# Patient Record
Sex: Male | Born: 1960
Health system: Southern US, Community
[De-identification: ages and names within clinical notes are randomized; demographics above are authoritative.]

## PROBLEM LIST (undated history)

## (undated) DIAGNOSIS — I255 Ischemic cardiomyopathy: Secondary | ICD-10-CM

## (undated) DIAGNOSIS — I10 Essential (primary) hypertension: Secondary | ICD-10-CM

## (undated) DIAGNOSIS — Z951 Presence of aortocoronary bypass graft: Secondary | ICD-10-CM

## (undated) DIAGNOSIS — E785 Hyperlipidemia, unspecified: Secondary | ICD-10-CM

## (undated) DIAGNOSIS — Z720 Tobacco use: Secondary | ICD-10-CM

## (undated) DIAGNOSIS — G4730 Sleep apnea, unspecified: Secondary | ICD-10-CM

## (undated) DIAGNOSIS — I2129 ST elevation (STEMI) myocardial infarction involving other sites: Secondary | ICD-10-CM

## (undated) DIAGNOSIS — I25110 Atherosclerotic heart disease of native coronary artery with unstable angina pectoris: Secondary | ICD-10-CM

## (undated) HISTORY — DX: Sleep apnea, unspecified: G47.30

## (undated) HISTORY — PX: HERNIA REPAIR: SHX51

## (undated) HISTORY — DX: Atherosclerotic heart disease of native coronary artery with unstable angina pectoris: I25.110

## (undated) HISTORY — DX: Ischemic cardiomyopathy: I25.5

## (undated) HISTORY — PX: KNEE SURGERY: SHX244

## (undated) HISTORY — DX: ST elevation (STEMI) myocardial infarction involving other sites: I21.29

## (undated) HISTORY — PX: TRANSTHORACIC ECHOCARDIOGRAM: SHX275

## (undated) HISTORY — DX: Essential (primary) hypertension: I10

## (undated) HISTORY — DX: Presence of aortocoronary bypass graft: Z95.1

---

## 2006-02-15 ENCOUNTER — Ambulatory Visit: Payer: Self-pay | Admitting: Family Medicine

## 2014-03-03 DIAGNOSIS — J309 Allergic rhinitis, unspecified: Secondary | ICD-10-CM | POA: Insufficient documentation

## 2017-03-19 ENCOUNTER — Ambulatory Visit: Payer: Self-pay | Admitting: Emergency Medicine

## 2017-03-23 ENCOUNTER — Encounter: Payer: Self-pay | Admitting: Family

## 2017-03-23 ENCOUNTER — Ambulatory Visit (INDEPENDENT_AMBULATORY_CARE_PROVIDER_SITE_OTHER): Payer: BLUE CROSS/BLUE SHIELD | Admitting: Family

## 2017-03-23 VITALS — BP 202/124 | HR 91 | Temp 98.5°F | Ht 65.0 in | Wt 236.4 lb

## 2017-03-23 DIAGNOSIS — E669 Obesity, unspecified: Secondary | ICD-10-CM

## 2017-03-23 DIAGNOSIS — I10 Essential (primary) hypertension: Secondary | ICD-10-CM

## 2017-03-23 MED ORDER — CLONIDINE HCL 0.1 MG PO TABS
0.2000 mg | ORAL_TABLET | Freq: Once | ORAL | Status: AC
  Administered 2017-03-23: 0.2 mg via ORAL

## 2017-03-23 MED ORDER — LISINOPRIL-HYDROCHLOROTHIAZIDE 20-12.5 MG PO TABS: 1.0000 | ORAL_TABLET | Freq: Every day | ORAL | 3 refills | Status: DC

## 2017-03-23 NOTE — Patient Instructions (Addendum)
 Hypertension Hypertension, commonly called high blood pressure, is when the force of blood pumping through the arteries is too strong. The arteries are the blood vessels that carry blood from the heart throughout the body. Hypertension forces the heart to work harder to pump blood and may cause arteries to become narrow or stiff. Having untreated or uncontrolled hypertension can cause heart attacks, strokes, kidney disease, and other problems. A blood pressure reading consists of a higher number over a lower number. Ideally, your blood pressure should be below 120/80. The first ("top") number is called the systolic pressure. It is a measure of the pressure in your arteries as your heart beats. The second ("bottom") number is called the diastolic pressure. It is a measure of the pressure in your arteries as the heart relaxes. What are the causes? The cause of this condition is not known. What increases the risk? Some risk factors for high blood pressure are under your control. Others are not. Factors you can change  Smoking.  Having type 2 diabetes mellitus, high cholesterol, or both.  Not getting enough exercise or physical activity.  Being overweight.  Having too much fat, sugar, calories, or salt (sodium) in your diet.  Drinking too much alcohol. Factors that are difficult or impossible to change  Having chronic kidney disease.  Having a family history of high blood pressure.  Age. Risk increases with age.  Race. You may be at higher risk if you are African-American.  Gender. Men are at higher risk than women before age 45. After age 65, women are at higher risk than men.  Having obstructive sleep apnea.  Stress. What are the signs or symptoms? Extremely high blood pressure (hypertensive crisis) may cause:  Headache.  Anxiety.  Shortness of breath.  Nosebleed.  Nausea and vomiting.  Severe chest pain.  Jerky movements you cannot control (seizures).  How is  this diagnosed? This condition is diagnosed by measuring your blood pressure while you are seated, with your arm resting on a surface. The cuff of the blood pressure monitor will be placed directly against the skin of your upper arm at the level of your heart. It should be measured at least twice using the same arm. Certain conditions can cause a difference in blood pressure between your right and left arms. Certain factors can cause blood pressure readings to be lower or higher than normal (elevated) for a short period of time:  When your blood pressure is higher when you are in a health care provider's office than when you are at home, this is called white coat hypertension. Most people with this condition do not need medicines.  When your blood pressure is higher at home than when you are in a health care provider's office, this is called masked hypertension. Most people with this condition may need medicines to control blood pressure.  If you have a high blood pressure reading during one visit or you have normal blood pressure with other risk factors:  You may be asked to return on a different day to have your blood pressure checked again.  You may be asked to monitor your blood pressure at home for 1 week or longer.  If you are diagnosed with hypertension, you may have other blood or imaging tests to help your health care provider understand your overall risk for other conditions. How is this treated? This condition is treated by making healthy lifestyle changes, such as eating healthy foods, exercising more, and reducing your alcohol intake.   Your health care provider may prescribe medicine if lifestyle changes are not enough to get your blood pressure under control, and if:  Your systolic blood pressure is above 130.  Your diastolic blood pressure is above 80.  Your personal target blood pressure may vary depending on your medical conditions, your age, and other factors. Follow these  instructions at home: Eating and drinking  Eat a diet that is high in fiber and potassium, and low in sodium, added sugar, and fat. An example eating plan is called the DASH (Dietary Approaches to Stop Hypertension) diet. To eat this way: ? Eat plenty of fresh fruits and vegetables. Try to fill half of your plate at each meal with fruits and vegetables. ? Eat whole grains, such as whole wheat pasta, brown rice, or whole grain bread. Fill about one quarter of your plate with whole grains. ? Eat or drink low-fat dairy products, such as skim milk or low-fat yogurt. ? Avoid fatty cuts of meat, processed or cured meats, and poultry with skin. Fill about one quarter of your plate with lean proteins, such as fish, chicken without skin, beans, eggs, and tofu. ? Avoid premade and processed foods. These tend to be higher in sodium, added sugar, and fat.  Reduce your daily sodium intake. Most people with hypertension should eat less than 1,500 mg of sodium a day.  Limit alcohol intake to no more than 1 drink a day for nonpregnant women and 2 drinks a day for men. One drink equals 12 oz of beer, 5 oz of wine, or 1 oz of hard liquor. Lifestyle  Work with your health care provider to maintain a healthy body weight or to lose weight. Ask what an ideal weight is for you.  Get at least 30 minutes of exercise that causes your heart to beat faster (aerobic exercise) most days of the week. Activities may include walking, swimming, or biking.  Include exercise to strengthen your muscles (resistance exercise), such as pilates or lifting weights, as part of your weekly exercise routine. Try to do these types of exercises for 30 minutes at least 3 days a week.  Do not use any products that contain nicotine or tobacco, such as cigarettes and e-cigarettes. If you need help quitting, ask your health care provider.  Monitor your blood pressure at home as told by your health care provider.  Keep all follow-up visits as  told by your health care provider. This is important. Medicines  Take over-the-counter and prescription medicines only as told by your health care provider. Follow directions carefully. Blood pressure medicines must be taken as prescribed.  Do not skip doses of blood pressure medicine. Doing this puts you at risk for problems and can make the medicine less effective.  Ask your health care provider about side effects or reactions to medicines that you should watch for. Contact a health care provider if:  You think you are having a reaction to a medicine you are taking.  You have headaches that keep coming back (recurring).  You feel dizzy.  You have swelling in your ankles.  You have trouble with your vision. Get help right away if:  You develop a severe headache or confusion.  You have unusual weakness or numbness.  You feel faint.  You have severe pain in your chest or abdomen.  You vomit repeatedly.  You have trouble breathing. Summary  Hypertension is when the force of blood pumping through your arteries is too strong. If this condition is   not controlled, it may put you at risk for serious complications.  Your personal target blood pressure may vary depending on your medical conditions, your age, and other factors. For most people, a normal blood pressure is less than 120/80.  Hypertension is treated with lifestyle changes, medicines, or a combination of both. Lifestyle changes include weight loss, eating a healthy, low-sodium diet, exercising more, and limiting alcohol. This information is not intended to replace advice given to you by your health care provider. Make sure you discuss any questions you have with your health care provider. Document Released: 08/21/2005 Document Revised: 07/19/2016 Document Reviewed: 07/19/2016 Elsevier Interactive Patient Education  2018 [employer]   DASH Eating Plan DASH stands for "Dietary Approaches to Stop Hypertension." The  DASH eating plan is a healthy eating plan that has been shown to reduce high blood pressure (hypertension). It may also reduce your risk for type 2 diabetes, heart disease, and stroke. The DASH eating plan may also help with weight loss. What are tips for following this plan? General guidelines  Avoid eating more than 2,300 mg (milligrams) of salt (sodium) a day. If you have hypertension, you may need to reduce your sodium intake to 1,500 mg a day.  Limit alcohol intake to no more than 1 drink a day for nonpregnant women and 2 drinks a day for men. One drink equals 12 oz of beer, 5 oz of wine, or 1 oz of hard liquor.  Work with your health care provider to maintain a healthy body weight or to lose weight. Ask what an ideal weight is for you.  Get at least 30 minutes of exercise that causes your heart to beat faster (aerobic exercise) most days of the week. Activities may include walking, swimming, or biking.  Work with your health care provider or diet and nutrition specialist (dietitian) to adjust your eating plan to your individual calorie needs. Reading food labels  Check food labels for the amount of sodium per serving. Choose foods with less than 5 percent of the Daily Value of sodium. Generally, foods with less than 300 mg of sodium per serving fit into this eating plan.  To find whole grains, look for the word "whole" as the first word in the ingredient list. Shopping  Buy products labeled as "low-sodium" or "no salt added."  Buy fresh foods. Avoid canned foods and premade or frozen meals. Cooking  Avoid adding salt when cooking. Use salt-free seasonings or herbs instead of table salt or sea salt. Check with your health care provider or pharmacist before using salt substitutes.  Do not fry foods. Cook foods using healthy methods such as baking, boiling, grilling, and broiling instead.  Cook with heart-healthy oils, such as olive, canola, soybean, or sunflower oil. Meal  planning   Eat a balanced diet that includes: ? 5 or more servings of fruits and vegetables each day. At each meal, try to fill half of your plate with fruits and vegetables. ? Up to 6-8 servings of whole grains each day. ? Less than 6 oz of lean meat, poultry, or fish each day. A 3-oz serving of meat is about the same size as a deck of cards. One egg equals 1 oz. ? 2 servings of low-fat dairy each day. ? A serving of nuts, seeds, or beans 5 times each week. ? Heart-healthy fats. Healthy fats called Omega-3 fatty acids are found in foods such as flaxseeds and coldwater fish, like sardines, salmon, and mackerel.  Limit how much you   eat of the following: ? Canned or prepackaged foods. ? Food that is high in trans fat, such as fried foods. ? Food that is high in saturated fat, such as fatty meat. ? Sweets, desserts, sugary drinks, and other foods with added sugar. ? Full-fat dairy products.  Do not salt foods before eating.  Try to eat at least 2 vegetarian meals each week.  Eat more home-cooked food and less restaurant, buffet, and fast food.  When eating at a restaurant, ask that your food be prepared with less salt or no salt, if possible. What foods are recommended? The items listed may not be a complete list. Talk with your dietitian about what dietary choices are best for you. Grains Whole-grain or whole-wheat bread. Whole-grain or whole-wheat pasta. Brown rice. Oatmeal. Quinoa. Bulgur. Whole-grain and low-sodium cereals. Pita bread. Low-fat, low-sodium crackers. Whole-wheat flour tortillas. Vegetables Fresh or frozen vegetables (raw, steamed, roasted, or grilled). Low-sodium or reduced-sodium tomato and vegetable juice. Low-sodium or reduced-sodium tomato sauce and tomato paste. Low-sodium or reduced-sodium canned vegetables. Fruits All fresh, dried, or frozen fruit. Canned fruit in natural juice (without added sugar). Meat and other protein foods Skinless chicken or turkey.  Ground chicken or turkey. Pork with fat trimmed off. Fish and seafood. Egg whites. Dried beans, peas, or lentils. Unsalted nuts, nut butters, and seeds. Unsalted canned beans. Lean cuts of beef with fat trimmed off. Low-sodium, lean deli meat. Dairy Low-fat (1%) or fat-free (skim) milk. Fat-free, low-fat, or reduced-fat cheeses. Nonfat, low-sodium ricotta or cottage cheese. Low-fat or nonfat yogurt. Low-fat, low-sodium cheese. Fats and oils Soft margarine without trans fats. Vegetable oil. Low-fat, reduced-fat, or light mayonnaise and salad dressings (reduced-sodium). Canola, safflower, olive, soybean, and sunflower oils. Avocado. Seasoning and other foods Herbs. Spices. Seasoning mixes without salt. Unsalted popcorn and pretzels. Fat-free sweets. What foods are not recommended? The items listed may not be a complete list. Talk with your dietitian about what dietary choices are best for you. Grains Baked goods made with fat, such as croissants, muffins, or some breads. Dry pasta or rice meal packs. Vegetables Creamed or fried vegetables. Vegetables in a cheese sauce. Regular canned vegetables (not low-sodium or reduced-sodium). Regular canned tomato sauce and paste (not low-sodium or reduced-sodium). Regular tomato and vegetable juice (not low-sodium or reduced-sodium). Pickles. Olives. Fruits Canned fruit in a light or heavy syrup. Fried fruit. Fruit in cream or butter sauce. Meat and other protein foods Fatty cuts of meat. Ribs. Fried meat. Bacon. Sausage. Bologna and other processed lunch meats. Salami. Fatback. Hotdogs. Bratwurst. Salted nuts and seeds. Canned beans with added salt. Canned or smoked fish. Whole eggs or egg yolks. Chicken or turkey with skin. Dairy Whole or 2% milk, cream, and half-and-half. Whole or full-fat cream cheese. Whole-fat or sweetened yogurt. Full-fat cheese. Nondairy creamers. Whipped toppings. Processed cheese and cheese spreads. Fats and oils Butter. Stick  margarine. Lard. Shortening. Ghee. Bacon fat. Tropical oils, such as coconut, palm kernel, or palm oil. Seasoning and other foods Salted popcorn and pretzels. Onion salt, garlic salt, seasoned salt, table salt, and sea salt. Worcestershire sauce. Tartar sauce. Barbecue sauce. Teriyaki sauce. Soy sauce, including reduced-sodium. Steak sauce. Canned and packaged gravies. Fish sauce. Oyster sauce. Cocktail sauce. Horseradish that you find on the shelf. Ketchup. Mustard. Meat flavorings and tenderizers. Bouillon cubes. Hot sauce and Tabasco sauce. Premade or packaged marinades. Premade or packaged taco seasonings. Relishes. Regular salad dressings. Where to find more information:  National Heart, Lung, and Blood Institute: www.nhlbi.nih.gov  American   Heart Association: www.heart.org Summary  The DASH eating plan is a healthy eating plan that has been shown to reduce high blood pressure (hypertension). It may also reduce your risk for type 2 diabetes, heart disease, and stroke.  With the DASH eating plan, you should limit salt (sodium) intake to 2,300 mg a day. If you have hypertension, you may need to reduce your sodium intake to 1,500 mg a day.  When on the DASH eating plan, aim to eat more fresh fruits and vegetables, whole grains, lean proteins, low-fat dairy, and heart-healthy fats.  Work with your health care provider or diet and nutrition specialist (dietitian) to adjust your eating plan to your individual calorie needs. This information is not intended to replace advice given to you by your health care provider. Make sure you discuss any questions you have with your health care provider. Document Released: 08/10/2011 Document Revised: 08/14/2016 Document Reviewed: 08/14/2016 Elsevier Interactive Patient Education  2017 Elsevier Inc.  

## 2017-03-23 NOTE — Progress Notes (Addendum)
Subjective:    Patient ID: Parker Sellers, male    DOB: 1961-07-07, 56 y.o.   MRN: [id number]  Pt presents to the office today to establish care. 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 PT went to the Urgent Care and was told he was getting a rx sent in for his HTN. PT states the only rx he picked up was Lipitor 40 mg. PT states he has been taking this daily, but his BP continues to be elevated.   According to pt's paperwork, Valsartan 80 mg prescription was suppose to be sent to pharmacy. PT states he never got this rx.  Hypertension  This is a recurrent problem. The current episode started more than 1 month ago. The problem has been waxing and waning since onset. The problem is uncontrolled. Associated symptoms include anxiety. Pertinent negatives include no headaches, malaise/fatigue, peripheral edema or shortness of breath. Risk factors for coronary artery disease include family history, obesity and sedentary lifestyle. The current treatment provides mild improvement. There is no history of kidney disease, CAD/MI, CVA or heart failure.      Review of Systems  Constitutional: Negative for malaise/fatigue.  Respiratory: Negative for shortness of breath.   Neurological: Negative for headaches.  All other systems reviewed and are negative.  Family History  Problem Relation Age of Onset  . Cancer Mother   . Hypertension Father   . Liver disease Father   . Alcohol abuse Father   . AAA (abdominal aortic aneurysm) Father     Social History   Social History  . Marital status: Single    Spouse name: N/A  . Number of children: N/A  . Years of education: N/A   Social History Main Topics  . Smoking status: Former Research scientist (life sciences)  . Smokeless tobacco: Current User    Types: Snuff  . Alcohol use 1.2 oz/week    2 Cans of beer per week  . Drug use: No  . Sexual activity: Yes   Other Topics Concern  . None   Social History Narrative  . None      Objective:   Physical Exam  Constitutional: He is oriented to person, place, and time. He appears well-developed and well-nourished. No distress.  HENT:  Head: Normocephalic.  Right Ear: External ear normal.  Left Ear: External ear normal.  Nose: Nose normal.  Mouth/Throat: Oropharynx is clear and moist.  Eyes: Pupils are equal, round, and reactive to light. Right eye exhibits no discharge. Left eye exhibits no discharge.  Neck: Normal range of motion. Neck supple. No thyromegaly present.  Cardiovascular: Normal rate, regular rhythm, normal heart sounds and intact distal pulses.   No murmur heard. Pulmonary/Chest: Effort normal and breath sounds normal. No respiratory distress. He has no wheezes.  Abdominal: Soft. Bowel sounds are normal. He exhibits no distension. There is no tenderness.  Musculoskeletal: Normal range of motion. He exhibits no edema or tenderness.  Neurological: He is alert and oriented to person, place, and time.  Skin: Skin is warm and dry. No rash noted. No erythema.  Psychiatric: He has a normal mood and affect. His behavior is normal. Judgment and thought content normal.  Vitals reviewed.    BP (!) 202/124   Pulse 91   Temp 98.5 F (36.9 C) (Oral)   Ht _0  (1.651 m)   Wt 236 lb 6.4 oz (107.2 kg)   BMI 39.34 kg/m      Assessment &  Plan:  1. Hypertension, unspecified type -Pt started on Zestoretic 20-12.5 mg daily today -Daily blood pressure log given with instructions on how to fill out and told to bring to next visit -Dash diet information given -Exercise encouraged - Stress Management  -Continue current meds -RTO in 2 week  - cloNIDine (CATAPRES) tablet 0.2 mg; Take 2 tablets (0.2 mg total) by mouth once. - lisinopril-hydrochlorothiazide (ZESTORETIC) 20-12.5 MG tablet; Take 1 tablet by mouth daily.  Dispense: 90 tablet; Refill: 3 - BMP8+EGFR  2. Obesity (BMI 30-39.9) - BMP8+EGFR   Pt denies any headache, SOB, chest pain , or edema. Pt  reports he is very anxious at [facility]". We will start Zestoretic today and have pt follow up on Monday. Go to ED with any chest pain, headache, SOB, or edema Pt had negative EKG at [facility], FNP

## 2017-03-24 LAB — BMP8+EGFR
BUN/Creatinine Ratio: 12 (ref 9–20)
BUN: 17 mg/dL (ref 6–24)
CO2: 24 mmol/L (ref 20–29)
Calcium: 9.4 mg/dL (ref 8.7–10.2)
Chloride: 100 mmol/L (ref 96–106)
Creatinine, Ser: 1.42 mg/dL — ABNORMAL HIGH (ref 0.76–1.27)
GFR calc Af Amer: 63 mL/min/{1.73_m2} (ref >59)
GFR calc non Af Amer: 55 mL/min/{1.73_m2} — ABNORMAL LOW (ref >59)
Glucose: 96 mg/dL (ref 65–99)
Potassium: 3.8 mmol/L (ref 3.5–5.2)
Sodium: 139 mmol/L (ref 134–144)

## 2017-03-26 ENCOUNTER — Ambulatory Visit (INDEPENDENT_AMBULATORY_CARE_PROVIDER_SITE_OTHER): Payer: BLUE CROSS/BLUE SHIELD | Admitting: Family

## 2017-03-26 ENCOUNTER — Encounter: Payer: Self-pay | Admitting: Family

## 2017-03-26 DIAGNOSIS — I10 Essential (primary) hypertension: Secondary | ICD-10-CM | POA: Diagnosis not present

## 2017-03-26 MED ORDER — AMLODIPINE BESYLATE 5 MG PO TABS: 5.0000 mg | ORAL_TABLET | Freq: Every day | ORAL | 3 refills | Status: DC

## 2017-03-26 MED ORDER — LISINOPRIL-HYDROCHLOROTHIAZIDE 20-12.5 MG PO TABS: 2.0000 | ORAL_TABLET | Freq: Every day | ORAL | 3 refills | Status: DC

## 2017-03-26 NOTE — Progress Notes (Signed)
   Subjective:    Patient ID: Parker Sellers, male    DOB: 1961-02-26, 56 y.o.   MRN: [id number]  PT presents to the office today to recheck HTN. Pt's BP is still elevated, but slightly improved.  Hypertension  The current episode started more than 1 month ago. The problem is unchanged. The problem is uncontrolled. Associated symptoms include anxiety and malaise/fatigue. Pertinent negatives include no chest pain, peripheral edema or shortness of breath. Past treatments include ACE inhibitors and diuretics. The current treatment provides mild improvement. There is no history of kidney disease, CAD/MI or heart failure.      Review of Systems  Constitutional: Positive for malaise/fatigue.  Respiratory: Negative for shortness of breath.   Cardiovascular: Negative for chest pain.  All other systems reviewed and are negative.      Objective:   Physical Exam  Constitutional: He is oriented to person, place, and time. He appears well-developed and well-nourished. No distress.  HENT:  Head: Normocephalic.  Cardiovascular: Normal rate, regular rhythm, normal heart sounds and intact distal pulses.   No murmur heard. Pulmonary/Chest: Effort normal and breath sounds normal. No respiratory distress. He has no wheezes.  Abdominal: Soft. Bowel sounds are normal. He exhibits no distension. There is no tenderness.  Musculoskeletal: Normal range of motion. He exhibits no edema or tenderness.  Neurological: He is alert and oriented to person, place, and time.  Skin: Skin is warm and dry. No rash noted. No erythema.  Psychiatric: He has a normal mood and affect. His behavior is normal. Judgment and thought content normal.  Vitals reviewed.    BP (!) 198/122   Pulse 83   Temp (!) 97 F (36.1 C) (Oral)   Ht 5\' 5"  (1.651 m)   Wt 236 lb 12.8 oz (107.4 kg)   BMI 39.41 kg/m      Assessment & Plan:  1. Hypertension, unspecified type Increased Zestoretic to 40-25 mg daily from 20-12.5  daily Added Norvasc 5 mg today PT encouraged to get BP machine to monitor BP at home -[facility] [employer] diet information given -Exercise encouraged - Stress Management  -Continue current meds -RTO in 1 week  - lisinopril-hydrochlorothiazide (ZESTORETIC) 20-12.5 MG tablet; Take 2 tablets by mouth daily.  Dispense: 180 tablet; Refill: 3 - amLODipine (NORVASC) 5 MG tablet; Take 1 tablet (5 mg total) by mouth daily.  Dispense: 90 tablet; Refill: 3   PT denies any headache, SOB, chest pain, weakness, or edema at this time and if any of these symptoms arise to got ED.   Jannifer Rodneyhristy Lemoine Goyne, FNP

## 2017-03-26 NOTE — Patient Instructions (Signed)

## 2017-04-02 ENCOUNTER — Encounter: Payer: Self-pay | Admitting: Family

## 2017-04-02 ENCOUNTER — Ambulatory Visit (INDEPENDENT_AMBULATORY_CARE_PROVIDER_SITE_OTHER): Payer: BLUE CROSS/BLUE SHIELD | Admitting: Family

## 2017-04-02 VITALS — BP 140/88 | HR 92 | Temp 97.7°F | Ht 65.0 in | Wt 233.0 lb

## 2017-04-02 DIAGNOSIS — Z0000 Encounter for general adult medical examination without abnormal findings: Secondary | ICD-10-CM

## 2017-04-02 DIAGNOSIS — Z1211 Encounter for screening for malignant neoplasm of colon: Secondary | ICD-10-CM

## 2017-04-02 DIAGNOSIS — E669 Obesity, unspecified: Secondary | ICD-10-CM

## 2017-04-02 DIAGNOSIS — I10 Essential (primary) hypertension: Secondary | ICD-10-CM

## 2017-04-02 MED ORDER — AMLODIPINE BESYLATE 5 MG PO TABS: 5.0000 mg | ORAL_TABLET | Freq: Every day | ORAL | 3 refills | Status: DC

## 2017-04-02 NOTE — Progress Notes (Signed)
   Subjective:    Patient ID: Parker Sellers, male    DOB: Oct 25, 1960, 56 y.o.   MRN: [id number]  Pt presents to the office today to recheck BP and CPE. PT's BP is at goal. PT is getting married Sunday.  Hypertension  This is a chronic problem. The current episode started more than 1 year ago. The problem has been waxing and waning since onset. The problem is controlled. Pertinent negatives include no malaise/fatigue, peripheral edema or shortness of breath. Risk factors for coronary artery disease include dyslipidemia. Past treatments include ACE inhibitors, diuretics and calcium channel blockers. The current treatment provides mild improvement. There is no history of kidney disease, CAD/MI, CVA or heart failure.      Review of Systems  Constitutional: Negative for malaise/fatigue.  Respiratory: Negative for shortness of breath.   All other systems reviewed and are negative.      Objective:   Physical Exam  Constitutional: He is oriented to person, place, and time. He appears well-developed and well-nourished. No distress.  HENT:  Head: Normocephalic.  Cardiovascular: Normal rate, regular rhythm, normal heart sounds and intact distal pulses.   No murmur heard. Pulmonary/Chest: Effort normal and breath sounds normal. No respiratory distress. He has no wheezes.  Abdominal: Soft. Bowel sounds are normal. He exhibits no distension. There is no tenderness.  Musculoskeletal: Normal range of motion. He exhibits no edema or tenderness.  Neurological: He is alert and oriented to person, place, and time.  Skin: Skin is warm and dry. No rash noted. No erythema.  Psychiatric: He has a normal mood and affect. His behavior is normal. Judgment and thought content normal.  Vitals reviewed.   BP 140/88   Pulse 92   Temp 97.7 F (36.5 C) (Oral)   Ht '5\' 5"'$  (1.651 m)   Wt 233 lb (105.7 kg)   BMI 38.77 kg/m      Assessment & Plan:  1. Annual physical exam - CMP14+EGFR - CBC with  Differential/Platelet - Lipid panel - Thyroid Panel With TSH - VITAMIN D 25 Hydroxy (Vit-D Deficiency, Fractures) - PSA, total and free - amLODipine (NORVASC) 5 MG tablet; Take 1 tablet (5 mg total) by mouth daily.  Dispense: 90 tablet; Refill: 3  2. Hypertension, unspecified type - CMP14+EGFR - amLODipine (NORVASC) 5 MG tablet; Take 1 tablet (5 mg total) by mouth daily.  Dispense: 90 tablet; Refill: 3  3. Colon cancer screening - Fecal occult blood, imunochemical; Future  4. Obesity (BMI 30-39.9)   Continue all meds Labs pending Health Maintenance reviewed Diet and exercise encouraged RTO 6 months   Evelina Dun, FNP

## 2017-04-02 NOTE — Patient Instructions (Signed)

## 2017-04-03 ENCOUNTER — Other Ambulatory Visit: Payer: Self-pay | Admitting: Family

## 2017-04-03 LAB — CMP14+EGFR
ALT: 18 IU/L (ref 0–44)
AST: 18 IU/L (ref 0–40)
Albumin/Globulin Ratio: 1.6 (ref 1.2–2.2)
Albumin: 4.8 g/dL (ref 3.5–5.5)
Alkaline Phosphatase: 99 IU/L (ref 39–117)
BUN/Creatinine Ratio: 12 (ref 9–20)
BUN: 15 mg/dL (ref 6–24)
Bilirubin Total: 0.7 mg/dL (ref 0.0–1.2)
CO2: 22 mmol/L (ref 20–29)
Calcium: 9.8 mg/dL (ref 8.7–10.2)
Chloride: 100 mmol/L (ref 96–106)
Creatinine, Ser: 1.3 mg/dL — ABNORMAL HIGH (ref 0.76–1.27)
GFR calc Af Amer: 70 mL/min/{1.73_m2} (ref >59)
GFR calc non Af Amer: 61 mL/min/{1.73_m2} (ref >59)
Globulin, Total: 3 g/dL (ref 1.5–4.5)
Glucose: 97 mg/dL (ref 65–99)
Potassium: 4.2 mmol/L (ref 3.5–5.2)
Sodium: 139 mmol/L (ref 134–144)
Total Protein: 7.8 g/dL (ref 6.0–8.5)

## 2017-04-03 LAB — CBC WITH DIFFERENTIAL/PLATELET
Basophils Absolute: 0 10*3/uL (ref 0.0–0.2)
Basos: 0 %
EOS (ABSOLUTE): 0.2 10*3/uL (ref 0.0–0.4)
Eos: 2 %
Hematocrit: 50.1 % (ref 37.5–51.0)
Hemoglobin: 17.5 g/dL (ref 13.0–17.7)
Immature Grans (Abs): 0 10*3/uL (ref 0.0–0.1)
Immature Granulocytes: 0 %
Lymphocytes Absolute: 1.7 10*3/uL (ref 0.7–3.1)
Lymphs: 18 %
MCH: 31 pg (ref 26.6–33.0)
MCHC: 34.9 g/dL (ref 31.5–35.7)
MCV: 89 fL (ref 79–97)
Monocytes Absolute: 0.7 10*3/uL (ref 0.1–0.9)
Monocytes: 8 %
Neutrophils Absolute: 6.8 10*3/uL (ref 1.4–7.0)
Neutrophils: 72 %
Platelets: 244 10*3/uL (ref 150–379)
RBC: 5.65 x10E6/uL (ref 4.14–5.80)
RDW: 14.3 % (ref 12.3–15.4)
WBC: 9.4 10*3/uL (ref 3.4–10.8)

## 2017-04-03 LAB — LIPID PANEL
Chol/HDL Ratio: 3.7 ratio (ref 0.0–5.0)
Cholesterol, Total: 118 mg/dL (ref 100–199)
HDL: 32 mg/dL — ABNORMAL LOW (ref >39)
LDL Calculated: 68 mg/dL (ref 0–99)
Triglycerides: 91 mg/dL (ref 0–149)
VLDL Cholesterol Cal: 18 mg/dL (ref 5–40)

## 2017-04-03 LAB — THYROID PANEL WITH TSH
Free Thyroxine Index: 2.1 (ref 1.2–4.9)
T3 Uptake Ratio: 26 % (ref 24–39)
T4, Total: 8 ug/dL (ref 4.5–12.0)
TSH: 1.77 u[IU]/mL (ref 0.450–4.500)

## 2017-04-03 LAB — PSA, TOTAL AND FREE
PSA, Free Pct: 58.8 %
PSA, Free: 0.47 ng/mL
Prostate Specific Ag, Serum: 0.8 ng/mL (ref 0.0–4.0)

## 2017-04-03 LAB — VITAMIN D 25 HYDROXY (VIT D DEFICIENCY, FRACTURES): Vit D, 25-Hydroxy: 18.7 ng/mL — ABNORMAL LOW (ref 30.0–100.0)

## 2017-04-03 MED ORDER — VITAMIN D (ERGOCALCIFEROL) 1.25 MG (50000 UNIT) PO CAPS: 50000.0000 [IU] | ORAL_CAPSULE | ORAL | 3 refills | Status: DC

## 2017-05-12 ENCOUNTER — Encounter (HOSPITAL_COMMUNITY): Payer: Self-pay

## 2017-05-12 ENCOUNTER — Encounter (HOSPITAL_COMMUNITY)
Admission: EM | Disposition: A | Discharge status: Home / Self Care | Payer: Self-pay | Source: Home / Self Care | Attending: Thoracic Surgery (Cardiothoracic Vascular Surgery)

## 2017-05-12 ENCOUNTER — Emergency Department (HOSPITAL_COMMUNITY): Payer: BLUE CROSS/BLUE SHIELD

## 2017-05-12 ENCOUNTER — Ambulatory Visit (HOSPITAL_COMMUNITY): Admit: 2017-05-12 | Payer: BLUE CROSS/BLUE SHIELD | Admitting: Cardiology

## 2017-05-12 ENCOUNTER — Inpatient Hospital Stay (HOSPITAL_COMMUNITY)
Admission: EM | Admit: 2017-05-12 | Discharge: 2017-05-18 | DRG: 234 | Disposition: A | Discharge status: Home / Self Care | Payer: BLUE CROSS/BLUE SHIELD | Attending: Thoracic Surgery (Cardiothoracic Vascular Surgery) | Admitting: Thoracic Surgery (Cardiothoracic Vascular Surgery)

## 2017-05-12 DIAGNOSIS — Z9114 Patient's other noncompliance with medication regimen: Secondary | ICD-10-CM | POA: Diagnosis not present

## 2017-05-12 DIAGNOSIS — I252 Old myocardial infarction: Secondary | ICD-10-CM

## 2017-05-12 DIAGNOSIS — Z720 Tobacco use: Secondary | ICD-10-CM

## 2017-05-12 DIAGNOSIS — I161 Hypertensive emergency: Secondary | ICD-10-CM | POA: Diagnosis present

## 2017-05-12 DIAGNOSIS — Z6834 Body mass index (BMI) 34.0-34.9, adult: Secondary | ICD-10-CM | POA: Diagnosis not present

## 2017-05-12 DIAGNOSIS — I255 Ischemic cardiomyopathy: Secondary | ICD-10-CM | POA: Diagnosis not present

## 2017-05-12 DIAGNOSIS — E784 Other hyperlipidemia: Secondary | ICD-10-CM | POA: Diagnosis not present

## 2017-05-12 DIAGNOSIS — I10 Essential (primary) hypertension: Secondary | ICD-10-CM | POA: Diagnosis present

## 2017-05-12 DIAGNOSIS — Z8249 Family history of ischemic heart disease and other diseases of the circulatory system: Secondary | ICD-10-CM | POA: Diagnosis not present

## 2017-05-12 DIAGNOSIS — I213 ST elevation (STEMI) myocardial infarction of unspecified site: Secondary | ICD-10-CM

## 2017-05-12 DIAGNOSIS — Z951 Presence of aortocoronary bypass graft: Secondary | ICD-10-CM

## 2017-05-12 DIAGNOSIS — F17220 Nicotine dependence, chewing tobacco, uncomplicated: Secondary | ICD-10-CM | POA: Diagnosis present

## 2017-05-12 DIAGNOSIS — E669 Obesity, unspecified: Secondary | ICD-10-CM | POA: Diagnosis not present

## 2017-05-12 DIAGNOSIS — Z79899 Other long term (current) drug therapy: Secondary | ICD-10-CM

## 2017-05-12 DIAGNOSIS — I2129 ST elevation (STEMI) myocardial infarction involving other sites: Secondary | ICD-10-CM | POA: Diagnosis present

## 2017-05-12 DIAGNOSIS — Z09 Encounter for follow-up examination after completed treatment for conditions other than malignant neoplasm: Secondary | ICD-10-CM

## 2017-05-12 DIAGNOSIS — I2510 Atherosclerotic heart disease of native coronary artery without angina pectoris: Secondary | ICD-10-CM | POA: Diagnosis present

## 2017-05-12 DIAGNOSIS — Z87891 Personal history of nicotine dependence: Secondary | ICD-10-CM | POA: Diagnosis present

## 2017-05-12 DIAGNOSIS — G4730 Sleep apnea, unspecified: Secondary | ICD-10-CM | POA: Diagnosis present

## 2017-05-12 DIAGNOSIS — E1169 Type 2 diabetes mellitus with other specified complication: Secondary | ICD-10-CM | POA: Diagnosis present

## 2017-05-12 DIAGNOSIS — J9811 Atelectasis: Secondary | ICD-10-CM | POA: Diagnosis present

## 2017-05-12 DIAGNOSIS — I25110 Atherosclerotic heart disease of native coronary artery with unstable angina pectoris: Secondary | ICD-10-CM | POA: Diagnosis not present

## 2017-05-12 DIAGNOSIS — E876 Hypokalemia: Secondary | ICD-10-CM | POA: Diagnosis present

## 2017-05-12 DIAGNOSIS — E785 Hyperlipidemia, unspecified: Secondary | ICD-10-CM | POA: Diagnosis present

## 2017-05-12 HISTORY — PX: LEFT HEART CATH AND CORONARY ANGIOGRAPHY: CATH118249

## 2017-05-12 HISTORY — DX: Tobacco use: Z72.0

## 2017-05-12 HISTORY — DX: ST elevation (STEMI) myocardial infarction involving other sites: I21.29

## 2017-05-12 HISTORY — DX: Ischemic cardiomyopathy: I25.5

## 2017-05-12 HISTORY — DX: Hyperlipidemia, unspecified: E78.5

## 2017-05-12 LAB — I-STAT CHEM 8, ED
BUN: 20 mg/dL (ref 6–20)
Calcium, Ion: 1.2 mmol/L (ref 1.15–1.40)
Chloride: 104 mmol/L (ref 101–111)
Creatinine, Ser: 1.1 mg/dL (ref 0.61–1.24)
Glucose, Bld: 128 mg/dL — ABNORMAL HIGH (ref 65–99)
HCT: 51 % (ref 39.0–52.0)
Hemoglobin: 17.3 g/dL — ABNORMAL HIGH (ref 13.0–17.0)
Potassium: 3.4 mmol/L — ABNORMAL LOW (ref 3.5–5.1)
Sodium: 141 mmol/L (ref 135–145)
TCO2: 25 mmol/L (ref 22–32)

## 2017-05-12 LAB — CBC WITH DIFFERENTIAL/PLATELET
Basophils Absolute: 0 10*3/uL (ref 0.0–0.1)
Basophils Relative: 0 %
Eosinophils Absolute: 0.3 10*3/uL (ref 0.0–0.7)
Eosinophils Relative: 2 %
HCT: 47.7 % (ref 39.0–52.0)
Hemoglobin: 17.2 g/dL — ABNORMAL HIGH (ref 13.0–17.0)
Lymphocytes Relative: 23 %
Lymphs Abs: 2.5 10*3/uL (ref 0.7–4.0)
MCH: 31.3 pg (ref 26.0–34.0)
MCHC: 36.1 g/dL — ABNORMAL HIGH (ref 30.0–36.0)
MCV: 86.7 fL (ref 78.0–100.0)
Monocytes Absolute: 0.6 10*3/uL (ref 0.1–1.0)
Monocytes Relative: 6 %
Neutro Abs: 7.5 10*3/uL (ref 1.7–7.7)
Neutrophils Relative %: 69 %
Platelets: 213 10*3/uL (ref 150–400)
RBC: 5.5 MIL/uL (ref 4.22–5.81)
RDW: 13.5 % (ref 11.5–15.5)
WBC: 10.9 10*3/uL — ABNORMAL HIGH (ref 4.0–10.5)

## 2017-05-12 LAB — CBC
HCT: 48.1 % (ref 39.0–52.0)
Hemoglobin: 17.3 g/dL — ABNORMAL HIGH (ref 13.0–17.0)
MCH: 30.8 pg (ref 26.0–34.0)
MCHC: 36 g/dL (ref 30.0–36.0)
MCV: 85.7 fL (ref 78.0–100.0)
Platelets: 196 10*3/uL (ref 150–400)
RBC: 5.61 MIL/uL (ref 4.22–5.81)
RDW: 13.6 % (ref 11.5–15.5)
WBC: 10.6 10*3/uL — ABNORMAL HIGH (ref 4.0–10.5)

## 2017-05-12 LAB — LIPID PANEL
Cholesterol: 213 mg/dL — ABNORMAL HIGH (ref 0–200)
Cholesterol: 193 mg/dL (ref 0–200)
HDL: 30 mg/dL — ABNORMAL LOW (ref >40)
HDL: 29 mg/dL — ABNORMAL LOW (ref >40)
LDL Cholesterol: 137 mg/dL — ABNORMAL HIGH (ref 0–99)
LDL Cholesterol: 144 mg/dL — ABNORMAL HIGH (ref 0–99)
Total CHOL/HDL Ratio: 7.1 RATIO
Total CHOL/HDL Ratio: 6.7 RATIO
Triglycerides: 231 mg/dL — ABNORMAL HIGH (ref <150)
Triglycerides: 99 mg/dL (ref <150)
VLDL: 46 mg/dL — ABNORMAL HIGH (ref 0–40)
VLDL: 20 mg/dL (ref 0–40)

## 2017-05-12 LAB — COMPREHENSIVE METABOLIC PANEL
ALT: 21 U/L (ref 17–63)
AST: 26 U/L (ref 15–41)
Albumin: 4.8 g/dL (ref 3.5–5.0)
Alkaline Phosphatase: 81 U/L (ref 38–126)
Anion gap: 9 (ref 5–15)
BUN: 18 mg/dL (ref 6–20)
CO2: 25 mmol/L (ref 22–32)
Calcium: 9.7 mg/dL (ref 8.9–10.3)
Chloride: 103 mmol/L (ref 101–111)
Creatinine, Ser: 1.07 mg/dL (ref 0.61–1.24)
GFR calc Af Amer: >60 mL/min (ref >60)
GFR calc non Af Amer: >60 mL/min (ref >60)
Glucose, Bld: 127 mg/dL — ABNORMAL HIGH (ref 65–99)
Potassium: 3.3 mmol/L — ABNORMAL LOW (ref 3.5–5.1)
Sodium: 137 mmol/L (ref 135–145)
Total Bilirubin: 1.1 mg/dL (ref 0.3–1.2)
Total Protein: 8.5 g/dL — ABNORMAL HIGH (ref 6.5–8.1)

## 2017-05-12 LAB — BASIC METABOLIC PANEL
Anion gap: 8 (ref 5–15)
BUN: 14 mg/dL (ref 6–20)
CO2: 25 mmol/L (ref 22–32)
Calcium: 9.5 mg/dL (ref 8.9–10.3)
Chloride: 104 mmol/L (ref 101–111)
Creatinine, Ser: 1.05 mg/dL (ref 0.61–1.24)
GFR calc Af Amer: >60 mL/min (ref >60)
GFR calc non Af Amer: >60 mL/min (ref >60)
Glucose, Bld: 118 mg/dL — ABNORMAL HIGH (ref 65–99)
Potassium: 3.3 mmol/L — ABNORMAL LOW (ref 3.5–5.1)
Sodium: 137 mmol/L (ref 135–145)

## 2017-05-12 LAB — HEMOGLOBIN A1C
Hgb A1c MFr Bld: 5.2 % (ref 4.8–5.6)
Mean Plasma Glucose: 102.54 mg/dL

## 2017-05-12 LAB — TROPONIN I
Troponin I: 0.19 ng/mL (ref <0.03)
Troponin I: 4.67 ng/mL (ref <0.03)

## 2017-05-12 LAB — PROTIME-INR
INR: 1.07
Prothrombin Time: 13.8 seconds (ref 11.4–15.2)

## 2017-05-12 LAB — I-STAT TROPONIN, ED: Troponin i, poc: 0.22 ng/mL (ref 0.00–0.08)

## 2017-05-12 LAB — APTT: aPTT: 26 seconds (ref 24–36)

## 2017-05-12 LAB — HEPARIN LEVEL (UNFRACTIONATED): Heparin Unfractionated: <0.1 IU/mL — ABNORMAL LOW (ref 0.30–0.70)

## 2017-05-12 SURGERY — LEFT HEART CATH AND CORONARY ANGIOGRAPHY
Anesthesia: LOCAL

## 2017-05-12 MED ORDER — ACETAMINOPHEN 325 MG PO TABS
650.0000 mg | ORAL_TABLET | ORAL | Status: DC | PRN
  Administered 2017-05-12: 650 mg via ORAL
  Filled 2017-05-12: qty 2

## 2017-05-12 MED ORDER — IOPAMIDOL (ISOVUE-370) INJECTION 76%
INTRAVENOUS | Status: AC
  Filled 2017-05-12: qty 50

## 2017-05-12 MED ORDER — HEPARIN SODIUM (PORCINE) 5000 UNIT/ML IJ SOLN
INTRAMUSCULAR | Status: AC
  Filled 2017-05-12: qty 1

## 2017-05-12 MED ORDER — SODIUM CHLORIDE 0.9 % IV SOLN: INTRAVENOUS | Status: DC

## 2017-05-12 MED ORDER — HEPARIN SODIUM (PORCINE) 1000 UNIT/ML IJ SOLN
INTRAMUSCULAR | Status: AC
  Filled 2017-05-12: qty 1

## 2017-05-12 MED ORDER — SODIUM CHLORIDE 0.9 % IV SOLN: 250.0000 mL | INTRAVENOUS | Status: DC | PRN

## 2017-05-12 MED ORDER — POTASSIUM CHLORIDE CRYS ER 20 MEQ PO TBCR
40.0000 meq | EXTENDED_RELEASE_TABLET | Freq: Once | ORAL | Status: AC
  Administered 2017-05-12: 40 meq via ORAL
  Filled 2017-05-12: qty 2

## 2017-05-12 MED ORDER — HYDROCHLOROTHIAZIDE 25 MG PO TABS
25.0000 mg | ORAL_TABLET | Freq: Every day | ORAL | Status: DC
  Administered 2017-05-12: 25 mg via ORAL
  Filled 2017-05-12: qty 1

## 2017-05-12 MED ORDER — VERAPAMIL HCL 2.5 MG/ML IV SOLN
INTRAVENOUS | Status: DC | PRN
  Administered 2017-05-12: 8 mL via INTRA_ARTERIAL

## 2017-05-12 MED ORDER — MIDAZOLAM HCL 2 MG/2ML IJ SOLN
INTRAMUSCULAR | Status: DC | PRN
  Administered 2017-05-12: 2 mg via INTRAVENOUS

## 2017-05-12 MED ORDER — ASPIRIN 81 MG PO CHEW
324.0000 mg | CHEWABLE_TABLET | Freq: Once | ORAL | Status: AC
  Administered 2017-05-12: 324 mg via ORAL

## 2017-05-12 MED ORDER — ASPIRIN 81 MG PO CHEW
81.0000 mg | CHEWABLE_TABLET | Freq: Every day | ORAL | Status: DC
  Administered 2017-05-12 – 2017-05-13 (×2): 81 mg via ORAL
  Filled 2017-05-12 (×2): qty 1

## 2017-05-12 MED ORDER — SODIUM CHLORIDE 0.9% FLUSH: 3.0000 mL | INTRAVENOUS | Status: DC | PRN

## 2017-05-12 MED ORDER — LISINOPRIL 40 MG PO TABS
40.0000 mg | ORAL_TABLET | Freq: Every day | ORAL | Status: DC
  Administered 2017-05-12 – 2017-05-13 (×2): 40 mg via ORAL
  Filled 2017-05-12 (×2): qty 1

## 2017-05-12 MED ORDER — NITROGLYCERIN 0.4 MG SL SUBL
0.4000 mg | SUBLINGUAL_TABLET | Freq: Once | SUBLINGUAL | Status: AC
  Administered 2017-05-12: 0.4 mg via SUBLINGUAL

## 2017-05-12 MED ORDER — NITROGLYCERIN IN D5W 200-5 MCG/ML-% IV SOLN
INTRAVENOUS | Status: AC | PRN
  Administered 2017-05-12: 10 ug/min via INTRAVENOUS

## 2017-05-12 MED ORDER — ASPIRIN 81 MG PO CHEW
CHEWABLE_TABLET | ORAL | Status: AC
  Filled 2017-05-12: qty 4

## 2017-05-12 MED ORDER — HEPARIN (PORCINE) IN NACL 100-0.45 UNIT/ML-% IJ SOLN
1900.0000 [IU]/h | INTRAMUSCULAR | Status: DC
  Administered 2017-05-12: 1250 [IU]/h via INTRAVENOUS
  Administered 2017-05-13: 1850 [IU]/h via INTRAVENOUS
  Administered 2017-05-13: 1800 [IU]/h via INTRAVENOUS
  Administered 2017-05-13: 1900 [IU]/h via INTRAVENOUS
  Filled 2017-05-12 (×3): qty 250

## 2017-05-12 MED ORDER — HEPARIN (PORCINE) IN NACL 2-0.9 UNIT/ML-% IJ SOLN
INTRAMUSCULAR | Status: AC | PRN
  Administered 2017-05-12: 1000 mL

## 2017-05-12 MED ORDER — MIDAZOLAM HCL 2 MG/2ML IJ SOLN
INTRAMUSCULAR | Status: AC
  Filled 2017-05-12: qty 2

## 2017-05-12 MED ORDER — CARVEDILOL 6.25 MG PO TABS
6.2500 mg | ORAL_TABLET | Freq: Two times a day (BID) | ORAL | Status: DC
  Administered 2017-05-12: 6.25 mg via ORAL
  Filled 2017-05-12: qty 1

## 2017-05-12 MED ORDER — LIDOCAINE HCL (PF) 1 % IJ SOLN
INTRAMUSCULAR | Status: AC
  Filled 2017-05-12: qty 30

## 2017-05-12 MED ORDER — METOPROLOL TARTRATE 5 MG/5ML IV SOLN
5.0000 mg | Freq: Once | INTRAVENOUS | Status: AC
  Administered 2017-05-12: 5 mg via INTRAVENOUS

## 2017-05-12 MED ORDER — ONDANSETRON HCL 4 MG/2ML IJ SOLN: 4.0000 mg | Freq: Four times a day (QID) | INTRAMUSCULAR | Status: DC | PRN

## 2017-05-12 MED ORDER — SODIUM CHLORIDE 0.9 % IV SOLN
INTRAVENOUS | Status: AC
  Administered 2017-05-12: 05:00:00 via INTRAVENOUS

## 2017-05-12 MED ORDER — FENTANYL CITRATE (PF) 100 MCG/2ML IJ SOLN
INTRAMUSCULAR | Status: AC
  Filled 2017-05-12: qty 2

## 2017-05-12 MED ORDER — HEPARIN (PORCINE) IN NACL 2-0.9 UNIT/ML-% IJ SOLN
INTRAMUSCULAR | Status: AC
  Filled 2017-05-12: qty 1000

## 2017-05-12 MED ORDER — LISINOPRIL-HYDROCHLOROTHIAZIDE 20-12.5 MG PO TABS: 2.0000 | ORAL_TABLET | Freq: Every day | ORAL | Status: DC

## 2017-05-12 MED ORDER — LIDOCAINE HCL (PF) 1 % IJ SOLN
INTRAMUSCULAR | Status: DC | PRN
  Administered 2017-05-12: 2 mL

## 2017-05-12 MED ORDER — SODIUM CHLORIDE 0.9 % IV SOLN
INTRAVENOUS | Status: AC | PRN
  Administered 2017-05-12: 100 mL/h via INTRAVENOUS

## 2017-05-12 MED ORDER — HYDRALAZINE HCL 20 MG/ML IJ SOLN
INTRAMUSCULAR | Status: AC
  Filled 2017-05-12: qty 1

## 2017-05-12 MED ORDER — ATORVASTATIN CALCIUM 80 MG PO TABS
80.0000 mg | ORAL_TABLET | Freq: Every day | ORAL | Status: DC
  Administered 2017-05-12 – 2017-05-17 (×5): 80 mg via ORAL
  Filled 2017-05-12 (×5): qty 1

## 2017-05-12 MED ORDER — NITROGLYCERIN IN D5W 200-5 MCG/ML-% IV SOLN: 5.0000 ug/min | Freq: Once | INTRAVENOUS | Status: DC

## 2017-05-12 MED ORDER — HEPARIN SODIUM (PORCINE) 1000 UNIT/ML IJ SOLN
INTRAMUSCULAR | Status: DC | PRN
  Administered 2017-05-12: 3000 [IU] via INTRAVENOUS
  Administered 2017-05-12: 4000 [IU] via INTRAVENOUS

## 2017-05-12 MED ORDER — OXYCODONE HCL 5 MG PO TABS: 5.0000 mg | ORAL_TABLET | ORAL | Status: DC | PRN

## 2017-05-12 MED ORDER — NITROGLYCERIN 0.4 MG SL SUBL
SUBLINGUAL_TABLET | SUBLINGUAL | Status: AC
  Administered 2017-05-12: 0.4 mg via SUBLINGUAL
  Filled 2017-05-12: qty 1

## 2017-05-12 MED ORDER — SODIUM CHLORIDE 0.9% FLUSH
3.0000 mL | Freq: Two times a day (BID) | INTRAVENOUS | Status: DC
  Administered 2017-05-12 – 2017-05-13 (×2): 3 mL via INTRAVENOUS

## 2017-05-12 MED ORDER — FENTANYL CITRATE (PF) 100 MCG/2ML IJ SOLN
INTRAMUSCULAR | Status: DC | PRN
  Administered 2017-05-12: 50 ug via INTRAVENOUS

## 2017-05-12 MED ORDER — HEPARIN SODIUM (PORCINE) 5000 UNIT/ML IJ SOLN
4000.0000 [IU] | Freq: Once | INTRAMUSCULAR | Status: AC
  Administered 2017-05-12: 4000 [IU] via INTRAVENOUS

## 2017-05-12 MED ORDER — HEPARIN (PORCINE) IN NACL 100-0.45 UNIT/ML-% IJ SOLN
INTRAMUSCULAR | Status: AC
  Filled 2017-05-12: qty 250

## 2017-05-12 MED ORDER — VERAPAMIL HCL 2.5 MG/ML IV SOLN
INTRAVENOUS | Status: AC
  Filled 2017-05-12: qty 2

## 2017-05-12 MED ORDER — AMLODIPINE BESYLATE 5 MG PO TABS
5.0000 mg | ORAL_TABLET | Freq: Every day | ORAL | Status: DC
  Administered 2017-05-12 – 2017-05-13 (×2): 5 mg via ORAL
  Filled 2017-05-12 (×2): qty 1

## 2017-05-12 MED ORDER — CARVEDILOL 3.125 MG PO TABS
3.1250 mg | ORAL_TABLET | Freq: Two times a day (BID) | ORAL | Status: DC
  Administered 2017-05-12: 3.125 mg via ORAL
  Filled 2017-05-12: qty 1

## 2017-05-12 MED ORDER — HEPARIN SOD (PORK) LOCK FLUSH 100 UNIT/ML IV SOLN
INTRAVENOUS | Status: AC
  Filled 2017-05-12: qty 5

## 2017-05-12 MED ORDER — MORPHINE SULFATE (PF) 2 MG/ML IV SOLN: 2.0000 mg | INTRAVENOUS | Status: DC | PRN

## 2017-05-12 MED ORDER — HYDRALAZINE HCL 20 MG/ML IJ SOLN
INTRAMUSCULAR | Status: DC | PRN
  Administered 2017-05-12 (×2): 10 mg via INTRAVENOUS

## 2017-05-12 MED ORDER — IOPAMIDOL (ISOVUE-370) INJECTION 76%
INTRAVENOUS | Status: AC
  Filled 2017-05-12: qty 125

## 2017-05-12 MED ORDER — NITROGLYCERIN IN D5W 200-5 MCG/ML-% IV SOLN
INTRAVENOUS | Status: AC
  Filled 2017-05-12: qty 250

## 2017-05-12 MED ORDER — IOPAMIDOL (ISOVUE-370) INJECTION 76%
INTRAVENOUS | Status: DC | PRN
  Administered 2017-05-12: 160 mL via INTRA_ARTERIAL

## 2017-05-12 MED ORDER — FUROSEMIDE 10 MG/ML IJ SOLN
40.0000 mg | Freq: Once | INTRAMUSCULAR | Status: AC
  Administered 2017-05-12: 40 mg via INTRAVENOUS
  Filled 2017-05-12: qty 4

## 2017-05-12 MED ORDER — METOPROLOL TARTRATE 5 MG/5ML IV SOLN
INTRAVENOUS | Status: AC
  Administered 2017-05-12: 5 mg via INTRAVENOUS
  Filled 2017-05-12: qty 5

## 2017-05-12 SURGICAL SUPPLY — 17 items
CATH HEARTRAIL IKARI 6F IR1.0 (CATHETERS) ×2 IMPLANT
CATH INFINITI 5 FR 3DRC (CATHETERS) ×2 IMPLANT
CATH INFINITI 5FR ANG PIGTAIL (CATHETERS) ×2 IMPLANT
CATH OPTITORQUE TIG 4.0 5F (CATHETERS) ×2 IMPLANT
CATHETER LAUNCHER 6FR NOTO (CATHETERS) ×2 IMPLANT
DEVICE RAD COMP TR BAND LRG (VASCULAR PRODUCTS) ×2 IMPLANT
ELECT DEFIB PAD ADLT CADENCE (PAD) ×2 IMPLANT
GLIDESHEATH SLEND A-KIT 6F 22G (SHEATH) ×2 IMPLANT
GUIDEWIRE INQWIRE 1.5J.035X260 (WIRE) ×1 IMPLANT
INQWIRE 1.5J .035X260CM (WIRE) ×2
KIT ENCORE 26 ADVANTAGE (KITS) ×2 IMPLANT
KIT HEART LEFT (KITS) ×2 IMPLANT
PACK CARDIAC CATHETERIZATION (CUSTOM PROCEDURE TRAY) ×2 IMPLANT
SYR MEDRAD MARK V 150ML (SYRINGE) ×2 IMPLANT
TRANSDUCER W/STOPCOCK (MISCELLANEOUS) ×2 IMPLANT
TUBING CIL FLEX 10 FLL-RA (TUBING) ×2 IMPLANT
VALVE GUARDIAN II ~~LOC~~ HEMO (MISCELLANEOUS) ×2 IMPLANT

## 2017-05-12 NOTE — Progress Notes (Addendum)
Progress Note  Patient Name: Parker Sellers Date of Encounter: 05/12/2017  Primary Cardiologist: Herbie BaltimoreHarding  Subjective   Feeling better, no active chest pain, no shortness of breath, laying in bed. Multiple family members in room.  Inpatient Medications    Scheduled Meds: . amLODipine  5 mg Oral Daily  . aspirin  81 mg Oral Daily  . atorvastatin  80 mg Oral q1800  . carvedilol  3.125 mg Oral BID WC  . heparin      . lisinopril  40 mg Oral Daily   And  . hydrochlorothiazide  25 mg Oral Daily  . sodium chloride flush  3 mL Intravenous Q12H   Continuous Infusions: . sodium chloride    . heparin    . heparin    . nitroGLYCERIN Stopped (05/12/17 0447)   PRN Meds: sodium chloride, acetaminophen, morphine injection, ondansetron (ZOFRAN) IV, oxyCODONE, sodium chloride flush   Vital Signs    Vitals:   05/12/17 0600 05/12/17 0700 05/12/17 0800 05/12/17 0900  BP: 140/89  (!) 145/97   Pulse: 72 63 66 63  Resp: 20 20 20 18   Temp:   98.2 F (36.8 C)   TempSrc:   Oral   SpO2:   98%   Weight:      Height:        Intake/Output Summary (Last 24 hours) at 05/12/17 1056 Last data filed at 05/12/17 0900  Gross per 24 hour  Intake              680 ml  Output             1801 ml  Net            -1121 ml   Filed Weights   05/12/17 0201 05/12/17 0420  Weight: 235 lb (106.6 kg) 228 lb 12.8 oz (103.8 kg)    Telemetry    Currently sinus rhythm- Personally Reviewed  ECG    Sinus rhythm with lateral ST segment depression - Personally Reviewed  Physical Exam   GEN: No acute distress.  Obese Neck: No JVD Cardiac: RRR, no murmurs, rubs, or gallops.  Respiratory: Clear to auscultation bilaterally. GI: Soft, nontender, non-distended  MS: No edema; No deformity. Catheterization site intact Neuro:  Nonfocal  Psych: Normal affect   Labs    Chemistry Recent Labs Lab 05/12/17 0204 05/12/17 0208 05/12/17 0747  NA 137 141 137  K 3.3* 3.4* 3.3*  CL 103 104 104  CO2  25  --  25  GLUCOSE 127* 128* 118*  BUN 18 20 14   CREATININE 1.07 1.10 1.05  CALCIUM 9.7  --  9.5  PROT 8.5*  --   --   ALBUMIN 4.8  --   --   AST 26  --   --   ALT 21  --   --   ALKPHOS 81  --   --   BILITOT 1.1  --   --   GFRNONAA >60  --  >60  GFRAA >60  --  >60  ANIONGAP 9  --  8     Hematology Recent Labs Lab 05/12/17 0204 05/12/17 0208 05/12/17 0747  WBC 10.9*  --  10.6*  RBC 5.50  --  5.61  HGB 17.2* 17.3* 17.3*  HCT 47.7 51.0 48.1  MCV 86.7  --  85.7  MCH 31.3  --  30.8  MCHC 36.1*  --  36.0  RDW 13.5  --  13.6  PLT 213  --  196    Cardiac Enzymes Recent Labs Lab 05/12/17 0204  TROPONINI 0.19*    Recent Labs Lab 05/12/17 0204  TROPIPOC 0.22*     BNPNo results for input(s): BNP, PROBNP in the last 168 hours.   DDimer No results for input(s): DDIMER in the last 168 hours.   Radiology    No results found.  Cardiac Studies   Cardiac catheterization report reviewed  Patient Profile     56 y.o. male with multivessel coronary artery disease, 55% left main, lateral STEMI post-angiography by Dr. Herbie Baltimore, EF 40%  Assessment & Plan    Lateral ST elevation myocardial infarction  - IV heparin, aspirin, beta blocker, ACE inhibitor, statin  - Consulting cardiothoracic surgery for discussion of possible revascularization  - He is now off of nitroglycerin IV  - Increasing carvedilol to 6.25 mg twice a day  - Checking another troponin level, previous mildly elevated 0.2  - Echocardiogram  Hypertensive emergency  - Blood pressure under much better control currently. Off of nitroglycerin drip.  - Likely in part anxiety driven in the setting of his chest pain/cardiac catheterization  Coronary artery disease  - Dr. Herbie Baltimore documented left main of 55%. Right coronary artery seems to be significant vessel as well. Please see his catheterization report for full details  Morbid obesity  - BMI greater than 35 with hypertension, hyperlipidemia, CAD  -  Continue to encourage weight loss  Hyperlipidemia  -High intensity statin therapy with atorvastatin 80 mg begun  Hypokalemia  - We will replete.  As of interest, he was married one month ago, wife in room.  Critical care time 35 minutes spent with extensive data review, review of angiogram, discussion with nursing team, family discussion in this gentleman with ST elevation myocardial infarction who is at increased cardiovascular risk.  For questions or updates, please contact [facility] Please consult [email or web address] for contact info under Cardiology/STEMI. Daytime calls, contact the Day Call APP (6a-8a) or assigned team (Teams A-D) provider (7:30a - 5p). All other daytime calls (7:30-5p), contact the Card Master @ [phone].   Nighttime calls, contact the assigned APP (5p-8p) or MD (6:30p-8p). Overnight calls (8p-6a), contact the on call Fellow @ [phone].      Signed, Donato Schultz, MD  05/12/2017, 10:56 AM

## 2017-05-12 NOTE — Consult Note (Signed)
Reason for Consult:Left main/ 3 vessel CAD Referring Physician: Dr. Jerolyn Center is an 56 y.o. male.  HPI: 56 yo man who presented with a cc/o CP  Mr. Parker Sellers is a 56 yo man with a past history of hypertension, hyperlipidemia, obesity, tobacco abuse and a family history of CAD, but no prior cardiac history. He was playing cards about 10 O'clock last night when he note onset of substernal chest pain. He initially thought it was indigestion and went to lay down. It eased off temporarily but recurred soon after now with radiation to his jaw. + diaphoresis. He went to the [facility] ED and was noted to have ST elevation. He was sent to [facility] for emergency cath. His pain and ST elevation improved prior to the cath. At cath he was found to have left main and 3 vessel CAD. Currently pain free.  Past Medical History:  Diagnosis Date  . Hyperlipidemia   . Hypertension   . Sleep apnea   . Tobacco use     Past Surgical History:  Procedure Laterality Date  . HERNIA REPAIR    . KNEE SURGERY Left    Torn meniscus    Family History  Problem Relation Age of Onset  . Cancer Mother   . Hypertension Father   . Liver disease Father   . Alcohol abuse Father   . AAA (abdominal aortic aneurysm) Father   . Coronary artery disease Brother        recent stent placement    Social History:  reports that he has quit smoking. His smokeless tobacco use includes Snuff and Chew. He reports that he drinks about 1.2 oz of alcohol per week . He reports that he does not use drugs.  Allergies: No Known Allergies  Medications:  Scheduled: . amLODipine  5 mg Oral Daily  . aspirin  81 mg Oral Daily  . atorvastatin  80 mg Oral q1800  . carvedilol  6.25 mg Oral BID WC  . lisinopril  40 mg Oral Daily   And  . hydrochlorothiazide  25 mg Oral Daily  . potassium chloride  40 mEq Oral Once  . sodium chloride flush  3 mL Intravenous Q12H    Results for orders placed or performed during the hospital  encounter of 05/12/17 (from the past 48 hour(s))  CBC with Differential/Platelet     Status: Abnormal   Collection Time: 05/12/17  2:04 AM  Result Value Ref Range   WBC 10.9 (H) 4.0 - 10.5 K/uL   RBC 5.50 4.22 - 5.81 MIL/uL   Hemoglobin 17.2 (H) 13.0 - 17.0 g/dL   HCT 47.7 39.0 - 52.0 %   MCV 86.7 78.0 - 100.0 fL   MCH 31.3 26.0 - 34.0 pg   MCHC 36.1 (H) 30.0 - 36.0 g/dL   RDW 13.5 11.5 - 15.5 %   Platelets 213 150 - 400 K/uL   Neutrophils Relative % 69 %   Neutro Abs 7.5 1.7 - 7.7 K/uL   Lymphocytes Relative 23 %   Lymphs Abs 2.5 0.7 - 4.0 K/uL   Monocytes Relative 6 %   Monocytes Absolute 0.6 0.1 - 1.0 K/uL   Eosinophils Relative 2 %   Eosinophils Absolute 0.3 0.0 - 0.7 K/uL   Basophils Relative 0 %   Basophils Absolute 0.0 0.0 - 0.1 K/uL  Protime-INR     Status: None   Collection Time: 05/12/17  2:04 AM  Result Value Ref Range   Prothrombin Time  13.8 11.4 - 15.2 seconds   INR 1.07   APTT     Status: None   Collection Time: 05/12/17  2:04 AM  Result Value Ref Range   aPTT 26 24 - 36 seconds  Comprehensive metabolic panel     Status: Abnormal   Collection Time: 05/12/17  2:04 AM  Result Value Ref Range   Sodium 137 135 - 145 mmol/L   Potassium 3.3 (L) 3.5 - 5.1 mmol/L   Chloride 103 101 - 111 mmol/L   CO2 25 22 - 32 mmol/L   Glucose, Bld 127 (H) 65 - 99 mg/dL   BUN 18 6 - 20 mg/dL   Creatinine, Ser 1.07 0.61 - 1.24 mg/dL   Calcium 9.7 8.9 - 10.3 mg/dL   Total Protein 8.5 (H) 6.5 - 8.1 g/dL   Albumin 4.8 3.5 - 5.0 g/dL   AST 26 15 - 41 U/L   ALT 21 17 - 63 U/L   Alkaline Phosphatase 81 38 - 126 U/L   Total Bilirubin 1.1 0.3 - 1.2 mg/dL   GFR calc non Af Amer >60 >60 mL/min   GFR calc Af Amer >60 >60 mL/min    Comment: (NOTE) The eGFR has been calculated using the CKD EPI equation. This calculation has not been validated in all clinical situations. eGFR's persistently <60 mL/min signify possible Chronic Kidney Disease.    Anion gap 9 5 - 15  Troponin I      Status: Abnormal   Collection Time: 05/12/17  2:04 AM  Result Value Ref Range   Troponin I 0.19 (HH) <0.03 ng/mL    Comment: CRITICAL RESULT CALLED TO, READ BACK BY AND VERIFIED WITH:  BELTON,C @ 0245 ON 05/12/17 BY JUW   Lipid panel     Status: Abnormal   Collection Time: 05/12/17  2:04 AM  Result Value Ref Range   Cholesterol 213 (H) 0 - 200 mg/dL   Triglycerides 231 (H) <150 mg/dL   HDL 30 (L) >40 mg/dL   Total CHOL/HDL Ratio 7.1 RATIO   VLDL 46 (H) 0 - 40 mg/dL   LDL Cholesterol 137 (H) 0 - 99 mg/dL    Comment:        Total Cholesterol/HDL:CHD Risk Coronary Heart Disease Risk Table                     Men   Women  1/2 Average Risk   3.4   3.3  Average Risk       5.0   4.4  2 X Average Risk   9.6   7.1  3 X Average Risk  23.4   11.0        Use the calculated Patient Ratio above and the CHD Risk Table to determine the patient's CHD Risk.        ATP III CLASSIFICATION (LDL):  <100     mg/dL   Optimal  100-129  mg/dL   Near or Above                    Optimal  130-159  mg/dL   Borderline  160-189  mg/dL   High  >190     mg/dL   Very High   I-stat troponin, ED     Status: Abnormal   Collection Time: 05/12/17  2:04 AM  Result Value Ref Range   Troponin i, poc 0.22 (HH) 0.00 - 0.08 ng/mL   Comment NOTIFIED PHYSICIAN    Comment 3  Comment: Due to the release kinetics of cTnI, a negative result within the first hours of the onset of symptoms does not rule out myocardial infarction with certainty. If myocardial infarction is still suspected, repeat the test at appropriate intervals.   I-stat chem 8, ed     Status: Abnormal   Collection Time: 05/12/17  2:08 AM  Result Value Ref Range   Sodium 141 135 - 145 mmol/L   Potassium 3.4 (L) 3.5 - 5.1 mmol/L   Chloride 104 101 - 111 mmol/L   BUN 20 6 - 20 mg/dL   Creatinine, Ser 1.10 0.61 - 1.24 mg/dL   Glucose, Bld 128 (H) 65 - 99 mg/dL   Calcium, Ion 1.20 1.15 - 1.40 mmol/L   TCO2 25 22 - 32 mmol/L   Hemoglobin  17.3 (H) 13.0 - 17.0 g/dL   HCT 51.0 39.0 - 52.0 %  CBC     Status: Abnormal   Collection Time: 05/12/17  7:47 AM  Result Value Ref Range   WBC 10.6 (H) 4.0 - 10.5 K/uL   RBC 5.61 4.22 - 5.81 MIL/uL   Hemoglobin 17.3 (H) 13.0 - 17.0 g/dL   HCT 48.1 39.0 - 52.0 %   MCV 85.7 78.0 - 100.0 fL   MCH 30.8 26.0 - 34.0 pg   MCHC 36.0 30.0 - 36.0 g/dL   RDW 13.6 11.5 - 15.5 %   Platelets 196 150 - 400 K/uL  Basic metabolic panel     Status: Abnormal   Collection Time: 05/12/17  7:47 AM  Result Value Ref Range   Sodium 137 135 - 145 mmol/L   Potassium 3.3 (L) 3.5 - 5.1 mmol/L   Chloride 104 101 - 111 mmol/L   CO2 25 22 - 32 mmol/L   Glucose, Bld 118 (H) 65 - 99 mg/dL   BUN 14 6 - 20 mg/dL   Creatinine, Ser 1.05 0.61 - 1.24 mg/dL   Calcium 9.5 8.9 - 10.3 mg/dL   GFR calc non Af Amer >60 >60 mL/min   GFR calc Af Amer >60 >60 mL/min    Comment: (NOTE) The eGFR has been calculated using the CKD EPI equation. This calculation has not been validated in all clinical situations. eGFR's persistently <60 mL/min signify possible Chronic Kidney Disease.    Anion gap 8 5 - 15  Troponin I     Status: Abnormal   Collection Time: 05/12/17 11:50 AM  Result Value Ref Range   Troponin I 4.67 (HH) <0.03 ng/mL    Comment: CRITICAL RESULT CALLED TO, READ BACK BY AND VERIFIED WITH: M WEST,RN 1344 05/12/17 D BRADLEY   Hemoglobin A1c     Status: None   Collection Time: 05/12/17 11:50 AM  Result Value Ref Range   Hgb A1c MFr Bld 5.2 4.8 - 5.6 %    Comment: (NOTE) Pre diabetes:          5.7%-6.4% Diabetes:              >6.4% Glycemic control for   <7.0% adults with diabetes    Mean Plasma Glucose 102.54 mg/dL  Lipid panel     Status: Abnormal   Collection Time: 05/12/17 11:50 AM  Result Value Ref Range   Cholesterol 193 0 - 200 mg/dL   Triglycerides 99 <150 mg/dL   HDL 29 (L) >40 mg/dL   Total CHOL/HDL Ratio 6.7 RATIO   VLDL 20 0 - 40 mg/dL   LDL Cholesterol 144 (H) 0 - 99 mg/dL  Comment:         Total Cholesterol/HDL:CHD Risk Coronary Heart Disease Risk Table                     Men   Women  1/2 Average Risk   3.4   3.3  Average Risk       5.0   4.4  2 X Average Risk   9.6   7.1  3 X Average Risk  23.4   11.0        Use the calculated Patient Ratio above and the CHD Risk Table to determine the patient's CHD Risk.        ATP III CLASSIFICATION (LDL):  <100     mg/dL   Optimal  100-129  mg/dL   Near or Above                    Optimal  130-159  mg/dL   Borderline  160-189  mg/dL   High  >190     mg/dL   Very High     No results found.  Review of Systems  Constitutional: Positive for diaphoresis and malaise/fatigue.  Eyes: Negative for blurred vision and double vision.  Respiratory: Positive for shortness of breath. Negative for wheezing.   Cardiovascular: Positive for chest pain. Negative for claudication and leg swelling.  Gastrointestinal: Positive for heartburn and nausea. Negative for vomiting.  Genitourinary: Negative for dysuria and urgency.  Neurological: Negative for focal weakness, seizures and loss of consciousness.  All other systems reviewed and are negative.  Blood pressure (!) 145/97, pulse 63, temperature 98.2 F (36.8 C), temperature source Oral, resp. rate 18, height _0  (1.727 m), weight 228 lb 12.8 oz (103.8 kg), SpO2 98 %. Physical Exam  Vitals reviewed. Constitutional: He is oriented to person, place, and time. He appears well-developed and well-nourished. No distress.  HENT:  Head: Normocephalic and atraumatic.  Mouth/Throat: No oropharyngeal exudate.  Eyes: Conjunctivae and EOM are normal. No scleral icterus.  Neck: Neck supple. No thyromegaly present.  Cardiovascular: Normal rate, regular rhythm, normal heart sounds and intact distal pulses.  Exam reveals no gallop and no friction rub.   No murmur heard. Respiratory: Effort normal and breath sounds normal. No respiratory distress. He has no wheezes. He has no rales.  GI: Soft. He  exhibits no distension. There is no tenderness.  Musculoskeletal: He exhibits no edema or deformity.  Lymphadenopathy:    He has no cervical adenopathy.  Neurological: He is alert and oriented to person, place, and time. No cranial nerve deficit.  Motor intact  Skin: Skin is warm and dry.   Cardiac catheterization Conclusion     There is mild to moderate left ventricular systolic dysfunction. The left ventricular ejection fraction is 35-45% by visual estimate.  LV end diastolic pressure is severely elevated. 22-26 mmHg  Ost LM lesion, 55 %stenosed.  Dist LAD lesion, 100 %stenosed. Apical LAD appears to be subtotally occluded  Ost 1st Diag to 1st Diag lesion, 100 %stenosed. Appears to be chronic  1st Mrg lesion, 70 %stenosed. Moderate caliber vessel  Mid RCA lesion, 75 %stenosed. Ulcerated irregular apple core appearing lesion  Dist RCA lesion, 55 %stenosed.  Ost RPDA lesion, 60 %stenosed. RPDA lesion, 60 %stenosed.  Post Atrio-1 lesion (just after PDA takeoff), 70 %stenosed. Post Atrio-2 lesion (just prior to RPL 3), 95 %stenosed.  Very difficult radial access case due to tortuosity of the innominate artery making it very difficult to  engage coronaries. Also very difficult to cross the aortic valve.   The patient has at least moderate to severe not severe multivessel disease involving moderate to severe ostial left main disease and moderate severe disease in the RCA is a large sleeping dominant vessel.  Based on the extent of disease, complete revascularization via Coronary Bypass Grafting would likely be the best option for this gentleman.  For now, we need to address his blood pressure and risk factors.  Plan: Admit to step down unit. We will initially use IV nitroglycerin overnight until he can be started on oral medications  Restart IV heparin 8 hours after sheath removal pending troponin evaluation. Added carvedilol to home medications and uptitrate atorvastatin  80 mg daily.   Recommend rounding team consult CT surgery - will need surgical input to help determine if CABG versus PCI would be his best option.   Parker Sellers, M.D., M.S. Interventional Cardiologist    I personally reviewed the cardiac cath images and concur with the findings noted above  Assessment/Plan: 56 yo man with no prior cardiac history but multiple CRF presents with a STEMI that resolved prior to catheterization. He was found to have left main and 3 vessel CAD. His RCA has a complicated lesion that was the likely culprit.   In my opinion the best option for treatment is CABG for complete revascularization.   I have discussed the general nature of the procedure, the need for general anesthesia, the use of cardiopulmonary bypass and the incisions to be used with Mr and Mrs Vea. We discussed the expected hospital stay, overall recovery and short and long term outcomes. I informed them of the indications, risks, benefits and alternatives. They understand the risks include, but are not limited to death, stroke, MI, DVT/PE, bleeding, possible need for transfusion, infections, cardiac arrhythmias, as well as other organ system dysfunction including respiratory, renal, or GI complications.   If he decides to proceed with CABG we can probably do it on Monday  Parker Sellers 05/12/2017, 3:32 PM

## 2017-05-12 NOTE — H&P (Addendum)
CARDIOLOGY INPATIENT HISTORY AND PHYSICAL EXAMINATION NOTE  Patient ID: Parker Sellers MRN: [id number], DOB/AGE: 57-Sep-1962   Admit date: 05/12/2017   Primary Physician: Junie Spencer, FNP Primary Cardiologist: new  Reason for admission: chest pain  HPI: This is a 56 y.o. white male with with no known CAD but h/o obesity, HTN, chewable tobacco use and HLD presented with acute onset substernal chest pain with SBP >210 on presentation and ST elevation in anterior leads from [facility] hospital to cardiac cath lab. Patient described chest pain as pressure, central chest in location, sudden onset around 10 pm, 10/10 in intensity with radiation to the left side of the jaw, started at rest. No prior episode. Patient was on bp meds and lipitor which he was not taking. ST elevation improved on presentation to cath lab with improvement in BP.  Upon arrival to [facility], the ST elevation was less than millimeter.   Problem List: Past Medical History:  Diagnosis Date  . Hyperlipidemia   . Hypertension   . Sleep apnea   . Tobacco use     Past Surgical History:  Procedure Laterality Date  . HERNIA REPAIR    . KNEE SURGERY Left    Torn meniscus     Allergies: No Known Allergies   Home Medications Current Facility-Administered Medications  Medication Dose Route Frequency Provider Last Rate Last Dose  . 0.9 %  sodium chloride infusion   Intravenous Continuous Rancour, Jeannett Senior, MD      . fentaNYL (SUBLIMAZE) injection    PRN Marykay Lex, MD   50 mcg at 05/12/17 0256  . heparin 100-0.45 UNIT/ML-% infusion           . heparin 5000 UNIT/ML injection           . heparin infusion 2 units/mL in 0.9 % sodium chloride    Continuous PRN Marykay Lex, MD   1,000 mL at 05/12/17 0256  . lidocaine (PF) (XYLOCAINE) 1 % injection    PRN Marykay Lex, MD   2 mL at 05/12/17 0255  . midazolam (VERSED) injection    PRN Marykay Lex, MD   2 mg at 05/12/17 0256  . [MAR Hold]  nitroGLYCERIN 50 mg in dextrose 5 % 250 mL (0.2 mg/mL) infusion  5-200 mcg/min Intravenous Once Rancour, Jeannett Senior, MD      . Radial Cocktail/Verapamil only    PRN Marykay Lex, MD   8 mL at 05/12/17 0258     Family History  Problem Relation Age of Onset  . Cancer Mother   . Hypertension Father   . Liver disease Father   . Alcohol abuse Father   . AAA (abdominal aortic aneurysm) Father   . Coronary artery disease Brother        recent stent placement     Social History   Social History  . Marital status: Single    Spouse name: N/A  . Number of children: N/A  . Years of education: N/A   Occupational History  . Not on file.   Social History Main Topics  . Smoking status: Former Games developer  . Smokeless tobacco: Current User    Types: Snuff, Chew  . Alcohol use 1.2 oz/week    2 Cans of beer per week  . Drug use: No  . Sexual activity: Yes   Other Topics Concern  . Not on file   Social History Narrative  . No narrative on file  Review of Systems: General: negative for chills, fever, night sweats or weight changes.  Cardiovascular: chest pain, dyspnea negative for dyspnea on exertion, edema, orthopnea, palpitations, paroxysmal nocturnal dyspnea  Dermatological: negative for rash Respiratory: negative for cough or wheezing Urologic: negative for hematuria Abdominal: negative for nausea, vomiting, diarrhea, bright red blood per rectum, melena, or hematemesis Neurologic: negative for visual changes, syncope, or dizziness Endocrine: no diabetes, no hypothyroidism Immunological: no lymph adenopathy Psych: non homicidal/suicidal  Physical Exam: Vitals: BP (!) 209/123   Ht  (1.727 m)   Wt 106.6 kg (235 lb)   SpO2 99%   BMI 35.73 kg/m  General: not in acute distress Upon arrival to the Cath Lab Neck: JVP flat, neck supple Heart: regular rate and rhythm, S1, S2, no murmurs  Lungs: CTAB, Nonlabored with good air movement. GI: non tender, non distended, bowel  sounds present Extremities: no edema Neuro: AAO x 3  Psych: anxious  Labs:   Results for orders placed or performed during the hospital encounter of 05/12/17 (from the past 24 hour(s))  CBC with Differential/Platelet     Status: Abnormal   Collection Time: 05/12/17  2:04 AM  Result Value Ref Range   WBC 10.9 (H) 4.0 - 10.5 K/uL   RBC 5.50 4.22 - 5.81 MIL/uL   Hemoglobin 17.2 (H) 13.0 - 17.0 g/dL   HCT 16.1 09.6 - 04.5 %   MCV 86.7 78.0 - 100.0 fL   MCH 31.3 26.0 - 34.0 pg   MCHC 36.1 (H) 30.0 - 36.0 g/dL   RDW 40.9 81.1 - 91.4 %   Platelets 213 150 - 400 K/uL   Neutrophils Relative % 69 %   Neutro Abs 7.5 1.7 - 7.7 K/uL   Lymphocytes Relative 23 %   Lymphs Abs 2.5 0.7 - 4.0 K/uL   Monocytes Relative 6 %   Monocytes Absolute 0.6 0.1 - 1.0 K/uL   Eosinophils Relative 2 %   Eosinophils Absolute 0.3 0.0 - 0.7 K/uL   Basophils Relative 0 %   Basophils Absolute 0.0 0.0 - 0.1 K/uL  Protime-INR     Status: None   Collection Time: 05/12/17  2:04 AM  Result Value Ref Range   Prothrombin Time 13.8 11.4 - 15.2 seconds   INR 1.07   APTT     Status: None   Collection Time: 05/12/17  2:04 AM  Result Value Ref Range   aPTT 26 24 - 36 seconds  Comprehensive metabolic panel     Status: Abnormal   Collection Time: 05/12/17  2:04 AM  Result Value Ref Range   Sodium 137 135 - 145 mmol/L   Potassium 3.3 (L) 3.5 - 5.1 mmol/L   Chloride 103 101 - 111 mmol/L   CO2 25 22 - 32 mmol/L   Glucose, Bld 127 (H) 65 - 99 mg/dL   BUN 18 6 - 20 mg/dL   Creatinine, Ser 7.82 0.61 - 1.24 mg/dL   Calcium 9.7 8.9 - 95.6 mg/dL   Total Protein 8.5 (H) 6.5 - 8.1 g/dL   Albumin 4.8 3.5 - 5.0 g/dL   AST 26 15 - 41 U/L   ALT 21 17 - 63 U/L   Alkaline Phosphatase 81 38 - 126 U/L   Total Bilirubin 1.1 0.3 - 1.2 mg/dL   GFR calc non Af Amer >60 >60 mL/min   GFR calc Af Amer >60 >60 mL/min   Anion gap 9 5 - 15  Troponin I     Status: Abnormal  Collection Time: 05/12/17  2:04 AM  Result Value Ref Range    Troponin I 0.19 (HH) <0.03 ng/mL  Lipid panel     Status: Abnormal   Collection Time: 05/12/17  2:04 AM  Result Value Ref Range   Cholesterol 213 (H) 0 - 200 mg/dL   Triglycerides 161 (H) <150 mg/dL   HDL 30 (L) >09 mg/dL   Total CHOL/HDL Ratio 7.1 RATIO   VLDL 46 (H) 0 - 40 mg/dL   LDL Cholesterol 604 (H) 0 - 99 mg/dL  I-stat troponin, ED     Status: Abnormal   Collection Time: 05/12/17  2:04 AM  Result Value Ref Range   Troponin i, poc 0.22 (HH) 0.00 - 0.08 ng/mL   Comment NOTIFIED PHYSICIAN    Comment 3          I-stat chem 8, ed     Status: Abnormal   Collection Time: 05/12/17  2:08 AM  Result Value Ref Range   Sodium 141 135 - 145 mmol/L   Potassium 3.4 (L) 3.5 - 5.1 mmol/L   Chloride 104 101 - 111 mmol/L   BUN 20 6 - 20 mg/dL   Creatinine, Ser 5.40 0.61 - 1.24 mg/dL   Glucose, Bld 981 (H) 65 - 99 mg/dL   Calcium, Ion 1.91 1.15 - 1.40 mmol/L   TCO2 25 22 - 32 mmol/L   Hemoglobin 17.3 (H) 13.0 - 17.0 g/dL   HCT 47.8 29.5 - 62.1 %     Radiology/Studies: No results found.  EKG: normal sinus rhythm, LVH and repolarization changes  Echo: pending  Cardiac cath: see report by Dr. Herbie Baltimore dated 05/12/17  Medical decision making:  Discussed care with the patient Discussed care with the physician on the phone Reviewed labs and imaging personally Reviewed prior records  ASSESSMENT AND PLAN:  This is a 56 y.o.white male with no known CAD but h/o obesity, HTN, snuff tobacco use and HLD presented with acute onset substernal chest pain with SBP >210 on presentation and ST elevation in anterior leads   Principal Problem:   ST elevation myocardial infarction (STEMI) of lateral wall, initial episode of care [facility]) Active Problems:   Hypertension   Obesity (BMI 30-39.9)   Hyperlipidemia   Hypertensive emergency   Tobacco use   Anterior ST elevation in the setting of hypertensive emergency - given ongoing chest pain and ST elevation, we will proceed to emergent cardiac  catheterization and PCI if needed. Initial angiogram demonstrated moderate to obstructive LM ostial lesion but no culprit lesion In the left coronary system as well as diffuse moderate severe disease in the RCA is extremely large and covers a good portion of the inferior and inferolateral wall.  I suspect patient had an essentially hypertensive emergency (having been off his blood pressure medications for several weeks). The extent of hypertension with his coronary disease but to symptoms of acute coronary syndrome.  - will perform echocardiogram to evaluate for LVH, wall motion in AM, screen for hyperlipidemia and diabetes by ordering lipid profile and HbA1c respectively, counseled for avoiding second hand smoke/tobacco dip and recommended medication compliance   We will start IV heparin pending troponin evaluation.  Accelerated hypertension, hypertensive emergency 2/2 non compliance with BP meds - decrease SBP to 160 in first 6 hours, check BNP - IV hydralazine prn  - restart home medications - which are amlodipine and lisinopril. I will also add carvedilol - 1 dose IV Lasix given elevated LVEDP (to reassess if further diuresis as  needed over the weekend) - IV nitroglycerin overnight until blood pressure medication started.  Hyperlipidemia, unknown type - lipid panel ordered, start Lipitor  Obesity Counseling provided  Chewable tobacco use - counseling against tobacco use provided  Signed, Joellyn RuedQURESHI, WAQAS T, MD MS 05/12/2017, 3:05 AM   I saw evaluated the patient along with Dr. Antoine PrimasQueshi.  We performed a physical exam and interview together. He was present with me during the cardiac catheterization. The above examination and plan are according to our discussion.   Bryan Lemmaavid Sequoya Hogsett, M.D., M.S. Interventional Cardiologist   Pager # [phone] Phone # [phone] [address] [place], [place] [address]

## 2017-05-12 NOTE — Progress Notes (Signed)
ANTICOAGULATION CONSULT NOTE  Pharmacy Consult for heparin Indication: CAD awaiting surgical consult vs PCI  No Known Allergies  Patient Measurements: Height: 5\' 8"  (172.7 cm) Weight: 228 lb 12.8 oz (103.8 kg) IBW/kg (Calculated) : 68.4 Heparin Dosing Weight: 90kg  Vital Signs: Temp: 98.6 F (37 C) (09/08 1941) Temp Source: Oral (09/08 1941) BP: 168/103 (09/08 1941) Pulse Rate: 80 (09/08 1941)  Labs:  Recent Labs  05/12/17 0204 05/12/17 0208 05/12/17 0747 05/12/17 1150 05/12/17 2034  HGB 17.2* 17.3* 17.3*  --   --   HCT 47.7 51.0 48.1  --   --   PLT 213  --  196  --   --   APTT 26  --   --   --   --   LABPROT 13.8  --   --   --   --   INR 1.07  --   --   --   --   HEPARINUNFRC  --   --   --   --  <0.10*  CREATININE 1.07 1.10 1.05  --   --   TROPONINI 0.19*  --   --  4.67*  --     Estimated Creatinine Clearance: 90.7 mL/min (by C-G formula based on SCr of 1.05 mg/dL).   Medical History: Past Medical History:  Diagnosis Date  . Hyperlipidemia   . Hypertension   . Sleep apnea   . Tobacco use     Medications:  Prescriptions Prior to Admission  Medication Sig Dispense Refill Last Dose  . amLODipine (NORVASC) 5 MG tablet Take 1 tablet (5 mg total) by mouth daily. 90 tablet 3 Past Month at Unknown time  . atorvastatin (LIPITOR) 40 MG tablet Take 40 mg by mouth every evening.   0 Past Month at Unknown time  . lisinopril-hydrochlorothiazide (ZESTORETIC) 20-12.5 MG tablet Take 2 tablets by mouth daily. 180 tablet 3 Past Month at Unknown time  . Multiple Vitamin (MULTIVITAMIN WITH MINERALS) TABS tablet Take 1 tablet by mouth daily.   Past Month at Unknown time  . Vitamin D, Ergocalciferol, (DRISDOL) 50000 units CAPS capsule Take 1 capsule (50,000 Units total) by mouth every 7 (seven) days. 12 capsule 3 Past Month at Unknown time   Scheduled:  . amLODipine  5 mg Oral Daily  . aspirin  81 mg Oral Daily  . atorvastatin  80 mg Oral q1800  . carvedilol  6.25 mg Oral  BID WC  . lisinopril  40 mg Oral Daily   And  . hydrochlorothiazide  25 mg Oral Daily  . sodium chloride flush  3 mL Intravenous Q12H   Infusions:  . sodium chloride    . heparin 1,250 Units/hr (05/12/17 1207)  . nitroGLYCERIN Stopped (05/12/17 0447)    Assessment: 56yo male [facility] for emergent cardiac cath for STEMI, moderate to severe CAD revealed in lab, now awaiting cardiothoracic consult for CABG vs PCI, continuing on heparin.  Heparin level undetectable tonight. CBC wnl. Per RN - no issues with IV line or bleeding.  Goal of Therapy:  Heparin level 0.3-0.7 units/ml Monitor platelets by anticoagulation protocol: Yes   Plan:  Increase heparin to 1550 units/hr 6hr heparin level Daily heparin level/CBC Monitor for s/sx bleeding Possible CABG Monday  Babs BertinHaley Carleigh Buccieri, PharmD, BCPS Clinical Pharmacist 05/12/2017 9:24 PM

## 2017-05-12 NOTE — Progress Notes (Signed)
ANTICOAGULATION CONSULT NOTE - Initial Consult  Pharmacy Consult for heparin Indication: CAD awaiting surgical consult vs PCI  No Known Allergies  Patient Measurements: Height: 5\' 8"  (172.7 cm) Weight: 228 lb 12.8 oz (103.8 kg) IBW/kg (Calculated) : 68.4 Heparin Dosing Weight: 90kg  Vital Signs: Temp: 98.1 F (36.7 C) (09/08 0420) Temp Source: Oral (09/08 0420) BP: 152/84 (09/08 0517) Pulse Rate: 148 (09/08 0359)  Labs:  Recent Labs  05/12/17 0204 05/12/17 0208  HGB 17.2* 17.3*  HCT 47.7 51.0  PLT 213  --   APTT 26  --   LABPROT 13.8  --   INR 1.07  --   CREATININE 1.07 1.10  TROPONINI 0.19*  --     Estimated Creatinine Clearance: 86.6 mL/min (by C-G formula based on SCr of 1.1 mg/dL).   Medical History: Past Medical History:  Diagnosis Date  . Hyperlipidemia   . Hypertension   . Sleep apnea   . Tobacco use     Medications:  Prescriptions Prior to Admission  Medication Sig Dispense Refill Last Dose  . amLODipine (NORVASC) 5 MG tablet Take 1 tablet (5 mg total) by mouth daily. 90 tablet 3   . atorvastatin (LIPITOR) 40 MG tablet   0 Taking  . lisinopril-hydrochlorothiazide (ZESTORETIC) 20-12.5 MG tablet Take 2 tablets by mouth daily. 180 tablet 3 Taking  . Vitamin D, Ergocalciferol, (DRISDOL) 50000 units CAPS capsule Take 1 capsule (50,000 Units total) by mouth every 7 (seven) days. 12 capsule 3    Scheduled:  . amLODipine  5 mg Oral Daily  . aspirin  81 mg Oral Daily  . atorvastatin  80 mg Oral q1800  . carvedilol  3.125 mg Oral BID WC  . heparin      . lisinopril  40 mg Oral Daily   And  . hydrochlorothiazide  25 mg Oral Daily  . sodium chloride flush  3 mL Intravenous Q12H   Infusions:  . sodium chloride 100 mL/hr at 05/12/17 0446  . sodium chloride    . heparin    . nitroGLYCERIN Stopped (05/12/17 0447)    Assessment: 56yo [facility] emergent cardiac cath for STEMI, moderate to severe CAD revealed in lab, now awaiting  cardiothoracic consult for CABG vs PCI, to begin heparin 8hr after sheath removal (pulled [phone]).  Goal of Therapy:  Heparin level 0.3-0.7 units/ml Monitor platelets by anticoagulation protocol: Yes   Plan:  At 1145 will begin heparin gtt at 1250 units/hr and monitor heparin levels and CBC.  Vernard GamblesVeronda Anshika Pethtel, PharmD, BCPS  05/12/2017,5:20 AM

## 2017-05-12 NOTE — ED Provider Notes (Signed)
[facility]-EMERGENCY DEPT Provider Note   CSN: [id number] Arrival date & time: 05/12/17  0137     History   Chief Complaint Chief Complaint  Patient presents with  . Chest Pain    HPI Parker Sellers is a 56 y.o. male.  Patient states he developed anterior central chest pain while resting playing cards around 10 PM. It radiates to his bilateral jaws and shoulders. He reports the pain has been waxing and waning in intensity since then. Did have some diaphoresis initially but that resolved. No nausea or shortness of breath. He is hypertensive and has not had his medications for about 2 weeks. Denies any cardiac history. He's never had a heart attack. Denies any leg pain or leg swelling. Denies any radiation of pain to his back. He is a snuff user and does not smoke. States the pain is 3 out of 10 currently. EKG shows lateral STEMI.   The history is provided by the patient and a relative. The history is limited by the condition of the patient.  Chest Pain   Associated symptoms include nausea and shortness of breath. Pertinent negatives include no dizziness, no fever, no headaches, no vomiting and no weakness.    Past Medical History:  Diagnosis Date  . Hypertension   . Sleep apnea     Patient Active Problem List   Diagnosis Date Noted  . Hypertension 03/23/2017  . Obesity (BMI 30-39.9) 03/23/2017    Past Surgical History:  Procedure Laterality Date  . HERNIA REPAIR    . KNEE SURGERY Left    Torn meniscus       Home Medications    Prior to Admission medications   Medication Sig Start Date End Date Taking? Authorizing Provider  amLODipine (NORVASC) 5 MG tablet Take 1 tablet (5 mg total) by mouth daily. 04/02/17   Junie SpencerHawks, Christy A, FNP  atorvastatin (LIPITOR) 40 MG tablet  03/20/17   [provider]  lisinopril-hydrochlorothiazide (ZESTORETIC) 20-12.5 MG tablet Take 2 tablets by mouth daily. 03/26/17   Junie SpencerHawks, Christy A, FNP  Vitamin D, Ergocalciferol, (DRISDOL) 50000  units CAPS capsule Take 1 capsule (50,000 Units total) by mouth every 7 (seven) days. 04/03/17   Junie SpencerHawks, Christy A, FNP    Family History Family History  Problem Relation Age of Onset  . Cancer Mother   . Hypertension Father   . Liver disease Father   . Alcohol abuse Father   . AAA (abdominal aortic aneurysm) Father     Social History Social History  Substance Use Topics  . Smoking status: Former Games developermoker  . Smokeless tobacco: Current User    Types: Snuff  . Alcohol use 1.2 oz/week    2 Cans of beer per week     Allergies   Patient has no known allergies.   Review of Systems Review of Systems  Constitutional: Negative for activity change, appetite change and fever.  HENT: Negative for congestion and rhinorrhea.   Eyes: Negative for visual disturbance.  Respiratory: Positive for chest tightness and shortness of breath.   Cardiovascular: Positive for chest pain.  Gastrointestinal: Positive for nausea. Negative for vomiting.  Genitourinary: Negative for urgency.  Musculoskeletal: Negative for arthralgias and myalgias.  Skin: Negative for rash.  Neurological: Negative for dizziness, weakness, light-headedness and headaches.   all other systems are negative except as noted in the HPI and PMH.     Physical Exam Updated Vital Signs BP (!) 209/123   Ht 5\' 8"  (1.727 m)   Wt 106.6  kg (235 lb)   BMI 35.73 kg/m   Physical Exam  Constitutional: He is oriented to person, place, and time. He appears well-developed and well-nourished. No distress.  HENT:  Head: Normocephalic and atraumatic.  Mouth/Throat: Oropharynx is clear and moist. No oropharyngeal exudate.  Eyes: Pupils are equal, round, and reactive to light. Conjunctivae and EOM are normal. No scleral icterus.  Neck: Normal range of motion. Neck supple.  No meningismus.  Cardiovascular: Normal rate, regular rhythm, normal heart sounds and intact distal pulses.  Exam reveals no gallop.   No murmur heard. Equal radial  pulses and grip strengths  Pulmonary/Chest: Effort normal and breath sounds normal. No respiratory distress. He exhibits no tenderness.  Abdominal: Soft. There is no tenderness. There is no rebound and no guarding.  Musculoskeletal: Normal range of motion. He exhibits no edema or tenderness.  Neurological: He is alert and oriented to person, place, and time. No cranial nerve deficit. He exhibits normal muscle tone. Coordination normal.  No ataxia on finger to nose bilaterally. No pronator drift. 5/5 strength throughout. CN 2-12 intact.Equal grip strength. Sensation intact.   Skin: Skin is warm.  Psychiatric: He has a normal mood and affect. His behavior is normal.  Nursing note and vitals reviewed.    ED Treatments / Results  Labs (all labs ordered are listed, but only abnormal results are displayed) Labs Reviewed  CBC WITH DIFFERENTIAL/PLATELET - Abnormal; Notable for the following:       Result Value   WBC 10.9 (*)    Hemoglobin 17.2 (*)    MCHC 36.1 (*)    All other components within normal limits  COMPREHENSIVE METABOLIC PANEL - Abnormal; Notable for the following:    Potassium 3.3 (*)    Glucose, Bld 127 (*)    Total Protein 8.5 (*)    All other components within normal limits  TROPONIN I - Abnormal; Notable for the following:    Troponin I 0.19 (*)    All other components within normal limits  LIPID PANEL - Abnormal; Notable for the following:    Cholesterol 213 (*)    Triglycerides 231 (*)    HDL 30 (*)    VLDL 46 (*)    LDL Cholesterol 137 (*)    All other components within normal limits  CBC - Abnormal; Notable for the following:    WBC 10.6 (*)    Hemoglobin 17.3 (*)    All other components within normal limits  BASIC METABOLIC PANEL - Abnormal; Notable for the following:    Potassium 3.3 (*)    Glucose, Bld 118 (*)    All other components within normal limits  I-STAT CHEM 8, ED - Abnormal; Notable for the following:    Potassium 3.4 (*)    Glucose, Bld 128  (*)    Hemoglobin 17.3 (*)    All other components within normal limits  I-STAT TROPONIN, ED - Abnormal; Notable for the following:    Troponin i, poc 0.22 (*)    All other components within normal limits  PROTIME-INR  APTT  HEPARIN LEVEL (UNFRACTIONATED)    EKG  EKG Interpretation  Date/Time:  Saturday May 12 2017 01:55:33 EDT Ventricular Rate:  104 PR Interval:    QRS Duration: 97 QT Interval:  332 QTC Calculation: 437 R Axis:   18 Text Interpretation:  Sinus tachycardia Probable left atrial enlargement Repol abnrm, global ischemia, diffuse leads ST elevation, consider lateral injury Lateral STEMI  Confirmed by Glynn Octave [phone]) on 05/12/2017  2:12:30 AM       Radiology No results found.  Procedures Procedures (including critical care time)  Medications Ordered in ED Medications  heparin 100-0.45 UNIT/ML-% infusion (not administered)  0.9 %  sodium chloride infusion (not administered)  heparin lock flush 100 UNIT/ML injection (not administered)  heparin 5000 UNIT/ML injection (not administered)  nitroGLYCERIN 50 mg in dextrose 5 % 250 mL (0.2 mg/mL) infusion (not administered)  aspirin chewable tablet 324 mg (324 mg Oral Given 05/12/17 0209)  heparin injection 4,000 Units (4,000 Units Intravenous Given 05/12/17 0211)  metoprolol tartrate (LOPRESSOR) injection 5 mg (5 mg Intravenous Given 05/12/17 0211)  nitroGLYCERIN (NITROSTAT) SL tablet 0.4 mg (0.4 mg Sublingual Given 05/12/17 0212)     Initial Impression / Assessment and Plan / ED Course  I have reviewed the triage vital signs and the nursing notes.  Pertinent labs & imaging results that were available during my care of the patient were reviewed by me and considered in my medical decision making (see chart for details).    Central chest pain that radiates to his jaws associated with shortness of breath. He is very hypertensive. EKG shows lateral STEMI. Has not had BP meds in several weeks.  Patient given  aspirin and heparin. Code STEMI activated. Discussed with Dr. Herbie Baltimore at [facility]. Less concern for aortic dissection. No back pain. Comfortable appearing.  We'll also give nitroglycerin and metoprolol given patient's hypertension. He has equal upper extremity radial pulses and grip strength.  Patient taken emergency traffic to [facility] for catheterization. Wife updated.  CRITICAL CARE Performed by: Glynn Octave Total critical care time: 32 minutes Critical care time was exclusive of separately billable procedures and treating other patients. Critical care was necessary to treat or prevent imminent or life-threatening deterioration. Critical care was time spent personally by me on the following activities: development of treatment plan with patient and/or surrogate as well as nursing, discussions with consultants, evaluation of patient's response to treatment, examination of patient, obtaining history from patient or surrogate, ordering and performing treatments and interventions, ordering and review of laboratory studies, ordering and review of radiographic studies, pulse oximetry and re-evaluation of patient's condition.   Final Clinical Impressions(s) / ED Diagnoses   Final diagnoses:  ST elevation myocardial infarction (STEMI), unspecified artery [facility])    New Prescriptions New Prescriptions   No medications on file     Glynn Octave, MD 05/12/17 [phone]

## 2017-05-12 NOTE — Progress Notes (Signed)
Patient tolerating deflating TR band. No bleeding or bruising noted. Radial pulse palpable.

## 2017-05-12 NOTE — Progress Notes (Signed)
Patient admitted from Cath lab. post op emergency cath via right radial. Patient alert and oriented. Bp still elevated but trending down. Nitro drip. Oriented to room and surroundings. Family at the bedside.

## 2017-05-12 NOTE — ED Triage Notes (Signed)
Pt reports central chest pain that started approx 10 pm, states currently is 8/10 on arrival.  Pt admits to radiation of pain across chest and into both jaws.  Pt states he stopped his blood pressure meds about 2 weeks ago.

## 2017-05-13 ENCOUNTER — Inpatient Hospital Stay (HOSPITAL_COMMUNITY): Payer: BLUE CROSS/BLUE SHIELD

## 2017-05-13 DIAGNOSIS — I255 Ischemic cardiomyopathy: Secondary | ICD-10-CM

## 2017-05-13 HISTORY — PX: TRANSTHORACIC ECHOCARDIOGRAM: SHX275

## 2017-05-13 LAB — CBC
HCT: 47.2 % (ref 39.0–52.0)
Hemoglobin: 16.2 g/dL (ref 13.0–17.0)
MCH: 29.9 pg (ref 26.0–34.0)
MCHC: 34.3 g/dL (ref 30.0–36.0)
MCV: 87.1 fL (ref 78.0–100.0)
Platelets: 205 10*3/uL (ref 150–400)
RBC: 5.42 MIL/uL (ref 4.22–5.81)
RDW: 14.3 % (ref 11.5–15.5)
WBC: 11.4 10*3/uL — ABNORMAL HIGH (ref 4.0–10.5)

## 2017-05-13 LAB — URINALYSIS, ROUTINE W REFLEX MICROSCOPIC
Bilirubin Urine: NEGATIVE
Glucose, UA: NEGATIVE mg/dL
Hgb urine dipstick: NEGATIVE
Ketones, ur: NEGATIVE mg/dL
Leukocytes, UA: NEGATIVE
Nitrite: NEGATIVE
Protein, ur: NEGATIVE mg/dL
Specific Gravity, Urine: 1.006 (ref 1.005–1.030)
pH: 6 (ref 5.0–8.0)

## 2017-05-13 LAB — TYPE AND SCREEN
ABO/RH(D): AB POS
Antibody Screen: NEGATIVE

## 2017-05-13 LAB — BLOOD GAS, ARTERIAL
Acid-base deficit: 0.5 mmol/L (ref 0.0–2.0)
Bicarbonate: 23 mmol/L (ref 20.0–28.0)
Drawn by: 41308
FIO2: 21
O2 Saturation: 97 %
Patient temperature: 98.6
pCO2 arterial: 33.8 mmHg (ref 32.0–48.0)
pH, Arterial: 7.448 (ref 7.350–7.450)
pO2, Arterial: 85 mmHg (ref 83.0–108.0)

## 2017-05-13 LAB — ECHOCARDIOGRAM COMPLETE
Height: 68 in
Weight: 3660.8 oz

## 2017-05-13 LAB — HEPARIN LEVEL (UNFRACTIONATED)
Heparin Unfractionated: 0.19 IU/mL — ABNORMAL LOW (ref 0.30–0.70)
Heparin Unfractionated: 0.31 IU/mL (ref 0.30–0.70)
Heparin Unfractionated: 0.31 IU/mL (ref 0.30–0.70)

## 2017-05-13 MED ORDER — POTASSIUM CHLORIDE CRYS ER 20 MEQ PO TBCR
40.0000 meq | EXTENDED_RELEASE_TABLET | Freq: Two times a day (BID) | ORAL | Status: DC
  Administered 2017-05-13 (×2): 40 meq via ORAL
  Filled 2017-05-13 (×2): qty 2

## 2017-05-13 MED ORDER — METOPROLOL TARTRATE 12.5 MG HALF TABLET
12.5000 mg | ORAL_TABLET | Freq: Once | ORAL | Status: AC
  Administered 2017-05-14: 12.5 mg via ORAL
  Filled 2017-05-13: qty 1

## 2017-05-13 MED ORDER — CARVEDILOL 12.5 MG PO TABS
12.5000 mg | ORAL_TABLET | Freq: Two times a day (BID) | ORAL | Status: DC
  Administered 2017-05-13 – 2017-05-14 (×3): 12.5 mg via ORAL
  Filled 2017-05-13 (×3): qty 1

## 2017-05-13 MED ORDER — EPINEPHRINE PF 1 MG/ML IJ SOLN
0.0000 ug/min | INTRAVENOUS | Status: DC
  Filled 2017-05-13: qty 4

## 2017-05-13 MED ORDER — CHLORHEXIDINE GLUCONATE CLOTH 2 % EX PADS: 6.0000 | MEDICATED_PAD | Freq: Once | CUTANEOUS | Status: DC

## 2017-05-13 MED ORDER — TEMAZEPAM 7.5 MG PO CAPS: 15.0000 mg | ORAL_CAPSULE | Freq: Once | ORAL | Status: DC | PRN

## 2017-05-13 MED ORDER — TRANEXAMIC ACID 1000 MG/10ML IV SOLN
1.5000 mg/kg/h | INTRAVENOUS | Status: AC
  Administered 2017-05-14: 1.5 mg/kg/h via INTRAVENOUS
  Filled 2017-05-13: qty 25

## 2017-05-13 MED ORDER — NITROGLYCERIN IN D5W 200-5 MCG/ML-% IV SOLN
2.0000 ug/min | INTRAVENOUS | Status: DC
  Filled 2017-05-13: qty 250

## 2017-05-13 MED ORDER — BISACODYL 5 MG PO TBEC
5.0000 mg | DELAYED_RELEASE_TABLET | Freq: Once | ORAL | Status: AC
  Administered 2017-05-13: 5 mg via ORAL
  Filled 2017-05-13: qty 1

## 2017-05-13 MED ORDER — MAGNESIUM SULFATE 50 % IJ SOLN
40.0000 meq | INTRAMUSCULAR | Status: DC
  Filled 2017-05-13: qty 10

## 2017-05-13 MED ORDER — SODIUM CHLORIDE 0.9 % IV SOLN
1500.0000 mg | INTRAVENOUS | Status: AC
  Administered 2017-05-14: 1500 mg via INTRAVENOUS
  Filled 2017-05-13: qty 1500

## 2017-05-13 MED ORDER — SODIUM CHLORIDE 0.9 % IV SOLN
INTRAVENOUS | Status: AC
  Administered 2017-05-14: 1.1 [IU]/h via INTRAVENOUS
  Filled 2017-05-13: qty 1

## 2017-05-13 MED ORDER — DEXTROSE 5 % IV SOLN
1.5000 g | INTRAVENOUS | Status: AC
  Administered 2017-05-14: 1.5 g via INTRAVENOUS
  Administered 2017-05-14: .75 g via INTRAVENOUS
  Filled 2017-05-13: qty 1.5

## 2017-05-13 MED ORDER — ALPRAZOLAM 0.25 MG PO TABS: 0.2500 mg | ORAL_TABLET | ORAL | Status: DC | PRN

## 2017-05-13 MED ORDER — DEXTROSE 5 % IV SOLN
750.0000 mg | INTRAVENOUS | Status: DC
  Filled 2017-05-13: qty 750

## 2017-05-13 MED ORDER — POTASSIUM CHLORIDE 2 MEQ/ML IV SOLN
80.0000 meq | INTRAVENOUS | Status: DC
  Filled 2017-05-13: qty 40

## 2017-05-13 MED ORDER — TRANEXAMIC ACID (OHS) PUMP PRIME SOLUTION
2.0000 mg/kg | INTRAVENOUS | Status: DC
  Filled 2017-05-13: qty 2.08

## 2017-05-13 MED ORDER — DOPAMINE-DEXTROSE 3.2-5 MG/ML-% IV SOLN
0.0000 ug/kg/min | INTRAVENOUS | Status: AC
  Administered 2017-05-14: 3 ug/kg/min via INTRAVENOUS
  Filled 2017-05-13: qty 250

## 2017-05-13 MED ORDER — DIAZEPAM 5 MG PO TABS
5.0000 mg | ORAL_TABLET | Freq: Once | ORAL | Status: AC
  Administered 2017-05-14: 5 mg via ORAL
  Filled 2017-05-13: qty 1

## 2017-05-13 MED ORDER — SODIUM CHLORIDE 0.9 % IV SOLN
30.0000 ug/min | INTRAVENOUS | Status: AC
  Administered 2017-05-14: 35 ug/min via INTRAVENOUS
  Filled 2017-05-13: qty 2

## 2017-05-13 MED ORDER — SODIUM CHLORIDE 0.9 % IV SOLN
INTRAVENOUS | Status: DC
  Filled 2017-05-13: qty 30

## 2017-05-13 MED ORDER — CHLORHEXIDINE GLUCONATE 0.12 % MT SOLN
15.0000 mL | Freq: Once | OROMUCOSAL | Status: AC
  Administered 2017-05-14: 15 mL via OROMUCOSAL
  Filled 2017-05-13: qty 15

## 2017-05-13 MED ORDER — DEXMEDETOMIDINE HCL IN NACL 400 MCG/100ML IV SOLN
0.1000 ug/kg/h | INTRAVENOUS | Status: AC
  Administered 2017-05-14: .5 ug/kg/h via INTRAVENOUS
  Filled 2017-05-13: qty 100

## 2017-05-13 MED ORDER — TRANEXAMIC ACID (OHS) BOLUS VIA INFUSION
15.0000 mg/kg | INTRAVENOUS | Status: AC
  Administered 2017-05-14: 1557 mg via INTRAVENOUS
  Filled 2017-05-13 (×2): qty 1557

## 2017-05-13 MED ORDER — PLASMA-LYTE 148 IV SOLN
INTRAVENOUS | Status: AC
  Administered 2017-05-14: 08:00:00
  Filled 2017-05-13: qty 2.5

## 2017-05-13 NOTE — Progress Notes (Signed)
ANTICOAGULATION CONSULT NOTE  Pharmacy Consult for heparin Indication: CAD awaiting surgical consult vs PCI  No Known Allergies  Patient Measurements: Height: 5\' 8"  (172.7 cm) Weight: 228 lb 12.8 oz (103.8 kg) IBW/kg (Calculated) : 68.4 Heparin Dosing Weight: 90kg  Vital Signs: Temp: 98.5 F (36.9 C) (09/09 0430) Temp Source: Oral (09/09 0430) BP: 141/88 (09/09 0430) Pulse Rate: 86 (09/09 0430)  Labs:  Recent Labs  05/12/17 0204 05/12/17 0208 05/12/17 0747 05/12/17 1150 05/12/17 2034 05/13/17 0453  HGB 17.2* 17.3* 17.3*  --   --  16.2  HCT 47.7 51.0 48.1  --   --  47.2  PLT 213  --  196  --   --  205  APTT 26  --   --   --   --   --   LABPROT 13.8  --   --   --   --   --   INR 1.07  --   --   --   --   --   HEPARINUNFRC  --   --   --   --  <0.10* 0.19*  CREATININE 1.07 1.10 1.05  --   --   --   TROPONINI 0.19*  --   --  4.67*  --   --     Estimated Creatinine Clearance: 90.7 mL/min (by C-G formula based on SCr of 1.05 mg/dL).   Medical History: Past Medical History:  Diagnosis Date  . Hyperlipidemia   . Hypertension   . Sleep apnea   . Tobacco use     Medications:  Prescriptions Prior to Admission  Medication Sig Dispense Refill Last Dose  . amLODipine (NORVASC) 5 MG tablet Take 1 tablet (5 mg total) by mouth daily. 90 tablet 3 Past Month at Unknown time  . atorvastatin (LIPITOR) 40 MG tablet Take 40 mg by mouth every evening.   0 Past Month at Unknown time  . lisinopril-hydrochlorothiazide (ZESTORETIC) 20-12.5 MG tablet Take 2 tablets by mouth daily. 180 tablet 3 Past Month at Unknown time  . Multiple Vitamin (MULTIVITAMIN WITH MINERALS) TABS tablet Take 1 tablet by mouth daily.   Past Month at Unknown time  . Vitamin D, Ergocalciferol, (DRISDOL) 50000 units CAPS capsule Take 1 capsule (50,000 Units total) by mouth every 7 (seven) days. 12 capsule 3 Past Month at Unknown time   Scheduled:  . amLODipine  5 mg Oral Daily  . aspirin  81 mg Oral Daily  .  atorvastatin  80 mg Oral q1800  . carvedilol  6.25 mg Oral BID WC  . lisinopril  40 mg Oral Daily   And  . hydrochlorothiazide  25 mg Oral Daily  . sodium chloride flush  3 mL Intravenous Q12H   Infusions:  . sodium chloride    . heparin 1,550 Units/hr (05/12/17 2130)  . nitroGLYCERIN Stopped (05/12/17 0447)    Assessment: 56[facility] for STEMI, moderate to severe CAD revealed in lab, now awaiting cardiothoracic consult for CABG vs PCI, continuing on heparin.  Heparin level subtherapeutic at 0.19 with no infusion issues per RN. CBC stable and no s/s bleeding noted.   Goal of Therapy:  Heparin level 0.3-0.7 units/ml Monitor platelets by anticoagulation protocol: Yes   Plan:  Increase heparin gtt to 1800 units/hr Heparin level in 6 hrs Daily heparin level and CBC Monitor for s/s bleeding F/u cardiology plan   York CeriseKatherine Cook, PharmD Clinical Pharmacist 05/13/17 5:56 AM

## 2017-05-13 NOTE — Progress Notes (Signed)
  Echocardiogram 2D Echocardiogram has been performed.  Janalyn HarderWest, Anaston Koehn R 05/13/2017, 1:44 PM

## 2017-05-13 NOTE — Progress Notes (Signed)
1 Day Post-Op Procedure(s) (LRB): LEFT HEART CATH AND CORONARY ANGIOGRAPHY (N/A) Subjective: No CP or SOB  Objective: Vital signs in last 24 hours: Temp:  [97.8 F (36.6 C)-98.6 F (37 C)] 98.1 F (36.7 C) (09/09 0820) Pulse Rate:  [72-86] 79 (09/09 0820) Cardiac Rhythm: Normal sinus rhythm (09/09 0900) Resp:  [16-25] 23 (09/09 0820) BP: (131-168)/(72-103) 149/87 (09/09 0820) SpO2:  [96 %-98 %] 96 % (09/09 0430)  Hemodynamic parameters for last 24 hours:    Intake/Output from previous day: 09/08 0701 - 09/09 0700 In: 600 [P.O.:600] Out: 1501 [Urine:1500; Stool:1] Intake/Output this shift: Total I/O In: -  Out: 500 [Urine:500]  General appearance: alert, cooperative and no distress Neurologic: intact Heart: regular rate and rhythm Lungs: clear to auscultation bilaterally  Lab Results:  Recent Labs  05/12/17 0747 05/13/17 0453  WBC 10.6* 11.4*  HGB 17.3* 16.2  HCT 48.1 47.2  PLT 196 205   BMET:  Recent Labs  05/12/17 0204 05/12/17 0208 05/12/17 0747  NA 137 141 137  K 3.3* 3.4* 3.3*  CL 103 104 104  CO2 25  --  25  GLUCOSE 127* 128* 118*  BUN 18 20 14   CREATININE 1.07 1.10 1.05  CALCIUM 9.7  --  9.5    PT/INR:  Recent Labs  05/12/17 0204  LABPROT 13.8  INR 1.07   ABG    Component Value Date/Time   TCO2 25 05/12/2017 0208   CBG (last 3)  No results for input(s): GLUCAP in the last 72 hours.  Assessment/Plan: S/P Procedure(s) (LRB): LEFT HEART CATH AND CORONARY ANGIOGRAPHY (N/A)  Left main and RCA coronary disease s/p aborted STEMI He has decided to proceed with CABG.  He is aware of the indications, risks, benefits and alternatives He does not have any questions at this time Will schedule for tomorrow AM   LOS: 1 day    Loreli SlotSteven C Ezri Fanguy 05/13/2017

## 2017-05-13 NOTE — Progress Notes (Addendum)
Progress Note  Patient Name: Parker Sellers Date of Encounter: 05/13/2017  Primary Cardiologist: Herbie Baltimore  Subjective   No CP, no SOB  Inpatient Medications    Scheduled Meds: . amLODipine  5 mg Oral Daily  . aspirin  81 mg Oral Daily  . atorvastatin  80 mg Oral q1800  . carvedilol  6.25 mg Oral BID WC  . lisinopril  40 mg Oral Daily   And  . hydrochlorothiazide  25 mg Oral Daily  . sodium chloride flush  3 mL Intravenous Q12H   Continuous Infusions: . sodium chloride    . heparin 1,800 Units/hr (05/13/17 0603)  . nitroGLYCERIN Stopped (05/12/17 0447)   PRN Meds: sodium chloride, acetaminophen, morphine injection, ondansetron (ZOFRAN) IV, oxyCODONE, sodium chloride flush   Vital Signs    Vitals:   05/12/17 1700 05/12/17 1941 05/13/17 0003 05/13/17 0430  BP:  (!) 168/103 131/72 (!) 141/88  Pulse: 78 80 72 86  Resp: (!) 22 (!) 22 18 (!) 25  Temp:  98.6 F (37 C) 97.8 F (36.6 C) 98.5 F (36.9 C)  TempSrc:  Oral Oral Oral  SpO2: 98% 97% 96% 96%  Weight:      Height:        Intake/Output Summary (Last 24 hours) at 05/13/17 0857 Last data filed at 05/13/17 0818  Gross per 24 hour  Intake              360 ml  Output             2001 ml  Net            -1641 ml   Filed Weights   05/12/17 0201 05/12/17 0420  Weight: 235 lb (106.6 kg) 228 lb 12.8 oz (103.8 kg)    Telemetry    Currently sinus rhythm, stable- Personally Reviewed  ECG    Sinus rhythm with lateral ST segment depression - Personally Reviewed  Physical Exam   GEN: Well nourished, well developed, in no acute distress obese HEENT: normal  Neck: no JVD, carotid bruits, or masses Cardiac: RRR; no murmurs, rubs, or gallops,no edema  Respiratory:  clear to auscultation bilaterally, normal work of breathing GI: soft, nontender, nondistended, + BS MS: no deformity or atrophy  Skin: warm and dry, no rash, cath site radial normal Neuro:  Alert and Oriented x 3, Strength and sensation are  intact Psych: euthymic mood, full affect   Labs    Chemistry  Recent Labs Lab 05/12/17 0204 05/12/17 0208 05/12/17 0747  NA 137 141 137  K 3.3* 3.4* 3.3*  CL 103 104 104  CO2 25  --  25  GLUCOSE 127* 128* 118*  BUN CREATININE 1.07 1.10 1.05  CALCIUM 9.7  --  9.5  PROT 8.5*  --   --   ALBUMIN 4.8  --   --   AST 26  --   --   ALT 21  --   --   ALKPHOS 81  --   --   BILITOT 1.1  --   --   GFRNONAA >60  --  >60  GFRAA >60  --  >60  ANIONGAP 9  --  8     Hematology  Recent Labs Lab 05/12/17 0204 05/12/17 0208 05/12/17 0747 05/13/17 0453  WBC 10.9*  --  10.6* 11.4*  RBC 5.50  --  5.61 5.42  HGB 17.2* 17.3* 17.3* 16.2  HCT 47.7 51.0 48.1 47.2  MCV 86.7  --  85.7 87.1  MCH 31.3  --  30.8 29.9  MCHC 36.1*  --  36.0 34.3  RDW 13.5  --  13.6 14.3  PLT 213  --  196 205    Cardiac Enzymes  Recent Labs Lab 05/12/17 0204 05/12/17 1150  TROPONINI 0.19* 4.67*     Recent Labs Lab 05/12/17 0204  TROPIPOC 0.22*     BNPNo results for input(s): BNP, PROBNP in the last 168 hours.   DDimer No results for input(s): DDIMER in the last 168 hours.   Radiology    No results found.  Cardiac Studies   Cardiac catheterization report reviewed  Patient Profile     56 y.o. male with multivessel coronary artery disease, 55% left main, lateral STEMI post-angiography by Dr. Herbie BaltimoreHarding, EF 40% awaiting CABG  Assessment & Plan    Lateral ST elevation myocardial infarction  - IV heparin, aspirin, beta blocker, ACE inhibitor, statin  - Dr. Dorris FetchHendrickson note reviewed, CABG recommended  - He is now off of nitroglycerin IV  - Increased carvedilol to 12.5 mg twice a day  - Trop 4  - Echocardiogram pending  Hypertensive emergency  - Blood pressure under much better control currently. Off of nitroglycerin drip. Stable  - Likely in part anxiety driven in the setting of his chest pain/cardiac catheterization  Coronary artery disease  - Please see his  catheterization report for full details  - 55% LM dz and significant RCA dz in large vessel  Morbid obesity  - BMI greater than 35 with hypertension, hyperlipidemia, CAD  - Continue to encourage weight loss. Discussed  Hyperlipidemia  -High intensity statin therapy with atorvastatin 80 mg. Guideline  Hypokalemia  - We will replete again today. Stop HCTZ. Increase coreg again.  As of interest, he was married one month ago, wife in room.  Awaiting CABG decision by patient.    For questions or updates, please contact [facility] Please consult [email or web address] for contact info under Cardiology/STEMI. Daytime calls, contact the Day Call APP (6a-8a) or assigned team (Teams A-D) provider (7:30a - 5p). All other daytime calls (7:30-5p), contact the Card Master @ [phone].   Nighttime calls, contact the assigned APP (5p-8p) or MD (6:30p-8p). Overnight calls (8p-6a), contact the on call Fellow @ [phone].      Signed, Donato SchultzMark Anea Fodera, MD  05/13/2017, 8:57 AM

## 2017-05-13 NOTE — Progress Notes (Addendum)
ANTICOAGULATION CONSULT NOTE  Pharmacy Consult for heparin Indication: CAD awaiting surgical consult vs PCI  No Known Allergies  Patient Measurements: Height: 5\' 8"  (172.7 cm) Weight: 228 lb 12.8 oz (103.8 kg) IBW/kg (Calculated) : 68.4 Heparin Dosing Weight: 90kg  Vital Signs: Temp: 98.5 F (36.9 C) (09/09 1200) Temp Source: Oral (09/09 1200) BP: 150/102 (09/09 1200) Pulse Rate: 72 (09/09 1200)  Labs:  Recent Labs  05/12/17 0204 05/12/17 0208 05/12/17 0747 05/12/17 1150 05/12/17 2034 05/13/17 0453 05/13/17 1117  HGB 17.2* 17.3* 17.3*  --   --  16.2  --   HCT 47.7 51.0 48.1  --   --  47.2  --   PLT 213  --  196  --   --  205  --   APTT 26  --   --   --   --   --   --   LABPROT 13.8  --   --   --   --   --   --   INR 1.07  --   --   --   --   --   --   HEPARINUNFRC  --   --   --   --  <0.10* 0.19* 0.31  CREATININE 1.07 1.10 1.05  --   --   --   --   TROPONINI 0.19*  --   --  4.67*  --   --   --     Estimated Creatinine Clearance: 90.7 mL/min (by C-G formula based on SCr of 1.05 mg/dL).   Medical History: Past Medical History:  Diagnosis Date  . Hyperlipidemia   . Hypertension   . Sleep apnea   . Tobacco use     Medications:  Prescriptions Prior to Admission  Medication Sig Dispense Refill Last Dose  . amLODipine (NORVASC) 5 MG tablet Take 1 tablet (5 mg total) by mouth daily. 90 tablet 3 Past Month at Unknown time  . atorvastatin (LIPITOR) 40 MG tablet Take 40 mg by mouth every evening.   0 Past Month at Unknown time  . lisinopril-hydrochlorothiazide (ZESTORETIC) 20-12.5 MG tablet Take 2 tablets by mouth daily. 180 tablet 3 Past Month at Unknown time  . Multiple Vitamin (MULTIVITAMIN WITH MINERALS) TABS tablet Take 1 tablet by mouth daily.   Past Month at Unknown time  . Vitamin D, Ergocalciferol, (DRISDOL) 50000 units CAPS capsule Take 1 capsule (50,000 Units total) by mouth every 7 (seven) days. 12 capsule 3 Past Month at Unknown time   Scheduled:  .  amLODipine  5 mg Oral Daily  . aspirin  81 mg Oral Daily  . atorvastatin  80 mg Oral q1800  . carvedilol  12.5 mg Oral BID WC  . lisinopril  40 mg Oral Daily  . potassium chloride  40 mEq Oral BID  . sodium chloride flush  3 mL Intravenous Q12H   Infusions:  . sodium chloride    . heparin 1,800 Units/hr (05/13/17 0603)  . nitroGLYCERIN Stopped (05/12/17 0447)    Assessment: 56yo [facility] for STEMI, moderate to severe CAD revealed in lab, now awaiting cardiothoracic consult for CABG vs PCI, continuing on heparin.  Heparin level remains therapeutic but at bottom of range at 0.31 with no infusion or bleed issues per RN. CBC stable and no s/s bleeding noted.   Goal of Therapy:  Heparin level 0.3-0.7 units/ml Monitor platelets by anticoagulation protocol: Yes   Plan:  Increase heparin gtt  slightly to 1850 units/hr to ensure stays in range Daily heparin level and CBC Monitor for s/s bleeding CABG scheduled for 9/10   Babs Bertin, PharmD, BCPS Clinical Pharmacist Rx Phone # for today: [phone] After 3:30PM, please call Main Rx: [phone] 05/13/2017 2:02 PM    ADDENDUM:  Heparin level remains therapeutic at bottom on range at 0.31 after slight rate increase. No issues with IV line or bleeding per RN.  Plan: Increase heparin gtt slightly to 1900 units/hr to ensure stays in range Daily heparin level and CBC Monitor for s/s bleeding CABG scheduled for 9/10  Babs Bertin, PharmD, BCPS Clinical Pharmacist 05/13/2017 9:02 PM

## 2017-05-14 ENCOUNTER — Inpatient Hospital Stay (HOSPITAL_COMMUNITY): Payer: BLUE CROSS/BLUE SHIELD

## 2017-05-14 ENCOUNTER — Inpatient Hospital Stay (HOSPITAL_COMMUNITY): Payer: BLUE CROSS/BLUE SHIELD | Admitting: Certified Registered Nurse Anesthetist

## 2017-05-14 ENCOUNTER — Encounter (HOSPITAL_COMMUNITY)
Admission: EM | Disposition: A | Discharge status: Home / Self Care | Payer: Self-pay | Source: Home / Self Care | Attending: Thoracic Surgery (Cardiothoracic Vascular Surgery)

## 2017-05-14 ENCOUNTER — Encounter (HOSPITAL_COMMUNITY): Payer: Self-pay | Admitting: Cardiology

## 2017-05-14 DIAGNOSIS — Z951 Presence of aortocoronary bypass graft: Secondary | ICD-10-CM

## 2017-05-14 HISTORY — PX: CORONARY ARTERY BYPASS GRAFT: SHX141

## 2017-05-14 HISTORY — DX: Presence of aortocoronary bypass graft: Z95.1

## 2017-05-14 HISTORY — PX: TEE WITHOUT CARDIOVERSION: SHX5443

## 2017-05-14 LAB — CBC
HCT: 45.9 % (ref 39.0–52.0)
HCT: 41.2 % (ref 39.0–52.0)
HCT: 40.2 % (ref 39.0–52.0)
Hemoglobin: 15.4 g/dL (ref 13.0–17.0)
Hemoglobin: 14 g/dL (ref 13.0–17.0)
Hemoglobin: 13.7 g/dL (ref 13.0–17.0)
MCH: 29.8 pg (ref 26.0–34.0)
MCH: 30.2 pg (ref 26.0–34.0)
MCH: 30.4 pg (ref 26.0–34.0)
MCHC: 33.6 g/dL (ref 30.0–36.0)
MCHC: 34 g/dL (ref 30.0–36.0)
MCHC: 34.1 g/dL (ref 30.0–36.0)
MCV: 89 fL (ref 78.0–100.0)
MCV: 89 fL (ref 78.0–100.0)
MCV: 89.3 fL (ref 78.0–100.0)
Platelets: 193 10*3/uL (ref 150–400)
Platelets: 139 10*3/uL — ABNORMAL LOW (ref 150–400)
Platelets: 155 10*3/uL (ref 150–400)
RBC: 5.16 MIL/uL (ref 4.22–5.81)
RBC: 4.63 MIL/uL (ref 4.22–5.81)
RBC: 4.5 MIL/uL (ref 4.22–5.81)
RDW: 14.1 % (ref 11.5–15.5)
RDW: 14.6 % (ref 11.5–15.5)
RDW: 14.6 % (ref 11.5–15.5)
WBC: 10.6 10*3/uL — ABNORMAL HIGH (ref 4.0–10.5)
WBC: 13.5 10*3/uL — ABNORMAL HIGH (ref 4.0–10.5)
WBC: 12.5 10*3/uL — ABNORMAL HIGH (ref 4.0–10.5)

## 2017-05-14 LAB — POCT I-STAT, CHEM 8
BUN: 24 mg/dL — ABNORMAL HIGH (ref 6–20)
BUN: 21 mg/dL — ABNORMAL HIGH (ref 6–20)
BUN: 23 mg/dL — ABNORMAL HIGH (ref 6–20)
BUN: 23 mg/dL — ABNORMAL HIGH (ref 6–20)
BUN: 24 mg/dL — ABNORMAL HIGH (ref 6–20)
BUN: 25 mg/dL — ABNORMAL HIGH (ref 6–20)
BUN: 26 mg/dL — ABNORMAL HIGH (ref 6–20)
BUN: 22 mg/dL — ABNORMAL HIGH (ref 6–20)
Calcium, Ion: 1.28 mmol/L (ref 1.15–1.40)
Calcium, Ion: 1.08 mmol/L — ABNORMAL LOW (ref 1.15–1.40)
Calcium, Ion: 1.21 mmol/L (ref 1.15–1.40)
Calcium, Ion: 1.09 mmol/L — ABNORMAL LOW (ref 1.15–1.40)
Calcium, Ion: 1.08 mmol/L — ABNORMAL LOW (ref 1.15–1.40)
Calcium, Ion: 1.09 mmol/L — ABNORMAL LOW (ref 1.15–1.40)
Calcium, Ion: 1.18 mmol/L (ref 1.15–1.40)
Calcium, Ion: 1.17 mmol/L (ref 1.15–1.40)
Chloride: 104 mmol/L (ref 101–111)
Chloride: 105 mmol/L (ref 101–111)
Chloride: 108 mmol/L (ref 101–111)
Chloride: 103 mmol/L (ref 101–111)
Chloride: 104 mmol/L (ref 101–111)
Chloride: 105 mmol/L (ref 101–111)
Chloride: 105 mmol/L (ref 101–111)
Chloride: 106 mmol/L (ref 101–111)
Creatinine, Ser: 1.3 mg/dL — ABNORMAL HIGH (ref 0.61–1.24)
Creatinine, Ser: 1.3 mg/dL — ABNORMAL HIGH (ref 0.61–1.24)
Creatinine, Ser: 1.6 mg/dL — ABNORMAL HIGH (ref 0.61–1.24)
Creatinine, Ser: 1.4 mg/dL — ABNORMAL HIGH (ref 0.61–1.24)
Creatinine, Ser: 1.4 mg/dL — ABNORMAL HIGH (ref 0.61–1.24)
Creatinine, Ser: 1.5 mg/dL — ABNORMAL HIGH (ref 0.61–1.24)
Creatinine, Ser: 1.5 mg/dL — ABNORMAL HIGH (ref 0.61–1.24)
Creatinine, Ser: 1.1 mg/dL (ref 0.61–1.24)
Glucose, Bld: 117 mg/dL — ABNORMAL HIGH (ref 65–99)
Glucose, Bld: 107 mg/dL — ABNORMAL HIGH (ref 65–99)
Glucose, Bld: 109 mg/dL — ABNORMAL HIGH (ref 65–99)
Glucose, Bld: 126 mg/dL — ABNORMAL HIGH (ref 65–99)
Glucose, Bld: 138 mg/dL — ABNORMAL HIGH (ref 65–99)
Glucose, Bld: 143 mg/dL — ABNORMAL HIGH (ref 65–99)
Glucose, Bld: 157 mg/dL — ABNORMAL HIGH (ref 65–99)
Glucose, Bld: 125 mg/dL — ABNORMAL HIGH (ref 65–99)
HCT: 45 % (ref 39.0–52.0)
HCT: 33 % — ABNORMAL LOW (ref 39.0–52.0)
HCT: 39 % (ref 39.0–52.0)
HCT: 31 % — ABNORMAL LOW (ref 39.0–52.0)
HCT: 31 % — ABNORMAL LOW (ref 39.0–52.0)
HCT: 34 % — ABNORMAL LOW (ref 39.0–52.0)
HCT: 34 % — ABNORMAL LOW (ref 39.0–52.0)
HCT: 39 % (ref 39.0–52.0)
Hemoglobin: 15.3 g/dL (ref 13.0–17.0)
Hemoglobin: 11.2 g/dL — ABNORMAL LOW (ref 13.0–17.0)
Hemoglobin: 13.3 g/dL (ref 13.0–17.0)
Hemoglobin: 10.5 g/dL — ABNORMAL LOW (ref 13.0–17.0)
Hemoglobin: 10.5 g/dL — ABNORMAL LOW (ref 13.0–17.0)
Hemoglobin: 11.6 g/dL — ABNORMAL LOW (ref 13.0–17.0)
Hemoglobin: 11.6 g/dL — ABNORMAL LOW (ref 13.0–17.0)
Hemoglobin: 13.3 g/dL (ref 13.0–17.0)
Potassium: 3.8 mmol/L (ref 3.5–5.1)
Potassium: 4 mmol/L (ref 3.5–5.1)
Potassium: 4.1 mmol/L (ref 3.5–5.1)
Potassium: 4.8 mmol/L (ref 3.5–5.1)
Potassium: 5.4 mmol/L — ABNORMAL HIGH (ref 3.5–5.1)
Potassium: 5.6 mmol/L — ABNORMAL HIGH (ref 3.5–5.1)
Potassium: 4.8 mmol/L (ref 3.5–5.1)
Potassium: 4.1 mmol/L (ref 3.5–5.1)
Sodium: 141 mmol/L (ref 135–145)
Sodium: 140 mmol/L (ref 135–145)
Sodium: 140 mmol/L (ref 135–145)
Sodium: 136 mmol/L (ref 135–145)
Sodium: 135 mmol/L (ref 135–145)
Sodium: 135 mmol/L (ref 135–145)
Sodium: 138 mmol/L (ref 135–145)
Sodium: 142 mmol/L (ref 135–145)
TCO2: 26 mmol/L (ref 22–32)
TCO2: 21 mmol/L — ABNORMAL LOW (ref 22–32)
TCO2: 24 mmol/L (ref 22–32)
TCO2: 25 mmol/L (ref 22–32)
TCO2: 23 mmol/L (ref 22–32)
TCO2: 25 mmol/L (ref 22–32)
TCO2: 21 mmol/L — ABNORMAL LOW (ref 22–32)
TCO2: 24 mmol/L (ref 22–32)

## 2017-05-14 LAB — GLUCOSE, CAPILLARY
Glucose-Capillary: 115 mg/dL — ABNORMAL HIGH (ref 65–99)
Glucose-Capillary: 117 mg/dL — ABNORMAL HIGH (ref 65–99)
Glucose-Capillary: 120 mg/dL — ABNORMAL HIGH (ref 65–99)
Glucose-Capillary: 123 mg/dL — ABNORMAL HIGH (ref 65–99)
Glucose-Capillary: 123 mg/dL — ABNORMAL HIGH (ref 65–99)
Glucose-Capillary: 126 mg/dL — ABNORMAL HIGH (ref 65–99)
Glucose-Capillary: 126 mg/dL — ABNORMAL HIGH (ref 65–99)
Glucose-Capillary: 129 mg/dL — ABNORMAL HIGH (ref 65–99)

## 2017-05-14 LAB — POCT I-STAT 3, ART BLOOD GAS (G3+)
Acid-base deficit: 3 mmol/L — ABNORMAL HIGH (ref 0.0–2.0)
Acid-base deficit: 5 mmol/L — ABNORMAL HIGH (ref 0.0–2.0)
Acid-base deficit: 5 mmol/L — ABNORMAL HIGH (ref 0.0–2.0)
Acid-base deficit: 3 mmol/L — ABNORMAL HIGH (ref 0.0–2.0)
Acid-base deficit: 1 mmol/L (ref 0.0–2.0)
Bicarbonate: 24.9 mmol/L (ref 20.0–28.0)
Bicarbonate: 22.3 mmol/L (ref 20.0–28.0)
Bicarbonate: 20.2 mmol/L (ref 20.0–28.0)
Bicarbonate: 21.2 mmol/L (ref 20.0–28.0)
Bicarbonate: 22 mmol/L (ref 20.0–28.0)
Bicarbonate: 23.8 mmol/L (ref 20.0–28.0)
O2 Saturation: 100 %
O2 Saturation: 82 %
O2 Saturation: 92 %
O2 Saturation: 99 %
O2 Saturation: 97 %
O2 Saturation: 97 %
Patient temperature: 37.3
Patient temperature: 37.3
Patient temperature: 37.3
Patient temperature: 37.8
TCO2: 26 mmol/L (ref 22–32)
TCO2: 24 mmol/L (ref 22–32)
TCO2: 21 mmol/L — ABNORMAL LOW (ref 22–32)
TCO2: 22 mmol/L (ref 22–32)
TCO2: 23 mmol/L (ref 22–32)
TCO2: 25 mmol/L (ref 22–32)
pCO2 arterial: 39 mmHg (ref 32.0–48.0)
pCO2 arterial: 41.9 mmHg (ref 32.0–48.0)
pCO2 arterial: 39 mmHg (ref 32.0–48.0)
pCO2 arterial: 41 mmHg (ref 32.0–48.0)
pCO2 arterial: 37.3 mmHg (ref 32.0–48.0)
pCO2 arterial: 39.2 mmHg (ref 32.0–48.0)
pH, Arterial: 7.413 (ref 7.350–7.450)
pH, Arterial: 7.335 — ABNORMAL LOW (ref 7.350–7.450)
pH, Arterial: 7.323 — ABNORMAL LOW (ref 7.350–7.450)
pH, Arterial: 7.323 — ABNORMAL LOW (ref 7.350–7.450)
pH, Arterial: 7.38 (ref 7.350–7.450)
pH, Arterial: 7.394 (ref 7.350–7.450)
pO2, Arterial: 386 mmHg — ABNORMAL HIGH (ref 83.0–108.0)
pO2, Arterial: 49 mmHg — ABNORMAL LOW (ref 83.0–108.0)
pO2, Arterial: 69 mmHg — ABNORMAL LOW (ref 83.0–108.0)
pO2, Arterial: 161 mmHg — ABNORMAL HIGH (ref 83.0–108.0)
pO2, Arterial: 94 mmHg (ref 83.0–108.0)
pO2, Arterial: 95 mmHg (ref 83.0–108.0)

## 2017-05-14 LAB — POCT I-STAT 4, (NA,K, GLUC, HGB,HCT)
Glucose, Bld: 123 mg/dL — ABNORMAL HIGH (ref 65–99)
HCT: 39 % (ref 39.0–52.0)
Hemoglobin: 13.3 g/dL (ref 13.0–17.0)
Potassium: 4.3 mmol/L (ref 3.5–5.1)
Sodium: 139 mmol/L (ref 135–145)

## 2017-05-14 LAB — SURGICAL PCR SCREEN
MRSA, PCR: NEGATIVE
Staphylococcus aureus: NEGATIVE

## 2017-05-14 LAB — CREATININE, SERUM
Creatinine, Ser: 1.36 mg/dL — ABNORMAL HIGH (ref 0.61–1.24)
GFR calc Af Amer: >60 mL/min (ref >60)
GFR calc non Af Amer: 56 mL/min — ABNORMAL LOW (ref >60)

## 2017-05-14 LAB — BASIC METABOLIC PANEL
Anion gap: 8 (ref 5–15)
BUN: 18 mg/dL (ref 6–20)
CO2: 23 mmol/L (ref 22–32)
Calcium: 8.8 mg/dL — ABNORMAL LOW (ref 8.9–10.3)
Chloride: 107 mmol/L (ref 101–111)
Creatinine, Ser: 1.44 mg/dL — ABNORMAL HIGH (ref 0.61–1.24)
GFR calc Af Amer: >60 mL/min (ref >60)
GFR calc non Af Amer: 53 mL/min — ABNORMAL LOW (ref >60)
Glucose, Bld: 114 mg/dL — ABNORMAL HIGH (ref 65–99)
Potassium: 3.8 mmol/L (ref 3.5–5.1)
Sodium: 138 mmol/L (ref 135–145)

## 2017-05-14 LAB — ABO/RH: ABO/RH(D): AB POS

## 2017-05-14 LAB — HEPARIN LEVEL (UNFRACTIONATED): Heparin Unfractionated: 0.36 IU/mL (ref 0.30–0.70)

## 2017-05-14 LAB — HEMOGLOBIN AND HEMATOCRIT, BLOOD
HCT: 34.1 % — ABNORMAL LOW (ref 39.0–52.0)
Hemoglobin: 11.6 g/dL — ABNORMAL LOW (ref 13.0–17.0)

## 2017-05-14 LAB — PROTIME-INR
INR: 1.28
Prothrombin Time: 15.9 seconds — ABNORMAL HIGH (ref 11.4–15.2)

## 2017-05-14 LAB — MAGNESIUM: Magnesium: 2.9 mg/dL — ABNORMAL HIGH (ref 1.7–2.4)

## 2017-05-14 LAB — PLATELET COUNT: Platelets: 178 10*3/uL (ref 150–400)

## 2017-05-14 LAB — POCT ACTIVATED CLOTTING TIME: Activated Clotting Time: 180 seconds

## 2017-05-14 LAB — APTT: aPTT: 30 seconds (ref 24–36)

## 2017-05-14 SURGERY — CORONARY ARTERY BYPASS GRAFTING (CABG)
Anesthesia: General | Site: Chest

## 2017-05-14 MED ORDER — LACTATED RINGERS IV SOLN
INTRAVENOUS | Status: DC | PRN
  Administered 2017-05-14: 08:00:00 via INTRAVENOUS

## 2017-05-14 MED ORDER — MIDAZOLAM HCL 10 MG/2ML IJ SOLN
INTRAMUSCULAR | Status: AC
  Filled 2017-05-14: qty 2

## 2017-05-14 MED ORDER — VANCOMYCIN HCL IN DEXTROSE 1-5 GM/200ML-% IV SOLN
1000.0000 mg | Freq: Once | INTRAVENOUS | Status: AC
  Administered 2017-05-14: 1000 mg via INTRAVENOUS
  Filled 2017-05-14: qty 200

## 2017-05-14 MED ORDER — CALCIUM CHLORIDE 10 % IV SOLN
INTRAVENOUS | Status: DC | PRN
  Administered 2017-05-14: 300 mg via INTRAVENOUS

## 2017-05-14 MED ORDER — SODIUM CHLORIDE 0.9 % IV SOLN
0.0000 ug/kg/h | INTRAVENOUS | Status: DC
  Administered 2017-05-14: 0.2 ug/kg/h via INTRAVENOUS
  Filled 2017-05-14 (×2): qty 2

## 2017-05-14 MED ORDER — ACETAMINOPHEN 160 MG/5ML PO SOLN: 1000.0000 mg | Freq: Four times a day (QID) | ORAL | Status: DC

## 2017-05-14 MED ORDER — 0.9 % SODIUM CHLORIDE (POUR BTL) OPTIME
TOPICAL | Status: DC | PRN
  Administered 2017-05-14: 1000 mL

## 2017-05-14 MED ORDER — SODIUM CHLORIDE 0.9 % IV SOLN: INTRAVENOUS | Status: DC

## 2017-05-14 MED ORDER — PHENYLEPHRINE HCL 10 MG/ML IJ SOLN
INTRAVENOUS | Status: DC | PRN
  Administered 2017-05-14: 25 ug/min via INTRAVENOUS

## 2017-05-14 MED ORDER — INSULIN REGULAR BOLUS VIA INFUSION
0.0000 [IU] | Freq: Three times a day (TID) | INTRAVENOUS | Status: DC
  Filled 2017-05-14: qty 10

## 2017-05-14 MED ORDER — ALBUMIN HUMAN 5 % IV SOLN
250.0000 mL | INTRAVENOUS | Status: AC | PRN
  Administered 2017-05-14: 250 mL via INTRAVENOUS

## 2017-05-14 MED ORDER — ONDANSETRON HCL 4 MG/2ML IJ SOLN: 4.0000 mg | Freq: Four times a day (QID) | INTRAMUSCULAR | Status: DC | PRN

## 2017-05-14 MED ORDER — HEMOSTATIC AGENTS (NO CHARGE) OPTIME
TOPICAL | Status: DC | PRN
  Administered 2017-05-14: 1 via TOPICAL

## 2017-05-14 MED ORDER — METOPROLOL TARTRATE 12.5 MG HALF TABLET
12.5000 mg | ORAL_TABLET | Freq: Two times a day (BID) | ORAL | Status: DC
  Administered 2017-05-15 (×2): 12.5 mg via ORAL
  Filled 2017-05-14 (×2): qty 1

## 2017-05-14 MED ORDER — ACETAMINOPHEN 160 MG/5ML PO SOLN: 650.0000 mg | Freq: Once | ORAL | Status: AC

## 2017-05-14 MED ORDER — POTASSIUM CHLORIDE 10 MEQ/50ML IV SOLN: 10.0000 meq | INTRAVENOUS | Status: AC

## 2017-05-14 MED ORDER — LACTATED RINGERS IV SOLN
INTRAVENOUS | Status: DC | PRN
  Administered 2017-05-14: 09:00:00 via INTRAVENOUS

## 2017-05-14 MED ORDER — LACTATED RINGERS IV SOLN
INTRAVENOUS | Status: DC | PRN
  Administered 2017-05-14 (×2): via INTRAVENOUS

## 2017-05-14 MED ORDER — PANTOPRAZOLE SODIUM 40 MG PO TBEC
40.0000 mg | DELAYED_RELEASE_TABLET | Freq: Every day | ORAL | Status: DC
  Administered 2017-05-16 – 2017-05-18 (×3): 40 mg via ORAL
  Filled 2017-05-14 (×3): qty 1

## 2017-05-14 MED ORDER — FENTANYL CITRATE (PF) 250 MCG/5ML IJ SOLN
INTRAMUSCULAR | Status: AC
  Filled 2017-05-14: qty 10

## 2017-05-14 MED ORDER — SODIUM BICARBONATE 8.4 % IV SOLN
50.0000 meq | Freq: Once | INTRAVENOUS | Status: AC
  Administered 2017-05-14: 50 meq via INTRAVENOUS

## 2017-05-14 MED ORDER — PHENYLEPHRINE 40 MCG/ML (10ML) SYRINGE FOR IV PUSH (FOR BLOOD PRESSURE SUPPORT)
PREFILLED_SYRINGE | INTRAVENOUS | Status: AC
  Filled 2017-05-14: qty 10

## 2017-05-14 MED ORDER — DOCUSATE SODIUM 100 MG PO CAPS
200.0000 mg | ORAL_CAPSULE | Freq: Every day | ORAL | Status: DC
  Administered 2017-05-15 – 2017-05-16 (×2): 200 mg via ORAL
  Filled 2017-05-14 (×3): qty 2

## 2017-05-14 MED ORDER — SODIUM CHLORIDE 0.9 % IV SOLN: 250.0000 mL | INTRAVENOUS | Status: DC

## 2017-05-14 MED ORDER — BISACODYL 10 MG RE SUPP: 10.0000 mg | Freq: Every day | RECTAL | Status: DC

## 2017-05-14 MED ORDER — LIDOCAINE 2% (20 MG/ML) 5 ML SYRINGE
INTRAMUSCULAR | Status: AC
  Filled 2017-05-14: qty 5

## 2017-05-14 MED ORDER — ACETAMINOPHEN 500 MG PO TABS
1000.0000 mg | ORAL_TABLET | Freq: Four times a day (QID) | ORAL | Status: DC
  Administered 2017-05-15 – 2017-05-18 (×13): 1000 mg via ORAL
  Filled 2017-05-14 (×13): qty 2

## 2017-05-14 MED ORDER — MORPHINE SULFATE (PF) 2 MG/ML IV SOLN: 2.0000 mg | INTRAVENOUS | Status: DC | PRN

## 2017-05-14 MED ORDER — HEPARIN SODIUM (PORCINE) 1000 UNIT/ML IJ SOLN
INTRAMUSCULAR | Status: DC | PRN
  Administered 2017-05-14: 33000 [IU] via INTRAVENOUS
  Administered 2017-05-14: 5000 [IU] via INTRAVENOUS
  Administered 2017-05-14 (×2): 2000 [IU] via INTRAVENOUS
  Administered 2017-05-14: 10000 [IU] via INTRAVENOUS

## 2017-05-14 MED ORDER — PHENYLEPHRINE HCL 10 MG/ML IJ SOLN
INTRAMUSCULAR | Status: DC | PRN
  Administered 2017-05-14: 80 ug via INTRAVENOUS
  Administered 2017-05-14: 40 ug via INTRAVENOUS
  Administered 2017-05-14 (×2): 80 ug via INTRAVENOUS

## 2017-05-14 MED ORDER — ACETAMINOPHEN 650 MG RE SUPP
650.0000 mg | Freq: Once | RECTAL | Status: AC
  Administered 2017-05-14: 650 mg via RECTAL

## 2017-05-14 MED ORDER — PROTAMINE SULFATE 10 MG/ML IV SOLN
INTRAVENOUS | Status: DC | PRN
  Administered 2017-05-14: 390 mg via INTRAVENOUS
  Administered 2017-05-14: 10 mg via INTRAVENOUS

## 2017-05-14 MED ORDER — METOPROLOL TARTRATE 5 MG/5ML IV SOLN: 2.5000 mg | INTRAVENOUS | Status: DC | PRN

## 2017-05-14 MED ORDER — SODIUM CHLORIDE 0.9% FLUSH: 10.0000 mL | INTRAVENOUS | Status: DC | PRN

## 2017-05-14 MED ORDER — EPHEDRINE SULFATE 50 MG/ML IJ SOLN
INTRAMUSCULAR | Status: DC | PRN
  Administered 2017-05-14: 10 mg via INTRAVENOUS

## 2017-05-14 MED ORDER — LACTATED RINGERS IV SOLN: INTRAVENOUS | Status: DC

## 2017-05-14 MED ORDER — MIDAZOLAM HCL 2 MG/2ML IJ SOLN
2.0000 mg | INTRAMUSCULAR | Status: DC | PRN
  Administered 2017-05-14: 2 mg via INTRAVENOUS
  Filled 2017-05-14: qty 2

## 2017-05-14 MED ORDER — ASPIRIN 81 MG PO CHEW: 324.0000 mg | CHEWABLE_TABLET | Freq: Every day | ORAL | Status: DC

## 2017-05-14 MED ORDER — ROCURONIUM BROMIDE 10 MG/ML (PF) SYRINGE
PREFILLED_SYRINGE | INTRAVENOUS | Status: AC
  Filled 2017-05-14: qty 5

## 2017-05-14 MED ORDER — MORPHINE SULFATE (PF) 4 MG/ML IV SOLN
2.0000 mg | INTRAVENOUS | Status: DC | PRN
  Administered 2017-05-14: 4 mg via INTRAVENOUS
  Administered 2017-05-15 (×4): 2 mg via INTRAVENOUS
  Filled 2017-05-14 (×4): qty 1

## 2017-05-14 MED ORDER — FENTANYL CITRATE (PF) 250 MCG/5ML IJ SOLN
INTRAMUSCULAR | Status: DC | PRN
  Administered 2017-05-14: 350 ug via INTRAVENOUS
  Administered 2017-05-14: 100 ug via INTRAVENOUS
  Administered 2017-05-14: 200 ug via INTRAVENOUS
  Administered 2017-05-14: 350 ug via INTRAVENOUS
  Administered 2017-05-14: 250 ug via INTRAVENOUS

## 2017-05-14 MED ORDER — SODIUM CHLORIDE 0.9 % IV SOLN
0.0000 ug/min | INTRAVENOUS | Status: DC
  Filled 2017-05-14: qty 2

## 2017-05-14 MED ORDER — PROPOFOL 10 MG/ML IV BOLUS
INTRAVENOUS | Status: DC | PRN
  Administered 2017-05-14 (×2): 40 mg via INTRAVENOUS
  Administered 2017-05-14 (×2): 20 mg via INTRAVENOUS

## 2017-05-14 MED ORDER — MAGNESIUM SULFATE 4 GM/100ML IV SOLN
4.0000 g | Freq: Once | INTRAVENOUS | Status: AC
  Administered 2017-05-14: 4 g via INTRAVENOUS
  Filled 2017-05-14: qty 100

## 2017-05-14 MED ORDER — ALBUTEROL SULFATE HFA 108 (90 BASE) MCG/ACT IN AERS
INHALATION_SPRAY | RESPIRATORY_TRACT | Status: DC | PRN
  Administered 2017-05-14 (×3): 5 via RESPIRATORY_TRACT

## 2017-05-14 MED ORDER — CEFUROXIME SODIUM 1.5 G IV SOLR
1.5000 g | Freq: Two times a day (BID) | INTRAVENOUS | Status: DC
  Administered 2017-05-14 – 2017-05-15 (×3): 1.5 g via INTRAVENOUS
  Filled 2017-05-14 (×4): qty 1.5

## 2017-05-14 MED ORDER — MIDAZOLAM HCL 5 MG/5ML IJ SOLN
INTRAMUSCULAR | Status: DC | PRN
  Administered 2017-05-14 (×3): 2 mg via INTRAVENOUS

## 2017-05-14 MED ORDER — LACTATED RINGERS IV SOLN: 500.0000 mL | Freq: Once | INTRAVENOUS | Status: DC | PRN

## 2017-05-14 MED ORDER — CHLORHEXIDINE GLUCONATE 0.12% ORAL RINSE (MEDLINE KIT)
15.0000 mL | Freq: Two times a day (BID) | OROMUCOSAL | Status: DC
  Administered 2017-05-14 – 2017-05-15 (×2): 15 mL via OROMUCOSAL

## 2017-05-14 MED ORDER — BISACODYL 5 MG PO TBEC
10.0000 mg | DELAYED_RELEASE_TABLET | Freq: Every day | ORAL | Status: DC
  Administered 2017-05-15 – 2017-05-16 (×2): 10 mg via ORAL
  Filled 2017-05-14 (×2): qty 2

## 2017-05-14 MED ORDER — SODIUM CHLORIDE 0.45 % IV SOLN
INTRAVENOUS | Status: DC | PRN
  Administered 2017-05-14: 16:00:00 via INTRAVENOUS

## 2017-05-14 MED ORDER — OXYCODONE HCL 5 MG PO TABS
5.0000 mg | ORAL_TABLET | ORAL | Status: DC | PRN
  Administered 2017-05-15 – 2017-05-17 (×2): 10 mg via ORAL
  Filled 2017-05-14 (×2): qty 2

## 2017-05-14 MED ORDER — HEPARIN SODIUM (PORCINE) 1000 UNIT/ML IJ SOLN
INTRAMUSCULAR | Status: AC
  Filled 2017-05-14: qty 1

## 2017-05-14 MED ORDER — SODIUM CHLORIDE 0.9% FLUSH
3.0000 mL | Freq: Two times a day (BID) | INTRAVENOUS | Status: DC
  Administered 2017-05-15 (×2): 3 mL via INTRAVENOUS

## 2017-05-14 MED ORDER — SODIUM CHLORIDE 0.9 % IV SOLN
INTRAVENOUS | Status: DC
  Administered 2017-05-14: 2.5 [IU]/h via INTRAVENOUS
  Filled 2017-05-14: qty 1

## 2017-05-14 MED ORDER — ROCURONIUM BROMIDE 10 MG/ML (PF) SYRINGE
PREFILLED_SYRINGE | INTRAVENOUS | Status: DC | PRN
  Administered 2017-05-14: 50 mg via INTRAVENOUS
  Administered 2017-05-14: 100 mg via INTRAVENOUS
  Administered 2017-05-14 (×3): 50 mg via INTRAVENOUS

## 2017-05-14 MED ORDER — ORAL CARE MOUTH RINSE
15.0000 mL | Freq: Four times a day (QID) | OROMUCOSAL | Status: DC
  Administered 2017-05-15 (×3): 15 mL via OROMUCOSAL

## 2017-05-14 MED ORDER — CHLORHEXIDINE GLUCONATE 0.12 % MT SOLN
15.0000 mL | OROMUCOSAL | Status: AC
  Administered 2017-05-14: 15 mL via OROMUCOSAL

## 2017-05-14 MED ORDER — NITROGLYCERIN IN D5W 200-5 MCG/ML-% IV SOLN
0.0000 ug/min | INTRAVENOUS | Status: DC
Start: 2017-05-14 — End: 2017-05-16

## 2017-05-14 MED ORDER — SODIUM CHLORIDE 0.9% FLUSH
10.0000 mL | Freq: Two times a day (BID) | INTRAVENOUS | Status: DC
  Administered 2017-05-15: 10 mL

## 2017-05-14 MED ORDER — FENTANYL CITRATE (PF) 250 MCG/5ML IJ SOLN
INTRAMUSCULAR | Status: AC
  Filled 2017-05-14: qty 5

## 2017-05-14 MED ORDER — MORPHINE SULFATE (PF) 2 MG/ML IV SOLN: 1.0000 mg | INTRAVENOUS | Status: AC | PRN

## 2017-05-14 MED ORDER — PROTAMINE SULFATE 10 MG/ML IV SOLN
INTRAVENOUS | Status: AC
  Filled 2017-05-14: qty 25

## 2017-05-14 MED ORDER — ASPIRIN EC 325 MG PO TBEC
325.0000 mg | DELAYED_RELEASE_TABLET | Freq: Every day | ORAL | Status: DC
  Administered 2017-05-15 – 2017-05-18 (×4): 325 mg via ORAL
  Filled 2017-05-14 (×4): qty 1

## 2017-05-14 MED ORDER — TRAMADOL HCL 50 MG PO TABS
50.0000 mg | ORAL_TABLET | ORAL | Status: DC | PRN
  Administered 2017-05-16 (×2): 100 mg via ORAL
  Filled 2017-05-14 (×2): qty 2

## 2017-05-14 MED ORDER — CHLORHEXIDINE GLUCONATE CLOTH 2 % EX PADS
6.0000 | MEDICATED_PAD | Freq: Every day | CUTANEOUS | Status: DC
  Administered 2017-05-14 – 2017-05-15 (×2): 6 via TOPICAL

## 2017-05-14 MED ORDER — FAMOTIDINE IN NACL 20-0.9 MG/50ML-% IV SOLN
20.0000 mg | Freq: Two times a day (BID) | INTRAVENOUS | Status: AC
  Administered 2017-05-15: 20 mg via INTRAVENOUS
  Filled 2017-05-14: qty 50

## 2017-05-14 MED ORDER — SODIUM CHLORIDE 0.9% FLUSH: 3.0000 mL | INTRAVENOUS | Status: DC | PRN

## 2017-05-14 MED ORDER — PROPOFOL 10 MG/ML IV BOLUS
INTRAVENOUS | Status: AC
  Filled 2017-05-14: qty 20

## 2017-05-14 MED ORDER — METOPROLOL TARTRATE 25 MG/10 ML ORAL SUSPENSION
12.5000 mg | Freq: Two times a day (BID) | ORAL | Status: DC
  Administered 2017-05-14: 12.5 mg
  Filled 2017-05-14: qty 5

## 2017-05-14 SURGICAL SUPPLY — 102 items
BAG DECANTER FOR FLEXI CONT (MISCELLANEOUS) ×3 IMPLANT
BANDAGE ACE 4X5 VEL STRL LF (GAUZE/BANDAGES/DRESSINGS) ×3 IMPLANT
BANDAGE ACE 6X5 VEL STRL LF (GAUZE/BANDAGES/DRESSINGS) ×3 IMPLANT
BASKET HEART (ORDER IN 25'S) (MISCELLANEOUS) ×1
BASKET HEART (ORDER IN 25S) (MISCELLANEOUS) ×2 IMPLANT
BLADE STERNUM SYSTEM 6 (BLADE) ×3 IMPLANT
BNDG GAUZE ELAST 4 BULKY (GAUZE/BANDAGES/DRESSINGS) ×3 IMPLANT
CANISTER SUCT 3000ML PPV (MISCELLANEOUS) ×3 IMPLANT
CANNULA EZ GLIDE AORTIC 21FR (CANNULA) ×3 IMPLANT
CATH CPB KIT HENDRICKSON (MISCELLANEOUS) ×3 IMPLANT
CATH ROBINSON RED A/P 18FR (CATHETERS) ×3 IMPLANT
CATH THORACIC 36FR (CATHETERS) ×3 IMPLANT
CATH THORACIC 36FR RT ANG (CATHETERS) ×6 IMPLANT
CLIP VESOCCLUDE MED 24/CT (CLIP) IMPLANT
CLIP VESOCCLUDE SM WIDE 24/CT (CLIP) ×27 IMPLANT
CRADLE DONUT ADULT HEAD (MISCELLANEOUS) ×3 IMPLANT
DRAPE CARDIOVASCULAR INCISE (DRAPES) ×1
DRAPE SLUSH/WARMER DISC (DRAPES) ×3 IMPLANT
DRAPE SRG 135X102X78XABS (DRAPES) ×2 IMPLANT
DRSG COVADERM 4X14 (GAUZE/BANDAGES/DRESSINGS) ×3 IMPLANT
ELECT REM PT RETURN 9FT ADLT (ELECTROSURGICAL) ×6
ELECTRODE REM PT RTRN 9FT ADLT (ELECTROSURGICAL) ×4 IMPLANT
FELT TEFLON 1X6 (MISCELLANEOUS) ×6 IMPLANT
GAUZE SPONGE 4X4 12PLY STRL (GAUZE/BANDAGES/DRESSINGS) ×6 IMPLANT
GAUZE SPONGE 4X4 12PLY STRL LF (GAUZE/BANDAGES/DRESSINGS) ×6 IMPLANT
GLOVE BIO SURGEON STRL SZ 6 (GLOVE) ×9 IMPLANT
GLOVE BIO SURGEON STRL SZ 6.5 (GLOVE) ×12 IMPLANT
GLOVE BIO SURGEON STRL SZ7 (GLOVE) ×3 IMPLANT
GLOVE BIO SURGEON STRL SZ7.5 (GLOVE) ×18 IMPLANT
GLOVE BIOGEL PI IND STRL 6 (GLOVE) ×4 IMPLANT
GLOVE BIOGEL PI IND STRL 6.5 (GLOVE) ×2 IMPLANT
GLOVE BIOGEL PI INDICATOR 6 (GLOVE) ×2
GLOVE BIOGEL PI INDICATOR 6.5 (GLOVE) ×1
GLOVE SURG SIGNA 7.5 PF LTX (GLOVE) ×9 IMPLANT
GOWN STRL REUS W/ TWL LRG LVL3 (GOWN DISPOSABLE) ×8 IMPLANT
GOWN STRL REUS W/ TWL XL LVL3 (GOWN DISPOSABLE) ×4 IMPLANT
GOWN STRL REUS W/TWL LRG LVL3 (GOWN DISPOSABLE) ×4
GOWN STRL REUS W/TWL XL LVL3 (GOWN DISPOSABLE) ×2
HEMOSTAT POWDER SURGIFOAM 1G (HEMOSTASIS) ×9 IMPLANT
HEMOSTAT SURGICEL 2X14 (HEMOSTASIS) ×3 IMPLANT
INSERT FOGARTY XLG (MISCELLANEOUS) IMPLANT
KIT BASIN OR (CUSTOM PROCEDURE TRAY) ×3 IMPLANT
KIT ROOM TURNOVER OR (KITS) ×3 IMPLANT
KIT SUCTION CATH 14FR (SUCTIONS) ×6 IMPLANT
KIT VASOVIEW HEMOPRO VH 3000 (KITS) ×3 IMPLANT
MARKER GRAFT CORONARY BYPASS (MISCELLANEOUS) ×9 IMPLANT
NS IRRIG 1000ML POUR BTL (IV SOLUTION) ×15 IMPLANT
PACK OPEN HEART (CUSTOM PROCEDURE TRAY) ×3 IMPLANT
PAD ARMBOARD 7.5X6 YLW CONV (MISCELLANEOUS) ×6 IMPLANT
PAD ELECT DEFIB RADIOL ZOLL (MISCELLANEOUS) ×3 IMPLANT
PENCIL BUTTON HOLSTER BLD 10FT (ELECTRODE) ×3 IMPLANT
PUNCH AORTIC ROTATE 4.0MM (MISCELLANEOUS) IMPLANT
PUNCH AORTIC ROTATE 4.5MM 8IN (MISCELLANEOUS) ×3 IMPLANT
PUNCH AORTIC ROTATE 5MM 8IN (MISCELLANEOUS) IMPLANT
SET CARDIOPLEGIA MPS 5001102 (MISCELLANEOUS) ×3 IMPLANT
SPONGE LAP 18X18 X RAY DECT (DISPOSABLE) ×3 IMPLANT
SUT BONE WAX W31G (SUTURE) ×3 IMPLANT
SUT ETHIBOND 2 0 SH (SUTURE) ×4
SUT ETHIBOND 2 0 SH 36X2 (SUTURE) ×8 IMPLANT
SUT MNCRL AB 4-0 PS2 18 (SUTURE) IMPLANT
SUT PROLENE 3 0 SH DA (SUTURE) ×3 IMPLANT
SUT PROLENE 4 0 RB 1 (SUTURE) ×3
SUT PROLENE 4 0 SH DA (SUTURE) IMPLANT
SUT PROLENE 4-0 RB1 .5 CRCL 36 (SUTURE) ×6 IMPLANT
SUT PROLENE 5 0 C 1 36 (SUTURE) ×6 IMPLANT
SUT PROLENE 6 0 C 1 30 (SUTURE) ×15 IMPLANT
SUT PROLENE 7 0 BV1 MDA (SUTURE) ×9 IMPLANT
SUT PROLENE 8 0 BV175 6 (SUTURE) ×18 IMPLANT
SUT SILK  1 MH (SUTURE) ×4
SUT SILK 1 MH (SUTURE) ×8 IMPLANT
SUT SILK 1 TIES 10X30 (SUTURE) ×3 IMPLANT
SUT SILK 2 0 SH CR/8 (SUTURE) ×6 IMPLANT
SUT SILK 2 0 TIES 10X30 (SUTURE) ×3 IMPLANT
SUT SILK 2 0 TIES 17X18 (SUTURE) ×1
SUT SILK 2-0 18XBRD TIE BLK (SUTURE) ×2 IMPLANT
SUT SILK 3 0 SH CR/8 (SUTURE) ×3 IMPLANT
SUT SILK 4 0 TIE 10X30 (SUTURE) ×6 IMPLANT
SUT STEEL 6MS V (SUTURE) ×3 IMPLANT
SUT STEEL STERNAL CCS#1 18IN (SUTURE) IMPLANT
SUT STEEL SZ 6 DBL 3X14 BALL (SUTURE) ×3 IMPLANT
SUT TEM PAC WIRE 2 0 SH (SUTURE) ×12 IMPLANT
SUT VIC AB 1 CTX 36 (SUTURE) ×2
SUT VIC AB 1 CTX36XBRD ANBCTR (SUTURE) ×4 IMPLANT
SUT VIC AB 2-0 CT1 27 (SUTURE) ×1
SUT VIC AB 2-0 CT1 TAPERPNT 27 (SUTURE) ×2 IMPLANT
SUT VIC AB 2-0 CTX 27 (SUTURE) ×6 IMPLANT
SUT VIC AB 3-0 SH 27 (SUTURE)
SUT VIC AB 3-0 SH 27X BRD (SUTURE) IMPLANT
SUT VIC AB 3-0 X1 27 (SUTURE) ×12 IMPLANT
SUT VICRYL 4-0 PS2 18IN ABS (SUTURE) IMPLANT
SUTURE E-PAK OPEN HEART (SUTURE) ×3 IMPLANT
SYSTEM SAHARA CHEST DRAIN ATS (WOUND CARE) ×3 IMPLANT
TAPE CLOTH SURG 4X10 WHT LF (GAUZE/BANDAGES/DRESSINGS) ×3 IMPLANT
TOWEL GREEN STERILE (TOWEL DISPOSABLE) ×12 IMPLANT
TOWEL GREEN STERILE FF (TOWEL DISPOSABLE) ×6 IMPLANT
TOWEL OR 17X24 6PK STRL BLUE (TOWEL DISPOSABLE) ×6 IMPLANT
TOWEL OR 17X26 10 PK STRL BLUE (TOWEL DISPOSABLE) ×6 IMPLANT
TRAY FOLEY SILVER 16FR TEMP (SET/KITS/TRAYS/PACK) ×3 IMPLANT
TUBE FEEDING 8FR 16IN STR KANG (MISCELLANEOUS) ×3 IMPLANT
TUBING INSUFFLATION (TUBING) ×3 IMPLANT
UNDERPAD 30X30 (UNDERPADS AND DIAPERS) ×3 IMPLANT
WATER STERILE IRR 1000ML POUR (IV SOLUTION) ×6 IMPLANT

## 2017-05-14 NOTE — OR Nursing (Signed)
1st call SICU charge [id number].

## 2017-05-14 NOTE — Progress Notes (Signed)
Patient was transported to OR for CABG in stable condition.  Heparin was stopped at time of transport.

## 2017-05-14 NOTE — Anesthesia Procedure Notes (Signed)
Central Venous Catheter Insertion Performed by: Val EagleMOSER, Zachary Nole, anesthesiologist Start/End9/06/2017 8:47 AM, 05/14/2017 8:59 AM Patient location: Pre-op. Preanesthetic checklist: patient identified, IV checked, site marked, risks and benefits discussed, surgical consent, monitors and equipment checked, pre-op evaluation, timeout performed and anesthesia consent Hand hygiene performed  and maximum sterile barriers used  PA cath was placed.Swan type:thermodilution Procedure performed without using ultrasound guided technique. Attempts: 1 Patient tolerated the procedure well with no immediate complications.

## 2017-05-14 NOTE — Progress Notes (Signed)
Pt extubated at 22:45 per MD order by RT with this RN at bedside. Will continue to monitor. Pt educated on C&DB, IS.

## 2017-05-14 NOTE — Progress Notes (Signed)
Dr. Donata ClayVan Trigt paged regarding weaning mechanics outside parameters and Swan Ganz waveform flat despite flushing.   Per MD, pull Swan back 2cm, and extubate pt and collect another ABG in one hour.

## 2017-05-14 NOTE — Progress Notes (Signed)
CT surgery p.m. Rounds  Awake on ventilator Hemodynamics stable Minimal chest tube drainage Good urine output Continue postoperative protocol

## 2017-05-14 NOTE — Brief Op Note (Addendum)
05/12/2017 - 05/14/2017  2:34 PM  PATIENT:  Parker Sellers  56 y.o. male  PRE-OPERATIVE DIAGNOSIS:  CAD  POST-OPERATIVE DIAGNOSIS:  CAD  PROCEDURE:  Procedure(s): CORONARY ARTERY BYPASS GRAFTING (CABG)/EVH times five using bilateral internal mammary arteries and left saphenous vein. (N/A) TRANSESOPHAGEAL ECHOCARDIOGRAM (TEE) (N/A) LIMA to LAD SEQ FRIMA- to OM1-OM2 SEQ SVG to PD-PL  SURGEON:  Surgeon(s) and Role:    * Loreli SlotHendrickson, Steven C, MD - Primary  PHYSICIAN ASSISTANT: WAYNE GOLD PA-C  ANESTHESIA:   general  EBL:  Total I/O In: 2200 [I.V.:2200] Out: 300 [Urine:300]  BLOOD ADMINISTERED:none  DRAINS: 3 CHEST TUBES   LOCAL MEDICATIONS USED:  NONE  SPECIMEN:  No Specimen  DISPOSITION OF SPECIMEN:  N/A  COUNTS:  YES  TOURNIQUET:  * No tourniquets in log *  DICTATION: .Other Dictation: Dictation Number PENDING  PLAN OF CARE: Admit to inpatient   PATIENT DISPOSITION:  ICU - intubated and hemodynamically stable.   Delay start of Pharmacological VTE agent (>24hrs) due to surgical blood loss or risk of bleeding: yes  COMPLICATIONS: NO KNOWN  Vein fair quality, IMAs good quality PDA diffusely diseased, remaining targets good quality EF 45% with global hypkinesis by TEE

## 2017-05-14 NOTE — Progress Notes (Signed)
Spouse at bedside, would like a call once patient is extubated, will return around 11am 05/14/17.  Hermina BartersBOWMAN, Anel Purohit M, RN

## 2017-05-14 NOTE — Progress Notes (Signed)
Rapid wean protocol started at 21:40 by RT. Pt now on 40/4.  Will continue to monitor closely.

## 2017-05-14 NOTE — Anesthesia Procedure Notes (Signed)
Central Venous Catheter Insertion Performed by: Oleta Mouse, anesthesiologist Start/End9/06/2017 8:47 AM, 05/14/2017 8:56 AM Patient location: OR. Preanesthetic checklist: patient identified, IV checked, site marked, risks and benefits discussed, surgical consent, monitors and equipment checked, pre-op evaluation, timeout performed and anesthesia consent Lidocaine 1% used for infiltration and patient sedated Hand hygiene performed  and maximum sterile barriers used  Catheter size: 9 Fr Total catheter length 10. MAC introducer Procedure performed using ultrasound guided technique. Ultrasound Notes:anatomy identified, needle tip was noted to be adjacent to the nerve/plexus identified, no ultrasound evidence of intravascular and/or intraneural injection and image(s) printed for medical record Attempts: 1 Following insertion, line sutured and dressing applied. Post procedure assessment: blood return through all ports, free fluid flow and no air  Patient tolerated the procedure well with no immediate complications.

## 2017-05-14 NOTE — Transfer of Care (Signed)
Immediate Anesthesia Transfer of Care Note  Patient: Parker Sellers  Procedure(s) Performed: Procedure(s): CORONARY ARTERY BYPASS GRAFTING (CABG)/EVH times five using bilateral internal mammary arteries and left saphenous vein. (N/A) TRANSESOPHAGEAL ECHOCARDIOGRAM (TEE) (N/A)  Patient Location: SICU  Anesthesia Type:General  Level of Consciousness: sedated and Patient remains intubated per anesthesia plan  Airway & Oxygen Therapy: Patient remains intubated per anesthesia plan and Patient placed on Ventilator (see vital sign flow sheet for setting)  Post-op Assessment: Report given to RN and Post -op Vital signs reviewed and stable  Post vital signs: Reviewed and stable  Last Vitals:  Vitals:   05/14/17 0030 05/14/17 0417  BP: 116/67 131/83  Pulse: (!) 58 65  Resp: 20 (!) 22  Temp: 36.8 C 36.8 C  SpO2: 95% 97%    Last Pain:  Vitals:   05/14/17 0417  TempSrc: Oral  PainSc:          Complications: No apparent anesthesia complications

## 2017-05-14 NOTE — Anesthesia Procedure Notes (Signed)
Arterial Line Insertion Start/End9/06/2017 8:31 AM, 05/14/2017 8:36 AM  Patient location: OR. Preanesthetic checklist: patient identified, IV checked, risks and benefits discussed, surgical consent, monitors and equipment checked and pre-op evaluation Lidocaine 1% used for infiltration and patient sedated Left, radial was placed Catheter size: 20 G Hand hygiene performed  and maximum sterile barriers used  Allen's test indicative of satisfactory collateral circulation Procedure performed without using ultrasound guided technique. Following insertion, Biopatch. Post procedure assessment: decreased circulation, normal and numbness  Patient tolerated the procedure well with no immediate complications.

## 2017-05-14 NOTE — Anesthesia Preprocedure Evaluation (Addendum)
Anesthesia Evaluation  Patient identified by MRN, date of birth, ID band Patient awake    Reviewed: Allergy & Precautions, NPO status , Patient's Chart, lab work & pertinent test results, reviewed documented beta blocker date and time   History of Anesthesia Complications Negative for: history of anesthetic complications  Airway Mallampati: III  TM Distance: >3 FB Neck ROM: Full    Dental  (+) Teeth Intact,    Pulmonary shortness of breath, asthma , sleep apnea , COPD, former smoker,    breath sounds clear to auscultation       Cardiovascular hypertension, Pt. on medications and Pt. on home beta blockers + angina + CAD and + Past MI   Rhythm:Regular     Neuro/Psych negative neurological ROS  negative psych ROS   GI/Hepatic negative GI ROS, Neg liver ROS,   Endo/Other  Morbid obesity  Renal/GU Renal Insufficiency and ARFRenal disease     Musculoskeletal   Abdominal   Peds  Hematology negative hematology ROS (+)   Anesthesia Other Findings   Reproductive/Obstetrics                            Anesthesia Physical Anesthesia Plan  ASA: IV  Anesthesia Plan: General   Post-op Pain Management:    Induction: Intravenous  PONV Risk Score and Plan: 2 and Ondansetron and Dexamethasone  Airway Management Planned: Oral ETT  Additional Equipment: Arterial line, CVP, PA Cath, TEE and Ultrasound Guidance Line Placement  Intra-op Plan:   Post-operative Plan: Post-operative intubation/ventilation  Informed Consent: I have reviewed the patients History and Physical, chart, labs and discussed the procedure including the risks, benefits and alternatives for the proposed anesthesia with the patient or authorized representative who has indicated his/her understanding and acceptance.   Dental advisory given  Plan Discussed with: CRNA and Surgeon  Anesthesia Plan Comments:         Anesthesia  Quick Evaluation

## 2017-05-14 NOTE — Progress Notes (Signed)
ABG reviewed by Dr. Dorris FetchHendrickson, verbal order for 1 amp of bicarb.  Hermina BartersBOWMAN, Kenyen Candy M, RN

## 2017-05-14 NOTE — Procedures (Signed)
Extubation Procedure Note  Patient Details:   Name: Parker KeyMarvin E Mckenny DOB: 05/04/1961 MRN: [id number]   Airway Documentation:  NIF -30 VC 630ML.  Pt was extubated to 4L/ [place].  Pt SATS are in the upper 90's.  RT will continue to monitor.    Evaluation  O2 sats: stable throughout Complications: No apparent complications Patient did tolerate procedure well. Bilateral Breath Sounds: Clear, Diminished   Yes  Berton BonBlanca E Limon Nunez 05/14/2017, 10:52 PM

## 2017-05-14 NOTE — Progress Notes (Signed)
  Echocardiogram Echocardiogram Transesophageal has been performed.  Janalyn HarderWest, Murtaza Shell R 05/14/2017, 12:50 PM

## 2017-05-14 NOTE — Progress Notes (Signed)
RT note- Recruitment maneuver performed again, and ETT secured with E-tab.

## 2017-05-15 ENCOUNTER — Encounter (HOSPITAL_COMMUNITY): Payer: Self-pay

## 2017-05-15 ENCOUNTER — Inpatient Hospital Stay (HOSPITAL_COMMUNITY): Payer: BLUE CROSS/BLUE SHIELD

## 2017-05-15 LAB — CBC
HCT: 40.4 % (ref 39.0–52.0)
HCT: 39.5 % (ref 39.0–52.0)
Hemoglobin: 13.6 g/dL (ref 13.0–17.0)
Hemoglobin: 13 g/dL (ref 13.0–17.0)
MCH: 30 pg (ref 26.0–34.0)
MCH: 29.7 pg (ref 26.0–34.0)
MCHC: 33.7 g/dL (ref 30.0–36.0)
MCHC: 32.9 g/dL (ref 30.0–36.0)
MCV: 89.2 fL (ref 78.0–100.0)
MCV: 90.4 fL (ref 78.0–100.0)
Platelets: 162 10*3/uL (ref 150–400)
Platelets: 147 10*3/uL — ABNORMAL LOW (ref 150–400)
RBC: 4.53 MIL/uL (ref 4.22–5.81)
RBC: 4.37 MIL/uL (ref 4.22–5.81)
RDW: 14.5 % (ref 11.5–15.5)
RDW: 14.4 % (ref 11.5–15.5)
WBC: 12.3 10*3/uL — ABNORMAL HIGH (ref 4.0–10.5)
WBC: 12.5 10*3/uL — ABNORMAL HIGH (ref 4.0–10.5)

## 2017-05-15 LAB — BASIC METABOLIC PANEL
Anion gap: 5 (ref 5–15)
BUN: 18 mg/dL (ref 6–20)
CO2: 24 mmol/L (ref 22–32)
Calcium: 8.1 mg/dL — ABNORMAL LOW (ref 8.9–10.3)
Chloride: 108 mmol/L (ref 101–111)
Creatinine, Ser: 1.16 mg/dL (ref 0.61–1.24)
GFR calc Af Amer: >60 mL/min (ref >60)
GFR calc non Af Amer: >60 mL/min (ref >60)
Glucose, Bld: 113 mg/dL — ABNORMAL HIGH (ref 65–99)
Potassium: 3.7 mmol/L (ref 3.5–5.1)
Sodium: 137 mmol/L (ref 135–145)

## 2017-05-15 LAB — GLUCOSE, CAPILLARY
Glucose-Capillary: 100 mg/dL — ABNORMAL HIGH (ref 65–99)
Glucose-Capillary: 103 mg/dL — ABNORMAL HIGH (ref 65–99)
Glucose-Capillary: 111 mg/dL — ABNORMAL HIGH (ref 65–99)
Glucose-Capillary: 112 mg/dL — ABNORMAL HIGH (ref 65–99)
Glucose-Capillary: 112 mg/dL — ABNORMAL HIGH (ref 65–99)
Glucose-Capillary: 114 mg/dL — ABNORMAL HIGH (ref 65–99)
Glucose-Capillary: 114 mg/dL — ABNORMAL HIGH (ref 65–99)
Glucose-Capillary: 115 mg/dL — ABNORMAL HIGH (ref 65–99)
Glucose-Capillary: 120 mg/dL — ABNORMAL HIGH (ref 65–99)
Glucose-Capillary: 123 mg/dL — ABNORMAL HIGH (ref 65–99)
Glucose-Capillary: 140 mg/dL — ABNORMAL HIGH (ref 65–99)
Glucose-Capillary: 142 mg/dL — ABNORMAL HIGH (ref 65–99)
Glucose-Capillary: 82 mg/dL (ref 65–99)
Glucose-Capillary: 94 mg/dL (ref 65–99)

## 2017-05-15 LAB — CREATININE, SERUM
Creatinine, Ser: 1.49 mg/dL — ABNORMAL HIGH (ref 0.61–1.24)
GFR calc Af Amer: 58 mL/min — ABNORMAL LOW (ref >60)
GFR calc non Af Amer: 50 mL/min — ABNORMAL LOW (ref >60)

## 2017-05-15 LAB — POCT I-STAT 3, ART BLOOD GAS (G3+)
Acid-base deficit: 1 mmol/L (ref 0.0–2.0)
Bicarbonate: 23.5 mmol/L (ref 20.0–28.0)
O2 Saturation: 92 %
Patient temperature: 37.5
TCO2: 25 mmol/L (ref 22–32)
pCO2 arterial: 37.7 mmHg (ref 32.0–48.0)
pH, Arterial: 7.405 (ref 7.350–7.450)
pO2, Arterial: 63 mmHg — ABNORMAL LOW (ref 83.0–108.0)

## 2017-05-15 LAB — COOXEMETRY PANEL
Carboxyhemoglobin: 1.8 % — ABNORMAL HIGH (ref 0.5–1.5)
Methemoglobin: 1 % (ref 0.0–1.5)
O2 Saturation: 74.4 %
Total hemoglobin: 12.8 g/dL (ref 12.0–16.0)

## 2017-05-15 LAB — MAGNESIUM
Magnesium: 2.4 mg/dL (ref 1.7–2.4)
Magnesium: 2.4 mg/dL (ref 1.7–2.4)

## 2017-05-15 MED ORDER — POTASSIUM CHLORIDE 10 MEQ/50ML IV SOLN
10.0000 meq | INTRAVENOUS | Status: AC
  Administered 2017-05-15 (×3): 10 meq via INTRAVENOUS
  Filled 2017-05-15: qty 50

## 2017-05-15 MED ORDER — INSULIN DETEMIR 100 UNIT/ML ~~LOC~~ SOLN
25.0000 [IU] | Freq: Two times a day (BID) | SUBCUTANEOUS | Status: DC
  Administered 2017-05-15 (×2): 25 [IU] via SUBCUTANEOUS
  Filled 2017-05-15 (×3): qty 0.25

## 2017-05-15 MED ORDER — POTASSIUM CHLORIDE 10 MEQ/50ML IV SOLN
10.0000 meq | INTRAVENOUS | Status: AC
  Administered 2017-05-15 (×2): 10 meq via INTRAVENOUS
  Filled 2017-05-15 (×2): qty 50

## 2017-05-15 MED ORDER — FUROSEMIDE 10 MG/ML IJ SOLN
20.0000 mg | Freq: Once | INTRAMUSCULAR | Status: AC
  Administered 2017-05-15: 20 mg via INTRAVENOUS
  Filled 2017-05-15: qty 2

## 2017-05-15 MED ORDER — ENOXAPARIN SODIUM 40 MG/0.4ML ~~LOC~~ SOLN
40.0000 mg | Freq: Every day | SUBCUTANEOUS | Status: DC
  Administered 2017-05-15 – 2017-05-17 (×3): 40 mg via SUBCUTANEOUS
  Filled 2017-05-15 (×3): qty 0.4

## 2017-05-15 MED ORDER — INSULIN ASPART 100 UNIT/ML ~~LOC~~ SOLN
0.0000 [IU] | SUBCUTANEOUS | Status: DC
  Administered 2017-05-15 (×2): 2 [IU] via SUBCUTANEOUS

## 2017-05-15 MED ORDER — INSULIN DETEMIR 100 UNIT/ML ~~LOC~~ SOLN: 25.0000 [IU] | Freq: Two times a day (BID) | SUBCUTANEOUS | Status: DC

## 2017-05-15 NOTE — Op Note (Signed)
NAMECAMBREN, HELM            ACCOUNT NO.:  [id number]  MEDICAL RECORD NO.:  [id number]  LOCATION:                                 FACILITY:  PHYSICIAN:  Salvatore Decent. Dorris Fetch, M.D. DATE OF BIRTH:  DATE OF PROCEDURE:  05/14/2017 DATE OF DISCHARGE:                              OPERATIVE REPORT   PREOPERATIVE DIAGNOSIS:  Left main and 3-vessel coronary artery disease, status post myocardial infarction.  POSTOPERATIVE DIAGNOSIS:  Left main and 3-vessel coronary artery disease, status post myocardial infarction.  PROCEDURES:  Median sternotomy, extracorporeal circulation, Coronary artery bypass grafting x 5  Left internal mammary artery to LAD,  Sequential free right internal mammary artery to obtuse marginals 1 and 2,  Sequential saphenous vein graft to posterior descending and posterolateral Endoscopic vein harvest left leg  SURGEON:  Viviann Spare C. Dorris Fetch, MD.  ASSISTANT:  Rowe Clack, PA-C.  ANESTHESIA:  General.  FINDINGS:  Right saphenous vein appeared very small.  Left saphenous vein slightly better, fair quality.  The long segment was too small to use as a graft, therefore right mammary artery harvested. Both IMAs were good quality.  Posterior descending was diffusely diseased.  Remaining targets were good quality.  Transesophageal echocardiography showed ejection fraction approximately 45% with global hypokinesis. Some scarring on inferior wall.  CLINICAL NOTE:  Mr. Fauver is a 56 year old gentleman with multiple cardiac risk factors, but no prior history of coronary artery disease. He presented with chest pain and ST elevation.  He was sent to [facility] for emergency catheterization.  His pain and ST elevation improved prior to catheterization.  the catheterization was found to have left main and 3- vessel disease.  He was advised to undergo coronary artery bypass grafting.  The indications, risks, benefits, and alternatives were discussed in detail with the  patient.  He understood and accepted the risks and agreed to proceed.  OPERATIVE NOTE:  Mr. Keelin was brought to the preoperative holding area on May 14, 2017.  Anesthesia placed a Swan-Ganz catheter and arterial blood pressure monitoring line.  He was taken to the operating room, anesthetized, and intubated.  Transesophageal echocardiography was performed by Dr. Val Eagle of Anesthesia.  Please see his separately dictated note for full findings.  There was mild mitral regurgitation.  Ejection fraction was approximately 45% with mild global hypokinesis.  There were no significant regional wall motion abnormalities.  The chest, abdomen, and legs were prepped and draped in the usual sterile fashion.    A median sternotomy was performed and the left internal mammary artery was harvested using standard technique. Simultaneously, an incision was made in the medial aspect of the right leg just below the knee.  Two small veins were identified, neither of which appeared suitable for use as a bypass graft.  An incision was made on the medial aspect of the left leg at the level of the knee.  Greater saphenous vein was identified and was harvested endoscopically from mid calf to groin.  2000 units of heparin was administered during the vessel harvest.  After harvesting the vein, it was cannulated.  There was a long segment that was too small to use as a bypass graft.  There was a  middle segment that was suitable for use as a bypass graft, but not sufficient length to graft the obtuse marginals and the right coronary branches. Therefore, the decision was made to harvest the right internal mammary artery.  This was done under direct vision in similar fashion to the left.  Additional heparin was administered while harvesting this vessel, and a full heparin dose was given after the right mammary had been harvested.  The right mammary was ligated proximally for use as a  free graft.  A sternal retractor was placed.  The pericardium was opened.  There was a small pericardial effusion.  There was marked cardiomegaly.  The aorta was of normal caliber.  After confirming adequate anticoagulation with ACT measurement, the aorta was cannulated via concentric 2-0 Ethibond pledgeted pursestring sutures.  A dual-stage venous cannula was placed via a pursestring suture in the right atrial appendage.  Cardiopulmonary bypass was initiated.  Flows were maintained per protocol.  The patient was cooled to 32 degrees Celsius.  The coronary arteries were inspected and anastomotic sites were chosen.  There was some inferior wall scarring noted while examining the coronaries.  This was a relatively small area.  The conduits were inspected and cut to length.  A foam pad was placed in the pericardium to insulate the heart.  A temperature probe was placed in the myocardial septum, and a cardioplegia cannula was placed in the ascending aorta.  The aorta was crossclamped.  The left ventricle was emptied via the aortic root vent.  Cardiac arrest was achieved with a combination of cold antegrade blood cardioplegia and topical iced saline.  One liter of cardioplegia was administered.  There was a rapid diastolic arrest and septal cooling to 11 degrees Celsius.  A reversed saphenous vein graft was placed sequentially to the posterior descending and posterolateral branches of the right coronary artery. The posterior descending was a large vessels, 2-mm in diameter, but did have diffuse plaque throughout.  The posterolateral was slightly smaller, approximately 1.8 mm in diameter.  It was a good quality vessel. A side-to-side anastomosis was performed to the posterior descending and end-to-side to the posterolateral.  Both were done with running 7-0 Prolene sutures.  All anastomoses were probed proximally and distally prior to tying sutures.  Cardioplegia was administered down the  vein graft.  There were good flow and good hemostasis.  Additional cardioplegia was administered down the aortic root as well.  The heart then was elevated exposing the anterolateral wall.  The OM-1 was superficially intramyocardial.  It was a 1.5-mm, good quality target.  OM-2 was a 2-mm, good quality target.  A side-to-side anastomosis was performed from the right mammary to the OM-1 with a running 8-0 Prolene suture and then end-to-side was performed to OM2 again with a running 8-0 Prolene suture.  Additional cardioplegia was administered down the aortic root.  There was good backbleeding from the right mammary artery.  A bulldog clamp was placed across the proximal end.  The left internal mammary artery then was brought through a window in the pericardium.  The distal end was beveled and it was anastomosed end- to-side to the distal LAD.  The LAD was a 1.5-mm, good quality target. The left mammary was a 2-mm, good quality conduit.  The end-to-side anastomosis was performed with a running 8-0 Prolene suture.  At the completion of the anastomosis, the bulldog clamp was removed, rapid septal rewarming was noted, the bulldog clamp was replaced, and the mammary pedicle was tacked to  the epicardial surface of the heart with 6-0 Prolene sutures.  Additional cardioplegia was administered.  The cardioplegia cannula was removed from the ascending aorta.  The vein graft and right mammary were cut to length and beveled.  The proximal anastomoses were then performed to 4.0-mm punch aortotomies with a running 7-0 Prolene suture for the right mammary graft followed by a running 6-0 Prolene suture for the vein graft.  At the completion of the final proximal anastomosis, the patient was placed in Trendelenburg position.  Lidocaine was administered.  The bulldog clamp was removed from the left mammary artery.  The aortic root was de-aired and the aortic crossclamp was removed.  Total crossclamp  time was 84 minutes.  The patient initially was in heart block, but spontaneously resumed sinus rhythm.  While rewarming was completed, all proximal and distal anastomoses were inspected for hemostasis.  Epicardial pacing wires were placed on the right ventricle and right atrium.  When the patient had rewarmed to a core temperature of 37 degrees Celsius, he was weaned from cardiopulmonary bypass on the first attempt.  Total bypass time was 141 minutes.  The patient's pressure was relatively low initially and a dopamine infusion was initiated at 3 mcg/kg/minute.  The initial cardiac index was greater than 2.5 L/min/m2, and the patient remained hemodynamically stable throughout the post-bypass period.  Post-bypass transesophageal echocardiography revealed no significant change from the prebypass study.  A test dose of protamine was administered and it was well tolerated.  The atrial and aortic cannulae were removed.  The remainder of the protamine was administered without incident.  The chest was irrigated with warm saline.  Hemostasis was achieved.  The pericardium was not closed.  Bilateral pleural tubes and a mediastinal tube were placed through separate subcostal incisions. The sternum was closed with a combination of single and double heavy gauge stainless steel wires.  The pectoralis fascia, subcutaneous tissue, and skin were closed in a standard fashion.  All sponge, needle, and instrument counts were correct at the end of the procedure.  The patient was taken from the operating room to the surgical intensive care unit in good condition.     Salvatore Decent Dorris Fetch, M.D.     SCH/MEDQ  D:  05/14/2017  T:  05/15/2017  Job:  [id number]

## 2017-05-15 NOTE — Progress Notes (Signed)
Dr. Donata ClayVan Trigt paged again regarding flattened PAP waveform with no blood return, despite pulling catheter back 2cm (per MD order) and power flushing and changing pt position. MD called back, said to check CVP q1hr for volume status, monitor CO/CI per protocol, and draw a coox panel with AM labs.  Will continue to monitor.

## 2017-05-15 NOTE — Anesthesia Postprocedure Evaluation (Signed)
Anesthesia Post Note  Patient: Parker Sellers  Procedure(s) Performed: Procedure(s) (LRB): CORONARY ARTERY BYPASS GRAFTING (CABG)/EVH times five using bilateral internal mammary arteries and left saphenous vein. (N/A) TRANSESOPHAGEAL ECHOCARDIOGRAM (TEE) (N/A)     Patient location during evaluation: ICU Anesthesia Type: General Level of consciousness: sedated Pain management: pain level controlled Vital Signs Assessment: post-procedure vital signs reviewed and stable Respiratory status: patient remains intubated per anesthesia plan Cardiovascular status: stable Anesthetic complications: no    Last Vitals:  Vitals:   05/15/17 0715 05/15/17 0800  BP:  110/75  Pulse: 88 92  Resp: (!) 22 (!) 28  Temp: 37.6 C 37.5 C  SpO2: 96% 98%    Last Pain:  Vitals:   05/15/17 0745  TempSrc:   PainSc: 7                  Rosibel Giacobbe

## 2017-05-15 NOTE — Progress Notes (Signed)
Patient ID: Parker Sellers, male   DOB: June 04, 1961, 56 y.o.   MRN: [id number] EVENING ROUNDS NOTE :     [address]       [place] [id number]             [phone]                 1 Day Post-Op Procedure(s) (LRB): CORONARY ARTERY BYPASS GRAFTING (CABG)/EVH times five using bilateral internal mammary arteries and left saphenous vein. (N/A) TRANSESOPHAGEAL ECHOCARDIOGRAM (TEE) (N/A)  Total Length of Stay:  LOS: 3 days  BP 112/72   Pulse 93   Temp 99.4 F (37.4 C) (Oral)   Resp (!) 26   Ht 5\' 8"  (1.727 m)   Wt 232 lb 12.9 oz (105.6 kg)   SpO2 99%   BMI 35.40 kg/m   .Intake/Output      09/11 0701 - 09/12 0700   I.V. (mL/kg) 474.5 (4.5)   IV Piggyback 100   Total Intake(mL/kg) 574.5 (5.4)   Urine (mL/kg/hr) 1770 (1.4)   Chest Tube 0   Total Output 1770   Net -1195.5         . sodium chloride Stopped (05/15/17 1200)  . sodium chloride    . sodium chloride Stopped (05/15/17 1200)  . cefUROXime (ZINACEF)  IV Stopped (05/15/17 [id number])  . dexmedetomidine (PRECEDEX) IV infusion Stopped (05/14/17 2140)  . famotidine (PEPCID) IV Stopped (05/15/17 1001)  . insulin (NOVOLIN-R) infusion Stopped (05/15/17 1200)  . lactated ringers    . lactated ringers    . lactated ringers 20 mL/hr at 05/15/17 1848  . nitroGLYCERIN Stopped (05/14/17 1900)  . phenylephrine (NEO-SYNEPHRINE) Adult infusion       Lab Results  Component Value Date   WBC 12.5 (H) 05/15/2017   HGB 13.0 05/15/2017   HCT 39.5 05/15/2017   PLT 147 (L) 05/15/2017   GLUCOSE 113 (H) 05/15/2017   CHOL 193 05/12/2017   TRIG 99 05/12/2017   HDL 29 (L) 05/12/2017   LDLCALC 144 (H) 05/12/2017   ALT 21 05/12/2017   AST 26 05/12/2017   NA 137 05/15/2017   K 3.7 05/15/2017   CL 108 05/15/2017   CREATININE 1.16 05/15/2017   BUN 18 05/15/2017   CO2 24 05/15/2017   TSH 1.770 04/02/2017   INR 1.28 05/14/2017   HGBA1C 5.2 05/12/2017   Stable from cabg yesterday Walked 100 feet today Stable    Delight OvensEdward B Jazmyne Beauchesne MD  Beeper [phone] Office [phone] 05/15/2017 7:24 PM

## 2017-05-15 NOTE — Progress Notes (Signed)
1 Day Post-Op Procedure(s) (LRB): CORONARY ARTERY BYPASS GRAFTING (CABG)/EVH times five using bilateral internal mammary arteries and left saphenous vein. (N/A) TRANSESOPHAGEAL ECHOCARDIOGRAM (TEE) (N/A) Subjective: C/o incisional pain  Objective: Vital signs in last 24 hours: Temp:  [92.7 F (33.7 C)-100.2 F (37.9 C)] 99.5 F (37.5 C) (09/11 0800) Pulse Rate:  [73-94] 92 (09/11 0800) Cardiac Rhythm: Normal sinus rhythm (09/11 0745) Resp:  [10-28] 28 (09/11 0800) BP: (91-129)/(66-91) 110/75 (09/11 0800) SpO2:  [92 %-100 %] 98 % (09/11 0800) Arterial Line BP: (100-155)/(58-88) 135/75 (09/11 0800) FiO2 (%):  [40 %-70 %] 40 % (09/10 2140) Weight:  [232 lb 12.9 oz (105.6 kg)-242 lb 15.2 oz (110.2 kg)] 232 lb 12.9 oz (105.6 kg) (09/11 0500)  Hemodynamic parameters for last 24 hours: PAP: (16-28)/(9-18) 17/13 CVP:  [5 mmHg-11 mmHg] 5 mmHg CO:  [4.8 L/min-8.3 L/min] 7.2 L/min CI:  [2.2 L/min/m2-3.8 L/min/m2] 3.3 L/min/m2  Intake/Output from previous day: 09/10 0701 - 09/11 0700 In: 5417.6 [P.O.:120; I.V.:4167.6; Blood:400; NG/GT:100; IV Piggyback:600] Out: 6020 [Urine:4540; Blood:1000; Chest Tube:480] Intake/Output this shift: Total I/O In: 125.1 [I.V.:125.1] Out: 225 [Urine:225]  General appearance: alert, cooperative and no distress Neurologic: intact Heart: regular rate and rhythm Lungs: diminished breath sounds bibasilar Abdomen: normal findings: soft, non-tender  Lab Results:  Recent Labs  05/14/17 2214 05/14/17 2221 05/15/17 0403  WBC 12.5*  --  12.3*  HGB 13.7 13.3 13.6  HCT 40.2 39.0 40.4  PLT 155  --  162   BMET:  Recent Labs  05/14/17 0521  05/14/17 2221 05/15/17 0403  NA 138  < > 142 137  K 3.8  < > 4.1 3.7  CL 107  < > 106 108  CO2 23  --   --  24  GLUCOSE 114*  < > 125* 113*  BUN 18  < > 22* 18  CREATININE 1.44*  < > 1.10 1.16  CALCIUM 8.8*  --   --  8.1*  < > = values in this interval not displayed.  PT/INR:  Recent Labs  05/14/17 1600   LABPROT 15.9*  INR 1.28   ABG    Component Value Date/Time   PHART 7.405 05/14/2017 2349   HCO3 23.5 05/14/2017 2349   TCO2 25 05/14/2017 2349   ACIDBASEDEF 1.0 05/14/2017 2349   O2SAT 74.4 05/15/2017 0415   CBG (last 3)   Recent Labs  05/15/17 0413 05/15/17 0508 05/15/17 0617  GLUCAP 115* 112* 111*    Assessment/Plan: S/P Procedure(s) (LRB): CORONARY ARTERY BYPASS GRAFTING (CABG)/EVH times five using bilateral internal mammary arteries and left saphenous vein. (N/A) TRANSESOPHAGEAL ECHOCARDIOGRAM (TEE) (N/A) -  CV- good hemodynamics- dc swan, A line  ASA, statin, beta blocker, resume ACE-I prior to dc  RESP- IS for bibasilar atelectasis  RENAL- creatinine and lytes OK  Diurese  ENDO- transition off insulin drip to levemir + SSI  Dc chest tubes  Mobilize  SCD + enoxaparin for DVT prophylaxis   LOS: 3 days    Loreli SlotSteven C Pasha Gadison 05/15/2017

## 2017-05-15 NOTE — Progress Notes (Signed)
Pt wearing CPAP for the night using his machine from home.  Pt has 3lpm oxygen in use with CPAP.  Sats 96% and pt tolerating well.

## 2017-05-15 NOTE — Progress Notes (Signed)
EKG CRITICAL VALUE     12 lead EKG performed.  Critical value noted. Kennith Centerachel Tarwater, RN notified.   Jaryah Aracena, CCT 05/15/2017 7:01 AM

## 2017-05-16 ENCOUNTER — Inpatient Hospital Stay (HOSPITAL_COMMUNITY): Payer: BLUE CROSS/BLUE SHIELD

## 2017-05-16 LAB — CBC
HCT: 36.9 % — ABNORMAL LOW (ref 39.0–52.0)
Hemoglobin: 12.4 g/dL — ABNORMAL LOW (ref 13.0–17.0)
MCH: 30 pg (ref 26.0–34.0)
MCHC: 33.6 g/dL (ref 30.0–36.0)
MCV: 89.3 fL (ref 78.0–100.0)
Platelets: 152 10*3/uL (ref 150–400)
RBC: 4.13 MIL/uL — ABNORMAL LOW (ref 4.22–5.81)
RDW: 14.3 % (ref 11.5–15.5)
WBC: 12.6 10*3/uL — ABNORMAL HIGH (ref 4.0–10.5)

## 2017-05-16 LAB — BASIC METABOLIC PANEL
Anion gap: 4 — ABNORMAL LOW (ref 5–15)
BUN: 25 mg/dL — ABNORMAL HIGH (ref 6–20)
CO2: 25 mmol/L (ref 22–32)
Calcium: 7.9 mg/dL — ABNORMAL LOW (ref 8.9–10.3)
Chloride: 104 mmol/L (ref 101–111)
Creatinine, Ser: 1.36 mg/dL — ABNORMAL HIGH (ref 0.61–1.24)
GFR calc Af Amer: >60 mL/min (ref >60)
GFR calc non Af Amer: 56 mL/min — ABNORMAL LOW (ref >60)
Glucose, Bld: 110 mg/dL — ABNORMAL HIGH (ref 65–99)
Potassium: 3.9 mmol/L (ref 3.5–5.1)
Sodium: 133 mmol/L — ABNORMAL LOW (ref 135–145)

## 2017-05-16 LAB — GLUCOSE, CAPILLARY
Glucose-Capillary: 106 mg/dL — ABNORMAL HIGH (ref 65–99)
Glucose-Capillary: 102 mg/dL — ABNORMAL HIGH (ref 65–99)
Glucose-Capillary: 98 mg/dL (ref 65–99)
Glucose-Capillary: 91 mg/dL (ref 65–99)
Glucose-Capillary: 105 mg/dL — ABNORMAL HIGH (ref 65–99)

## 2017-05-16 MED ORDER — MOVING RIGHT ALONG BOOK
Freq: Once | Status: AC
  Administered 2017-05-16: 09:00:00
  Filled 2017-05-16: qty 1

## 2017-05-16 MED ORDER — FUROSEMIDE 40 MG PO TABS
40.0000 mg | ORAL_TABLET | Freq: Every day | ORAL | Status: DC
  Administered 2017-05-16 – 2017-05-18 (×3): 40 mg via ORAL
  Filled 2017-05-16 (×3): qty 1

## 2017-05-16 MED ORDER — ALUM & MAG HYDROXIDE-SIMETH 200-200-20 MG/5ML PO SUSP: 15.0000 mL | Freq: Four times a day (QID) | ORAL | Status: DC | PRN

## 2017-05-16 MED ORDER — MAGNESIUM HYDROXIDE 400 MG/5ML PO SUSP: 30.0000 mL | Freq: Every day | ORAL | Status: DC | PRN

## 2017-05-16 MED ORDER — SODIUM CHLORIDE 0.9% FLUSH: 3.0000 mL | INTRAVENOUS | Status: DC | PRN

## 2017-05-16 MED ORDER — SODIUM CHLORIDE 0.9% FLUSH
3.0000 mL | Freq: Two times a day (BID) | INTRAVENOUS | Status: DC
  Administered 2017-05-16 – 2017-05-17 (×4): 3 mL via INTRAVENOUS

## 2017-05-16 MED ORDER — METOPROLOL TARTRATE 25 MG PO TABS
25.0000 mg | ORAL_TABLET | Freq: Two times a day (BID) | ORAL | Status: DC
  Administered 2017-05-16: 25 mg via ORAL

## 2017-05-16 MED ORDER — SODIUM CHLORIDE 0.9 % IV SOLN: 250.0000 mL | INTRAVENOUS | Status: DC | PRN

## 2017-05-16 MED ORDER — POTASSIUM CHLORIDE CRYS ER 20 MEQ PO TBCR
20.0000 meq | EXTENDED_RELEASE_TABLET | Freq: Two times a day (BID) | ORAL | Status: DC
  Administered 2017-05-16 – 2017-05-18 (×5): 20 meq via ORAL
  Filled 2017-05-16 (×5): qty 1

## 2017-05-16 MED ORDER — METOPROLOL TARTRATE 25 MG/10 ML ORAL SUSPENSION
25.0000 mg | Freq: Two times a day (BID) | ORAL | Status: DC
  Administered 2017-05-16: 25 mg
  Filled 2017-05-16: qty 10

## 2017-05-16 MED ORDER — ZOLPIDEM TARTRATE 5 MG PO TABS: 5.0000 mg | ORAL_TABLET | Freq: Every evening | ORAL | Status: DC | PRN

## 2017-05-16 MED ORDER — INSULIN ASPART 100 UNIT/ML ~~LOC~~ SOLN: 0.0000 [IU] | Freq: Three times a day (TID) | SUBCUTANEOUS | Status: DC

## 2017-05-16 MED FILL — Sodium Bicarbonate IV Soln 8.4%: INTRAVENOUS | Qty: 50 | Status: AC

## 2017-05-16 MED FILL — Heparin Sodium (Porcine) Inj 1000 Unit/ML: INTRAMUSCULAR | Qty: 20 | Status: AC

## 2017-05-16 MED FILL — Electrolyte-R (PH 7.4) Solution: INTRAVENOUS | Qty: 3000 | Status: AC

## 2017-05-16 MED FILL — Mannitol IV Soln 20%: INTRAVENOUS | Qty: 500 | Status: AC

## 2017-05-16 MED FILL — Sodium Chloride IV Soln 0.9%: INTRAVENOUS | Qty: 2000 | Status: AC

## 2017-05-16 MED FILL — Lidocaine HCl IV Inj 20 MG/ML: INTRAVENOUS | Qty: 5 | Status: AC

## 2017-05-16 NOTE — Care Management Note (Signed)
Case Management Note  Patient Details  Name: Parker Sellers MRN: [id number] Date of Birth: 1961-04-11  Subjective/Objective:        Pt is s/p CABG            Action/Plan:   PTA independent from home - wife will be with pt 24/7 at discharge.  CM will continue to follow for discharge needs   Expected Discharge Date:                  Expected Discharge Plan:  Home/Self Care  In-House Referral:     Discharge planning Services  CM Consult  Post Acute Care Choice:    Choice offered to:     DME Arranged:    DME Agency:     HH Arranged:    HH Agency:     Status of Service:  In process, will continue to follow  If discussed at Long Length of Stay Meetings, dates discussed:    Additional Comments:  Cherylann ParrClaxton, Darchelle Nunes S, RN 05/16/2017, 10:25 AM

## 2017-05-16 NOTE — Plan of Care (Signed)
Report called to 4E RN 

## 2017-05-16 NOTE — Progress Notes (Signed)
1355 Came to see pt to walk. Pt stated he has walked twice already and would prefer to rest now. He will walk with staff later. Will follow up tomorrow. Luetta NuttingCharlene Sergio Hobart RN BSN 05/16/2017 1:54 PM

## 2017-05-16 NOTE — Progress Notes (Signed)
Transferred to 4E 20 via monitor and WC.RN in room to receive. Pt placed in bed.

## 2017-05-16 NOTE — Plan of Care (Signed)
Attempted to call report to 4E. 

## 2017-05-16 NOTE — Progress Notes (Signed)
2 Days Post-Op Procedure(s) (LRB): CORONARY ARTERY BYPASS GRAFTING (CABG)/EVH times five using bilateral internal mammary arteries and left saphenous vein. (N/A) TRANSESOPHAGEAL ECHOCARDIOGRAM (TEE) (N/A) Subjective: Pain much better with tubes out  Objective: Vital signs in last 24 hours: Temp:  [98.4 F (36.9 C)-99.5 F (37.5 C)] 98.8 F (37.1 C) (09/12 0400) Pulse Rate:  [83-105] 92 (09/12 0700) Cardiac Rhythm: Normal sinus rhythm (09/11 2000) Resp:  [16-32] 24 (09/12 0700) BP: (105-146)/(69-92) 146/86 (09/12 0700) SpO2:  [91 %-100 %] 93 % (09/12 0700) Arterial Line BP: (135-147)/(75-91) 135/83 (09/11 1100) Weight:  [232 lb 9.4 oz (105.5 kg)] 232 lb 9.4 oz (105.5 kg) (09/12 0500)  Hemodynamic parameters for last 24 hours:    Intake/Output from previous day: 09/11 0701 - 09/12 0700 In: 901.5 [P.O.:30; I.V.:721.5; IV Piggyback:150] Out: 2250 [Urine:2250] Intake/Output this shift: No intake/output data recorded.  General appearance: alert, cooperative and no distress Neurologic: intact Heart: regular rate and rhythm Lungs: diminished breath sounds bibasilar Abdomen: normal findings: soft, non-tender  Lab Results:  Recent Labs  05/15/17 1802 05/16/17 0533  WBC 12.5* 12.6*  HGB 13.0 12.4*  HCT 39.5 36.9*  PLT 147* 152   BMET:  Recent Labs  05/15/17 0403 05/15/17 1802 05/16/17 0533  NA 137  --  133*  K 3.7  --  3.9  CL 108  --  104  CO2 24  --  25  GLUCOSE 113*  --  110*  BUN 18  --  25*  CREATININE 1.16 1.49* 1.36*  CALCIUM 8.1*  --  7.9*    PT/INR:  Recent Labs  05/14/17 1600  LABPROT 15.9*  INR 1.28   ABG    Component Value Date/Time   PHART 7.405 05/14/2017 2349   HCO3 23.5 05/14/2017 2349   TCO2 25 05/14/2017 2349   ACIDBASEDEF 1.0 05/14/2017 2349   O2SAT 74.4 05/15/2017 0415   CBG (last 3)   Recent Labs  05/15/17 2018 05/15/17 2346 05/16/17 0316  GLUCAP 140* 103* 106*    Assessment/Plan: S/P Procedure(s) (LRB): CORONARY  ARTERY BYPASS GRAFTING (CABG)/EVH times five using bilateral internal mammary arteries and left saphenous vein. (N/A) TRANSESOPHAGEAL ECHOCARDIOGRAM (TEE) (N/A) -Doing well  CV- in SR  Hypertension- increase metoprolol  Hold lisinopril for now with creatinine mildly elevated  RESP- continue IS for atelectasis  RENAL- creatinine minimally elevated- follow  PO lasix and potassium  ENDO- CBG well controlled- change to AC/HS, dc levemir  SCD + enoxaparin for DVT prophylaxis  Transfer to 4E   LOS: 4 days    Loreli SlotSteven C Mikiah Demond 05/16/2017

## 2017-05-17 ENCOUNTER — Inpatient Hospital Stay (HOSPITAL_COMMUNITY): Payer: BLUE CROSS/BLUE SHIELD

## 2017-05-17 LAB — BASIC METABOLIC PANEL
Anion gap: 5 (ref 5–15)
BUN: 25 mg/dL — ABNORMAL HIGH (ref 6–20)
CO2: 28 mmol/L (ref 22–32)
Calcium: 8.2 mg/dL — ABNORMAL LOW (ref 8.9–10.3)
Chloride: 100 mmol/L — ABNORMAL LOW (ref 101–111)
Creatinine, Ser: 1.27 mg/dL — ABNORMAL HIGH (ref 0.61–1.24)
GFR calc Af Amer: >60 mL/min (ref >60)
GFR calc non Af Amer: >60 mL/min (ref >60)
Glucose, Bld: 109 mg/dL — ABNORMAL HIGH (ref 65–99)
Potassium: 4.3 mmol/L (ref 3.5–5.1)
Sodium: 133 mmol/L — ABNORMAL LOW (ref 135–145)

## 2017-05-17 LAB — CBC
HCT: 37 % — ABNORMAL LOW (ref 39.0–52.0)
Hemoglobin: 12.2 g/dL — ABNORMAL LOW (ref 13.0–17.0)
MCH: 29.5 pg (ref 26.0–34.0)
MCHC: 33 g/dL (ref 30.0–36.0)
MCV: 89.4 fL (ref 78.0–100.0)
Platelets: 176 10*3/uL (ref 150–400)
RBC: 4.14 MIL/uL — ABNORMAL LOW (ref 4.22–5.81)
RDW: 14.3 % (ref 11.5–15.5)
WBC: 10.6 10*3/uL — ABNORMAL HIGH (ref 4.0–10.5)

## 2017-05-17 LAB — GLUCOSE, CAPILLARY: Glucose-Capillary: 96 mg/dL (ref 65–99)

## 2017-05-17 MED ORDER — METOPROLOL TARTRATE 12.5 MG HALF TABLET
37.5000 mg | ORAL_TABLET | Freq: Two times a day (BID) | ORAL | Status: DC
  Administered 2017-05-17 – 2017-05-18 (×3): 37.5 mg via ORAL
  Filled 2017-05-17 (×3): qty 1

## 2017-05-17 MED ORDER — LISINOPRIL-HYDROCHLOROTHIAZIDE 20-12.5 MG PO TABS: 2.0000 | ORAL_TABLET | Freq: Every day | ORAL | Status: DC

## 2017-05-17 MED ORDER — HYDROCHLOROTHIAZIDE 25 MG PO TABS
25.0000 mg | ORAL_TABLET | Freq: Every day | ORAL | Status: DC
  Administered 2017-05-17 – 2017-05-18 (×2): 25 mg via ORAL
  Filled 2017-05-17 (×2): qty 1

## 2017-05-17 MED ORDER — LISINOPRIL 40 MG PO TABS
40.0000 mg | ORAL_TABLET | Freq: Every day | ORAL | Status: DC
  Administered 2017-05-17 – 2017-05-18 (×2): 40 mg via ORAL
  Filled 2017-05-17 (×2): qty 1

## 2017-05-17 NOTE — Progress Notes (Signed)
Pt has home cpap and self administers. Pt stated that he was fine and RT educated to call if pt needed anything.

## 2017-05-17 NOTE — Discharge Summary (Signed)
Physician Discharge Summary  Patient ID: Parker Sellers MRN: [id number] DOB/AGE: 05-22-1961 56 y.o.  Admit date: 05/12/2017 Discharge date: 05/18/2017  Admission Diagnoses: Patient Active Problem List   Diagnosis Date Noted  . ST elevation myocardial infarction (STEMI) of lateral wall, initial episode of care (HCC) 05/12/2017  . Hyperlipidemia 05/12/2017  . Hypertensive emergency 05/12/2017  . Tobacco use 05/12/2017  . Hypertensive emergency without congestive heart failure 05/12/2017  . Hypertension 03/23/2017  . Obesity (BMI 30-39.9) 03/23/2017    Discharge Diagnoses:  Principal Problem:   ST elevation myocardial infarction (STEMI) of lateral wall, initial episode of care [facility](HCC) Active Problems:   Hypertension   Obesity (BMI 30-39.9)   Hyperlipidemia   Hypertensive emergency   Tobacco use   Hypertensive emergency without congestive heart failure   S/P CABG x 5   Discharged Condition: good  HPI:  Parker Sellers is a 56 yo man with a past history of hypertension, hyperlipidemia, obesity, tobacco abuse and a family history of CAD, but no prior cardiac history. He was playing cards about 10 O'clock last night when he note onset of substernal chest pain. He initially thought it was indigestion and went to lay down. It eased off temporarily but recurred soon after now with radiation to his jaw. + diaphoresis. He went to [facility] ED and was noted to have ST elevation. He was [facility] cath. His pain and ST elevation improved prior to the cath. At cath he was found to have left main and 3 vessel CAD. Currently pain free.  Hospital Course:  Parker Sellers underwent a CABG x 5 on 9/10 with Dr. Dorris FetchHendrickson. He tolerated the procedure well and was transferred to the ICU. He was extubated in a timely manner.  POD 1 he remained  Hemodynamically stable and in normal sinus rhythm. His a-line and swan ganz catheter was discontinued. Lovenox was initiated for DVT proph. POD 2  he continued to progress. He began to ambulate around the unit. His metoprolol was increased for improved heart rate and blood pressure control. He was transferred to the telemetry unit for continued care. POD 3 his epicardial pacing wires were removed. His metoprolol was increased and an ACEI was initiated since his kidney function improved. POD 4 he was tolerating room air, his incisions were healing well, he was ambulating the halls with limited assistance, and he is ready for discharge home.   Consults: None  Significant Diagnostic Studies:  CLINICAL DATA:  CABG.  EXAM: CHEST  2 VIEW  COMPARISON:  05/16/2017 .  FINDINGS: Interval removal of right IJ sheath. Prior CABG. Cardiomegaly with normal pulmonary vascularity. Mild bibasilar atelectasis. Small left pleural effusion. No pneumothorax.  IMPRESSION: 1. Interim removal of right IJ sheath.  2.  Prior CABG.  Stable cardiomegaly.  3. Bibasilar subsegmental atelectasis.   Electronically Signed   By: Maisie Fushomas  Register   On: 05/17/2017 07:33  Treatments: NAME:  Lara MulchSTENNATTE, Merel            ACCOUNT NO.:  [id number]  MEDICAL RECORD NO.:  [id number]  LOCATION:                                 FACILITY:  PHYSICIAN:  Salvatore DecentSteven C. Dorris FetchHendrickson, M.D. DATE OF BIRTH:  DATE OF PROCEDURE:  05/14/2017 DATE OF DISCHARGE:  OPERATIVE REPORT   PREOPERATIVE DIAGNOSIS:  Left main and 3-vessel coronary artery disease, status post myocardial infarction.  POSTOPERATIVE DIAGNOSIS:  Left main and 3-vessel coronary artery disease, status post myocardial infarction.  PROCEDURES:  Median sternotomy, extracorporeal circulation, coronary artery bypass grafting x5 (left internal mammary artery to left LAD, sequential free right internal mammary artery to obtuse marginals 1 and 2, sequential saphenous vein graft to posterior descending and posterior lateral).  SURGEON:  Salvatore Decent. Dorris Fetch,  MD.  ASSISTANT:  Rowe Clack, PA-C.  ANESTHESIA:  General.  Discharge Exam: Blood pressure (!) 144/88, pulse 95, temperature 98.5 F (36.9 C), temperature source Oral, resp. rate 18, height  (1.727 m), weight 103.1 kg (227 lb 4.7 oz), SpO2 96 %.   General appearance: alert, cooperative and no distress Neurologic: intact Heart: regular rate and rhythm Lungs: clear to auscultation bilaterally Wound: clean and dry, sternum stable  Disposition: Final discharge disposition not confirmed  Discharge Instructions    Amb Referral to Cardiac Rehabilitation    Complete by:  As directed    Diagnosis:   STEMI CABG     CABG X ___:  5   Discharge patient    Complete by:  As directed    Discharge disposition:  01-Home or Self Care   Discharge patient date:  05/18/2017     Allergies as of 05/18/2017   No Known Allergies     Medication List    STOP taking these medications   amLODipine 5 MG tablet Commonly known as:  NORVASC     TAKE these medications   aspirin 325 MG EC tablet Take 1 tablet (325 mg total) by mouth daily.   atorvastatin 80 MG tablet Commonly known as:  LIPITOR Take 1 tablet (80 mg total) by mouth daily at 6 PM. What changed:  medication strength  how much to take  when to take this   lisinopril-hydrochlorothiazide 20-12.5 MG tablet Commonly known as:  ZESTORETIC Take 2 tablets by mouth daily.   Metoprolol Tartrate 37.5 MG Tabs Take 37.5 mg by mouth 2 (two) times daily.   multivitamin with minerals Tabs tablet Take 1 tablet by mouth daily.   oxyCODONE 5 MG immediate release tablet Commonly known as:  Oxy IR/ROXICODONE Take 1 tablet (5 mg total) by mouth every 4 (four) hours as needed for severe pain.   Vitamin D (Ergocalciferol) 50000 units Caps capsule Commonly known as:  DRISDOL Take 1 capsule (50,000 Units total) by mouth every 7 (seven) days.            Discharge Care Instructions        Start     Ordered   05/18/17 0000   aspirin 325 MG EC tablet  Daily     05/18/17 0802   05/18/17 0000  atorvastatin (LIPITOR) 80 MG tablet  Daily-1800    Question:  Supervising Provider  Answer:  Loreli Slot   05/18/17 0802   05/18/17 0000  Metoprolol Tartrate 37.5 MG TABS  2 times daily    Question:  Supervising Provider  Answer:  Charlett Lango C   05/18/17 0802   05/18/17 0000  oxyCODONE (OXY IR/ROXICODONE) 5 MG immediate release tablet  Every 4 hours PRN    Question:  Supervising Provider  Answer:  Loreli Slot   05/18/17 0802   05/18/17 0000  Discharge patient    Question Answer Comment  Discharge disposition 01-Home or Self Care   Discharge patient date 05/18/2017  05/18/17 0802   05/17/17 0000  Amb Referral to Cardiac Rehabilitation    Question Answer Comment  Diagnosis: STEMI   Diagnosis: CABG   CABG X ___ 5      05/17/17 1610     Follow-up Information    Loreli Slot, MD Follow up.   Specialty:  Cardiothoracic Surgery Why:  Your appointment is on 06/19/2017 at 11:45am. Please arrive at 11:15am for a chest xray at [facility] which is on the first floor of our building.  Contact information: [address] [place] [address] [phone]        Junie Spencer, FNP. Call in 1 day(s).   Specialty:  Family Medicine Contact information: [address] [place] [address] [phone]        Marykay Lex, MD Follow up.   Specialty:  Cardiology Why:  The office will call. Contact information: [address] [place] [address] [phone]          The patient has been discharged on:   1.Beta Blocker:  Yes [ x  ]                              No   [   ]                              If No, reason:  2.Ace Inhibitor/ARB: Yes [ x  ]                                     No  [    ]                                     If No, reason:  3.Statin:   Yes [ x  ]                  No  [   ]                  If  No, reason:  4.Ecasa:  Yes  [ x  ]                  No   [   ]                  If No, reason:    Signed: Sharlene Dory 05/18/2017, 8:02 AM

## 2017-05-17 NOTE — Discharge Instructions (Signed)

## 2017-05-17 NOTE — Progress Notes (Addendum)
      [address]       [place] [id number]             [phone]      3 Days Post-Op Procedure(s) (LRB): CORONARY ARTERY BYPASS GRAFTING (CABG)/EVH times five using bilateral internal mammary arteries and left saphenous vein. (N/A) TRANSESOPHAGEAL ECHOCARDIOGRAM (TEE) (N/A) Subjective: No issues. Feels good today.   Objective: Vital signs in last 24 hours: Temp:  [97.5 F (36.4 C)-98.9 F (37.2 C)] 97.7 F (36.5 C) (09/13 0455) Pulse Rate:  [83-104] 83 (09/13 0455) Cardiac Rhythm: Normal sinus rhythm (09/13 0700) Resp:  [16-28] 21 (09/13 0455) BP: (123-151)/(77-96) 144/95 (09/13 0455) SpO2:  [90 %-96 %] 93 % (09/13 0455) Weight:  [105.5 kg (232 lb 8 oz)] 105.5 kg (232 lb 8 oz) (09/13 0619)     Intake/Output from previous day: 09/12 0701 - 09/13 0700 In: 500 [P.O.:480; I.V.:20] Out: 840 [Urine:840] Intake/Output this shift: No intake/output data recorded.  General appearance: alert, cooperative and no distress Heart: sinus tachycardia Lungs: clear to auscultation bilaterally Abdomen: soft, non-tender; bowel sounds normal; no masses,  no organomegaly Extremities: extremities normal, atraumatic, no cyanosis or edema Wound: clean and dry  Lab Results:  Recent Labs  05/16/17 0533 05/17/17 0226  WBC 12.6* 10.6*  HGB 12.4* 12.2*  HCT 36.9* 37.0*  PLT 152 176   BMET:  Recent Labs  05/16/17 0533 05/17/17 0226  NA 133* 133*  K 3.9 4.3  CL 104 100*  CO2 25 28  GLUCOSE 110* 109*  BUN 25* 25*  CREATININE 1.36* 1.27*  CALCIUM 7.9* 8.2*    PT/INR:  Recent Labs  05/14/17 1600  LABPROT 15.9*  INR 1.28   ABG    Component Value Date/Time   PHART 7.405 05/14/2017 2349   HCO3 23.5 05/14/2017 2349   TCO2 25 05/14/2017 2349   ACIDBASEDEF 1.0 05/14/2017 2349   O2SAT 74.4 05/15/2017 0415   CBG (last 3)   Recent Labs  05/16/17 1718 05/16/17 2117 05/17/17 0632  GLUCAP 91 105* 96    Assessment/Plan: S/P Procedure(s)  (LRB): CORONARY ARTERY BYPASS GRAFTING (CABG)/EVH times five using bilateral internal mammary arteries and left saphenous vein. (N/A) TRANSESOPHAGEAL ECHOCARDIOGRAM (TEE) (N/A)  1. CV-Maintaining NSR in the 100s. BP well controlled. Increase Metoprolol.  Holding lisinopril for now due to creatinine.  2. Pulm-tolerating room air. Xray showed bibasilar atelectasis. No pneumothorax, small left pleural effusion. 3. Renal-creatinine trending down, 1.27 today. Electrolytes okay. Weight stable, making good urine. Continue Lasix 40mg  daily.  4. Endo-blood glucose well controlled.  5. H and H stable, platelets trending up.   Plan: Discontinue EPW. Ambulate in the halls. Increase Metoprolol. Work on Facilities managerincentive spirometer.    LOS: 5 days    Sharlene Doryessa N Conte 05/17/2017 Patient seen and examined, agree with above BP still elevated, creatinine improved- will resume lisinopril/ HCTZ Possibly home in AM if continues to progress  Viviann SpareSteven C. Dorris FetchHendrickson, MD [facility] [phone]]

## 2017-05-17 NOTE — Progress Notes (Signed)
Removed epicardial wires per order. 4 intact.  Pt tolerated procedure well.  Pt instructed to remain on bedrest for one hour.  Frequent vitals will be taken and documented. Pt resting with call bell within reach. ° °

## 2017-05-17 NOTE — Progress Notes (Signed)
CARDIAC REHAB PHASE I   PRE:  Rate/Rhythm: 97 SR  BP:  Supine:   Sitting:  Standing: 159/96   SaO2: 98%RA  MODE:  Ambulation: 412 ft   POST:  Rate/Rhythm: 102 ST  BP:  Supine:   Sitting: 154/92  Standing:    SaO2: 97%RA [id number] Pt walked 412 ft on RA rolling walker and minimal asst. Tolerated well, stopping a few times to rest. Pt's wife stated she could borrow a rolling walker from family. Told her if he needs one to let us know and one can be gotten. Education completed with pt and wife who voiced understanding. Encouraged IS, sternal precautions, and tobacco cessation. Pt chews/dips. Gave smoking cessation handout. Wife smokes but going to do so outside. Discussed ex ed and CRP 2. Referring to [place] Phase 2. Wrote down how to view discharge video. Referred heart healthy diet.   Luetta Nuttingharlene Adasia Hoar, RN BSN  05/17/2017 9:30 AM

## 2017-05-18 MED ORDER — ATORVASTATIN CALCIUM 80 MG PO TABS: 80.0000 mg | ORAL_TABLET | Freq: Every day | ORAL | 1 refills | Status: DC

## 2017-05-18 MED ORDER — METOPROLOL TARTRATE 37.5 MG PO TABS
37.5000 mg | ORAL_TABLET | Freq: Two times a day (BID) | ORAL | 1 refills | Status: DC
Start: 2017-05-18 — End: 2017-07-18

## 2017-05-18 MED ORDER — ASPIRIN 325 MG PO TBEC: 325.0000 mg | DELAYED_RELEASE_TABLET | Freq: Every day | ORAL | 0 refills | Status: DC

## 2017-05-18 MED ORDER — OXYCODONE HCL 5 MG PO TABS: 5.0000 mg | ORAL_TABLET | ORAL | 0 refills | Status: DC | PRN

## 2017-05-18 NOTE — Progress Notes (Signed)
4 Days Post-Op Procedure(s) (LRB): CORONARY ARTERY BYPASS GRAFTING (CABG)/EVH times five using bilateral internal mammary arteries and left saphenous vein. (N/A) TRANSESOPHAGEAL ECHOCARDIOGRAM (TEE) (N/A) Subjective: No complaints  Objective: Vital signs in last 24 hours: Temp:  [98.2 F (36.8 C)-98.5 F (36.9 C)] 98.5 F (36.9 C) (09/14 0604) Pulse Rate:  [84-100] 95 (09/13 1511) Cardiac Rhythm: Normal sinus rhythm (09/14 0700) Resp:  [18-32] 18 (09/13 1511) BP: (124-154)/(70-111) 144/88 (09/13 2002) SpO2:  [94 %-96 %] 96 % (09/13 1511) Weight:  [227 lb 4.7 oz (103.1 kg)] 227 lb 4.7 oz (103.1 kg) (09/14 0603)  Hemodynamic parameters for last 24 hours:    Intake/Output from previous day: 09/13 0701 - 09/14 0700 In: 480 [P.O.:480] Out: 1600 [Urine:1600] Intake/Output this shift: No intake/output data recorded.  General appearance: alert, cooperative and no distress Neurologic: intact Heart: regular rate and rhythm Lungs: clear to auscultation bilaterally Wound: clean and dry, sternum stable  Lab Results:  Recent Labs  05/16/17 0533 05/17/17 0226  WBC 12.6* 10.6*  HGB 12.4* 12.2*  HCT 36.9* 37.0*  PLT 152 176   BMET:  Recent Labs  05/16/17 0533 05/17/17 0226  NA 133* 133*  K 3.9 4.3  CL 104 100*  CO2 25 28  GLUCOSE 110* 109*  BUN 25* 25*  CREATININE 1.36* 1.27*  CALCIUM 7.9* 8.2*    PT/INR: No results for input(s): LABPROT, INR in the last 72 hours. ABG    Component Value Date/Time   PHART 7.405 05/14/2017 2349   HCO3 23.5 05/14/2017 2349   TCO2 25 05/14/2017 2349   ACIDBASEDEF 1.0 05/14/2017 2349   O2SAT 74.4 05/15/2017 0415   CBG (last 3)   Recent Labs  05/16/17 1718 05/16/17 2117 05/17/17 0632  GLUCAP 91 105* 96    Assessment/Plan: S/P Procedure(s) (LRB): CORONARY ARTERY BYPASS GRAFTING (CABG)/EVH times five using bilateral internal mammary arteries and left saphenous vein. (N/A) TRANSESOPHAGEAL ECHOCARDIOGRAM (TEE) (N/A) Plan for  discharge: see discharge orders  POD # 4 Doing well Will dc home today   LOS: 6 days    Loreli Slot 05/18/2017

## 2017-05-18 NOTE — Care Management Note (Signed)
Case Management Note Original Note Created  Cherylann Parr, RN 05/16/2017, 10:25 AM  Patient Details  Name: Parker Sellers MRN: [id number] Date of Birth: 09-28-1960  Subjective/Objective:        Pt is s/p CABG            Action/Plan:   PTA independent from home - wife will be with pt 24/7 at discharge.  CM will continue to follow for discharge needs   Expected Discharge Date:  05/18/17               Expected Discharge Plan:  Home/Self Care  In-House Referral:  NA  Discharge planning Services  CM Consult  Post Acute Care Choice:  NA Choice offered to:  NA  DME Arranged:    DME Agency:     HH Arranged:    HH Agency:     Status of Service:  Completed, signed off  If discussed at [employer] of Stay Meetings, dates discussed:    Discharge Disposition: home/self care   Additional Comments:  05/18/17- 1020- [name] RN, CM- pt for d/c home today- no CM needs noted for discharge.   [name], RN 05/18/2017, 10:22 AM [phone]

## 2017-05-22 ENCOUNTER — Telehealth: Payer: Self-pay | Admitting: Cardiology

## 2017-05-22 ENCOUNTER — Ambulatory Visit (INDEPENDENT_AMBULATORY_CARE_PROVIDER_SITE_OTHER): Payer: BLUE CROSS/BLUE SHIELD | Admitting: Family

## 2017-05-22 ENCOUNTER — Encounter: Payer: Self-pay | Admitting: Family

## 2017-05-22 VITALS — BP 120/83 | HR 81 | Temp 98.2°F | Ht 68.0 in | Wt 225.4 lb

## 2017-05-22 DIAGNOSIS — I2510 Atherosclerotic heart disease of native coronary artery without angina pectoris: Secondary | ICD-10-CM | POA: Insufficient documentation

## 2017-05-22 DIAGNOSIS — Z09 Encounter for follow-up examination after completed treatment for conditions other than malignant neoplasm: Secondary | ICD-10-CM | POA: Diagnosis not present

## 2017-05-22 DIAGNOSIS — Z951 Presence of aortocoronary bypass graft: Secondary | ICD-10-CM | POA: Diagnosis not present

## 2017-05-22 DIAGNOSIS — I25110 Atherosclerotic heart disease of native coronary artery with unstable angina pectoris: Secondary | ICD-10-CM

## 2017-05-22 DIAGNOSIS — I10 Essential (primary) hypertension: Secondary | ICD-10-CM | POA: Diagnosis not present

## 2017-05-22 DIAGNOSIS — E784 Other hyperlipidemia: Secondary | ICD-10-CM

## 2017-05-22 DIAGNOSIS — I2129 ST elevation (STEMI) myocardial infarction involving other sites: Secondary | ICD-10-CM | POA: Diagnosis not present

## 2017-05-22 DIAGNOSIS — E7849 Other hyperlipidemia: Secondary | ICD-10-CM

## 2017-05-22 DIAGNOSIS — I25119 Atherosclerotic heart disease of native coronary artery with unspecified angina pectoris: Secondary | ICD-10-CM | POA: Diagnosis not present

## 2017-05-22 DIAGNOSIS — E669 Obesity, unspecified: Secondary | ICD-10-CM

## 2017-05-22 HISTORY — DX: Atherosclerotic heart disease of native coronary artery with unstable angina pectoris: I25.110

## 2017-05-22 NOTE — Patient Instructions (Signed)
Acute Coronary Syndrome °Acute coronary syndrome (ACS) is a serious problem in which there is suddenly not enough blood and oxygen supplied to the heart. ACS may mean that one or more of the blood vessels in your heart (coronary arteries) may be blocked. ACS can result in chest pain or a heart attack (myocardial infarction or MI). °What are the causes? °This condition is caused by atherosclerosis, which is the buildup of fat and cholesterol (plaque) on the inside of the arteries. Over time, the plaque may narrow or block the artery, and this will lessen blood flow to the heart. Plaque can also become weak and break off within a coronary artery to form a clot and cause a sudden blockage. °What increases the risk? °The risk factors of this condition include: °· High cholesterol levels. °· High blood pressure (hypertension). °· Smoking. °· Diabetes. °· Age. °· Family history of chest pain, heart disease, or stroke. °· Lack of exercise. °What are the signs or symptoms? °The most common signs of this condition include: °· Chest pain, which can be: °¨ A crushing or squeezing in the chest. °¨ A tightness, pressure, fullness, or heaviness in the chest. °¨ Present for more than a few minutes, or it can stop and recur. °· Pain in the arms, neck, jaw, or back. °· Unexplained heartburn or indigestion. °· Shortness of breath. °· Nausea. °· Sudden cold sweats. °· Feeling light-headed or dizzy. °Sometimes, this condition has no symptoms. °How is this diagnosed? °ACS may be diagnosed through the following tests: °· Electrocardiogram (ECG). °· Blood tests. °· Coronary angiogram. This is a procedure to look at the coronary arteries to see if there is any blockage. °How is this treated? °Treatment for ACS may include: °· Healthy behavioral changes to reduce or control risk factors. °· Medicine. °· Coronary stenting. A stent helps to keep an artery open. °· Coronary angioplasty. This procedure widens a narrowed or blocked  artery. °· Coronary artery bypass surgery. This will allow your blood to pass the blockage (bypass) to reach your heart. °Follow these instructions at home: °Eating and drinking °· Follow a heart-healthy diet. A dietitian can you help to educate you about healthy food options and changes. °· Use healthy cooking methods such as roasting, grilling, broiling, baking, poaching, steaming, or stir-frying. Talk to a dietitian to learn more about healthy cooking methods. °Medicines °· Take medicines only as directed by your health care provider. °· Do not take the following medicines unless your health care provider approves: °¨ Nonsteroidal anti-inflammatory drugs (NSAIDs), such as ibuprofen, naproxen, or celecoxib. °¨ Vitamin supplements that contain vitamin A, vitamin E, or both. °¨ Hormone replacement therapy that contains estrogen with or without progestin. °· Stop illegal drug use. °Activity °· Follow an exercise program that is approved by your health care provider. °· Plan rest periods when you are fatigued. °Lifestyle °· Do not use any tobacco products, including cigarettes, chewing tobacco, or electronic cigarettes. If you need help quitting, ask your health care provider. °· If you drink alcohol, and your health care provider approves, limit your alcohol intake to no more than 1 drink per day. One drink equals 12 ounces of beer, 5 ounces of wine, or 1½ ounces of hard liquor. °· Learn to manage stress. °· Maintain a healthy weight. Lose weight as approved by your health care provider. °General instructions °· Manage other health conditions, such as hypertension and diabetes, as directed by your health care provider. °· Keep all follow-up visits as directed by your   health care provider. This is important. °· Your health care provider may ask you to monitor your blood pressure. A blood pressure reading consists of a higher number over a lower number, such as 110 over 72, written as 110/72. Ideally, your blood  pressure should be: °¨ Below 140/90 if you have no other medical conditions. °¨ Below 130/80 if you have diabetes or kidney disease. °Get help right away if: °· You have pain in your chest, neck, arm, jaw, stomach, or back that lasts more than a few minutes, is recurring, or is not relieved by taking medicine under your tongue (sublingual nitroglycerin). °· You have profuse sweating without cause. °· You have unexplained: °¨ Heartburn or indigestion. °¨ Shortness of breath or difficulty breathing. °¨ Nausea or vomiting. °¨ Fatigue. °¨ Feelings of nervousness or anxiety. °¨ Weakness. °¨ Diarrhea. °· You have sudden light-headedness or dizziness. °· You faint. °These symptoms may represent a serious problem that is an emergency. Do not wait to see if the symptoms will go away. Get medical help right away. Call your local emergency services (911 in the [place] Do not drive yourself to the clinic or hospital.  °This information is not intended to replace advice given to you by your health care provider. Make sure you discuss any questions you have with your health care provider. °Document Released: 08/21/2005 Document Revised: 02/02/2016 Document Reviewed: 12/23/2013 °Elsevier Interactive Patient Education © 2017 [employer]vier Inc. ° °

## 2017-05-22 NOTE — Progress Notes (Signed)
   Subjective:    Patient ID: Parker Sellers, male    DOB: [birth year], 56 y.o.   MRN: [id number]  HPI Pt presents to the office today for hospital follow up. Pt went to the ED on 05/12/17 for chest pain and was admitted for ST elevation. Pt was transferred to [facility] where he had a CABG X 5 on 05/14/17.   Pt has follow up appt with Cardiothoracic Surgery on 06/19/17 and follow up appt with Cardiologists that will be set up.   Pt denies any chest pain, SOB, or edema. PT states he has intermittent pain of 1 out 10 and the pain is worse when he coughs.   [facility] notes reviewed   Review of Systems  All other systems reviewed and are negative.      Objective:   Physical Exam  Constitutional: He is oriented to person, place, and time. He appears well-developed and well-nourished. No distress.  HENT:  Head: Normocephalic.  Right Ear: External ear normal.  Left Ear: External ear normal.  Nose: Nose normal.  Mouth/Throat: Oropharynx is clear and moist.  Eyes: Pupils are equal, round, and reactive to light. Right eye exhibits no discharge. Left eye exhibits no discharge.  Neck: Normal range of motion. Neck supple. No thyromegaly present.  Cardiovascular: Normal rate, regular rhythm, normal heart sounds and intact distal pulses.   No murmur heard. Pulmonary/Chest: Effort normal and breath sounds normal. No respiratory distress. He has no wheezes.  Abdominal: Soft. Bowel sounds are normal. He exhibits no distension. There is no tenderness.  Musculoskeletal: He exhibits no edema or tenderness.  Mild pain in chest rotation  Neurological: He is alert and oriented to person, place, and time.  Skin: Skin is warm and dry. No rash noted. No erythema.  Psychiatric: He has a normal mood and affect. His behavior is normal. Judgment and thought content normal.  Vitals reviewed.     BP 120/83   Pulse 81   Temp 98.2 F (36.8 C) (Oral)   Ht _0  (1.727 m)   Wt 225 lb 6.4 oz (102.2 kg)    BMI 34.27 kg/m      Assessment & Plan:  1. Hypertension, unspecified type - CMP14+EGFR - CBC with Differential/Platelet  2. ST elevation myocardial infarction (STEMI) of lateral wall, initial episode of care ([place])  - CMP14+EGFR - CBC with Differential/Platelet  3. S/P CABG x 5 - CMP14+EGFR - CBC with Differential/Platelet  4. Other hyperlipidemia - CMP14+EGFR - CBC with Differential/Platelet  5. Obesity (BMI 30-39.9)  - CMP14+EGFR - CBC with Differential/Platelet  6. Hospital discharge follow-up - CMP14+EGFR - CBC with Differential/Platelet  7. Coronary artery disease involving native heart with angina pectoris, unspecified vessel or lesion type ([place] - CMP14+EGFR - CBC with Differential/Platelet  Continue all medications Low fat diet Keep all follow up appts with specialists Labs pending Keep chronic follow up and RTO prn    Evelina Dun, FNP

## 2017-05-22 NOTE — Telephone Encounter (Signed)
Closed encounter °

## 2017-05-23 LAB — CMP14+EGFR
ALT: 92 IU/L — ABNORMAL HIGH (ref 0–44)
AST: 46 IU/L — ABNORMAL HIGH (ref 0–40)
Albumin/Globulin Ratio: 1 — ABNORMAL LOW (ref 1.2–2.2)
Albumin: 3.8 g/dL (ref 3.5–5.5)
Alkaline Phosphatase: 98 IU/L (ref 39–117)
BUN/Creatinine Ratio: 15 (ref 9–20)
BUN: 22 mg/dL (ref 6–24)
Bilirubin Total: 0.5 mg/dL (ref 0.0–1.2)
CO2: 23 mmol/L (ref 20–29)
Calcium: 9.5 mg/dL (ref 8.7–10.2)
Chloride: 98 mmol/L (ref 96–106)
Creatinine, Ser: 1.48 mg/dL — ABNORMAL HIGH (ref 0.76–1.27)
GFR calc Af Amer: 60 mL/min/{1.73_m2} (ref >59)
GFR calc non Af Amer: 52 mL/min/{1.73_m2} — ABNORMAL LOW (ref >59)
Globulin, Total: 3.7 g/dL (ref 1.5–4.5)
Glucose: 111 mg/dL — ABNORMAL HIGH (ref 65–99)
Potassium: 5.2 mmol/L (ref 3.5–5.2)
Sodium: 138 mmol/L (ref 134–144)
Total Protein: 7.5 g/dL (ref 6.0–8.5)

## 2017-05-23 LAB — CBC WITH DIFFERENTIAL/PLATELET
Basophils Absolute: 0.1 10*3/uL (ref 0.0–0.2)
Basos: 1 %
EOS (ABSOLUTE): 0.7 10*3/uL — ABNORMAL HIGH (ref 0.0–0.4)
Eos: 6 %
Hematocrit: 42.8 % (ref 37.5–51.0)
Hemoglobin: 14.1 g/dL (ref 13.0–17.7)
Immature Grans (Abs): 0.1 10*3/uL (ref 0.0–0.1)
Immature Granulocytes: 1 %
Lymphocytes Absolute: 1.9 10*3/uL (ref 0.7–3.1)
Lymphs: 16 %
MCH: 29.7 pg (ref 26.6–33.0)
MCHC: 32.9 g/dL (ref 31.5–35.7)
MCV: 90 fL (ref 79–97)
Monocytes Absolute: 1.1 10*3/uL — ABNORMAL HIGH (ref 0.1–0.9)
Monocytes: 9 %
Neutrophils Absolute: 8.3 10*3/uL — ABNORMAL HIGH (ref 1.4–7.0)
Neutrophils: 67 %
Platelets: 466 10*3/uL — ABNORMAL HIGH (ref 150–379)
RBC: 4.74 x10E6/uL (ref 4.14–5.80)
RDW: 14.9 % (ref 12.3–15.4)
WBC: 12.2 10*3/uL — ABNORMAL HIGH (ref 3.4–10.8)

## 2017-05-24 ENCOUNTER — Other Ambulatory Visit: Payer: Self-pay | Admitting: Family

## 2017-05-30 DIAGNOSIS — Z0289 Encounter for other administrative examinations: Secondary | ICD-10-CM

## 2017-06-05 LAB — ECHO TEE
AO mean calculated velocity dopler: 72.9 cm/s
AV Area VTI index: 0.99 cm2/m2
AV Area VTI: 2.64 cm2
AV Area mean vel: 2.38 cm2
AV Mean grad: 2 mmHg
AV Peak grad: 4 mmHg
AV VEL mean LVOT/AV: 0.76
AV area mean vel ind: 1.1 cm2/m2
AV peak Index: 1.22
AV pk vel: 106 cm/s
AV vel: 2.13
Ao pk vel: 0.84 m/s
LVOT MV VTI INDEX: 0.72 cm2/m2
LVOT MV VTI: 1.55
LVOT SV: 44 mL
LVOT VTI: 13.9 cm
LVOT area: 3.14 cm2
LVOT diameter: 20 mm
LVOT peak VTI: 0.68 cm
LVOT peak vel: 89.1 cm/s
MV Annulus VTI: 28.1 cm
MV M vel: 33
Mean grad: 1 mmHg
VTI: 20.5 cm
Valve area index: 0.99
Valve area: 2.13 cm2

## 2017-06-06 ENCOUNTER — Emergency Department (HOSPITAL_COMMUNITY)
Admission: EM | Admit: 2017-06-06 | Discharge: 2017-06-06 | Disposition: A | Discharge status: Home / Self Care | Payer: BLUE CROSS/BLUE SHIELD | Attending: Emergency Medicine | Admitting: Emergency Medicine

## 2017-06-06 ENCOUNTER — Encounter (HOSPITAL_COMMUNITY): Payer: Self-pay | Admitting: Emergency Medicine

## 2017-06-06 DIAGNOSIS — R531 Weakness: Secondary | ICD-10-CM | POA: Diagnosis not present

## 2017-06-06 DIAGNOSIS — R42 Dizziness and giddiness: Secondary | ICD-10-CM | POA: Diagnosis present

## 2017-06-06 DIAGNOSIS — R110 Nausea: Secondary | ICD-10-CM | POA: Diagnosis not present

## 2017-06-06 DIAGNOSIS — Z79899 Other long term (current) drug therapy: Secondary | ICD-10-CM | POA: Insufficient documentation

## 2017-06-06 DIAGNOSIS — Z951 Presence of aortocoronary bypass graft: Secondary | ICD-10-CM | POA: Diagnosis not present

## 2017-06-06 DIAGNOSIS — I249 Acute ischemic heart disease, unspecified: Secondary | ICD-10-CM | POA: Diagnosis not present

## 2017-06-06 DIAGNOSIS — Z7982 Long term (current) use of aspirin: Secondary | ICD-10-CM | POA: Diagnosis not present

## 2017-06-06 DIAGNOSIS — F17220 Nicotine dependence, chewing tobacco, uncomplicated: Secondary | ICD-10-CM | POA: Diagnosis not present

## 2017-06-06 DIAGNOSIS — I10 Essential (primary) hypertension: Secondary | ICD-10-CM | POA: Diagnosis not present

## 2017-06-06 DIAGNOSIS — R61 Generalized hyperhidrosis: Secondary | ICD-10-CM | POA: Diagnosis not present

## 2017-06-06 LAB — I-STAT CHEM 8, ED
BUN: 22 mg/dL — ABNORMAL HIGH (ref 6–20)
Calcium, Ion: 1.19 mmol/L (ref 1.15–1.40)
Chloride: 98 mmol/L — ABNORMAL LOW (ref 101–111)
Creatinine, Ser: 1.2 mg/dL (ref 0.61–1.24)
Glucose, Bld: 142 mg/dL — ABNORMAL HIGH (ref 65–99)
HCT: 41 % (ref 39.0–52.0)
Hemoglobin: 13.9 g/dL (ref 13.0–17.0)
Potassium: 4.2 mmol/L (ref 3.5–5.1)
Sodium: 136 mmol/L (ref 135–145)
TCO2: 28 mmol/L (ref 22–32)

## 2017-06-06 LAB — CBC WITH DIFFERENTIAL/PLATELET
Basophils Absolute: 0 10*3/uL (ref 0.0–0.1)
Basophils Relative: 0 %
Eosinophils Absolute: 1.5 10*3/uL — ABNORMAL HIGH (ref 0.0–0.7)
Eosinophils Relative: 10 %
HCT: 40.2 % (ref 39.0–52.0)
Hemoglobin: 13.8 g/dL (ref 13.0–17.0)
Lymphocytes Relative: 8 %
Lymphs Abs: 1.2 10*3/uL (ref 0.7–4.0)
MCH: 30.5 pg (ref 26.0–34.0)
MCHC: 34.3 g/dL (ref 30.0–36.0)
MCV: 88.7 fL (ref 78.0–100.0)
Monocytes Absolute: 0.8 10*3/uL (ref 0.1–1.0)
Monocytes Relative: 6 %
Neutro Abs: 11.1 10*3/uL — ABNORMAL HIGH (ref 1.7–7.7)
Neutrophils Relative %: 76 %
Platelets: 296 10*3/uL (ref 150–400)
RBC: 4.53 MIL/uL (ref 4.22–5.81)
RDW: 13.5 % (ref 11.5–15.5)
WBC: 14.7 10*3/uL — ABNORMAL HIGH (ref 4.0–10.5)

## 2017-06-06 LAB — I-STAT TROPONIN, ED
Troponin i, poc: 0.02 ng/mL (ref 0.00–0.08)
Troponin i, poc: 0.02 ng/mL (ref 0.00–0.08)

## 2017-06-06 NOTE — ED Notes (Signed)
Patient ambulated around nursing station. Patient states that he is not having any pain at this time. Also states that he is not having any shortness of breath or dizziness. States that he is a little tired from not getting any rest yesterday.

## 2017-06-06 NOTE — ED Provider Notes (Signed)
[facility]EM[facility]Y DEPT Provider Note   CSN: [id number] Arrival date & time: 06/06/17  1647     History   Chief Complaint Chief Complaint  Patient presents with  . Dizziness    HPI OVADIA LOPP is a 56 y.o. male.  HPI Complains of dizziness meaning lightheadedness onset 2 hours prior to coming here lasted approximately 20 minutes associated symptoms include nausea and sweatiness. No shortness of breath no chest pain no nausea. Symptoms resolved spontaneously without treatment. No other associated symptoms. He feels much improved presently without treatment. Past Medical History:  Diagnosis Date  . Hyperlipidemia   . Hypertension   . Sleep apnea    home CPAP  . Tobacco use     Patient Active Problem List   Diagnosis Date Noted  . CAD (coronary artery disease) 05/22/2017  . S/P CABG x 5 05/14/2017  . ST elevation myocardial infarction (STEMI) of lateral wall, initial episode of care (HCC) 05/12/2017  . Hyperlipidemia 05/12/2017  . Hypertensive emergency 05/12/2017  . Tobacco use 05/12/2017  . Hypertensive emergency without congestive heart failure 05/12/2017  . Hypertension 03/23/2017  . Obesity (BMI 30-39.9) 03/23/2017    Past Surgical History:  Procedure Laterality Date  . CORONARY ARTERY BYPASS GRAFT N/A 05/14/2017   Procedure: CORONARY ARTERY BYPASS GRAFTING (CABG)/EVH times five using bilateral internal mammary arteries and left saphenous vein.;  Surgeon: Loreli Slot, MD;  Location: [facility] OR;  Service: Open Heart Surgery;  Laterality: N/A;  . HERNIA REPAIR    . KNEE SURGERY Left    Torn meniscus  . LEFT HEART CATH AND CORONARY ANGIOGRAPHY N/A 05/12/2017   Procedure: LEFT HEART CATH AND CORONARY ANGIOGRAPHY;  Surgeon: Marykay Lex, MD;  Location: [facility] INVASIVE CV LAB;  Service: Cardiovascular;  Laterality: N/A;  . TEE WITHOUT CARDIOVERSION N/A 05/14/2017   Procedure: TRANSESOPHAGEAL ECHOCARDIOGRAM (TEE);  Surgeon: Loreli Slot, MD;  Location: [facility]  OR;  Service: Open Heart Surgery;  Laterality: N/A;       Home Medications    Prior to Admission medications   Medication Sig Start Date End Date Taking? Authorizing Provider  aspirin 325 MG EC tablet Take 1 tablet (325 mg total) by mouth daily. 05/18/17   Sharlene Dory, PA-C  atorvastatin (LIPITOR) 80 MG tablet Take 1 tablet (80 mg total) by mouth daily at 6 PM. 05/18/17   Sharlene Dory, PA-C  lisinopril-hydrochlorothiazide (ZESTORETIC) 20-12.5 MG tablet Take 2 tablets by mouth daily. 03/26/17   Jannifer Rodney A, FNP  Metoprolol Tartrate 37.5 MG TABS Take 37.5 mg by mouth 2 (two) times daily. 05/18/17   Sharlene Dory, PA-C  Multiple Vitamin (MULTIVITAMIN WITH MINERALS) TABS tablet Take 1 tablet by mouth daily.    [provider]  oxyCODONE (OXY IR/ROXICODONE) 5 MG immediate release tablet Take 1 tablet (5 mg total) by mouth every 4 (four) hours as needed for severe pain. 05/18/17   Sharlene Dory, PA-C  Vitamin D, Ergocalciferol, (DRISDOL) 50000 units CAPS capsule Take 1 capsule (50,000 Units total) by mouth every 7 (seven) days. 04/03/17   Junie Spencer, FNP    Family History Family History  Problem Relation Age of Onset  . Cancer Mother   . Hypertension Father   . Liver disease Father   . Alcohol abuse Father   . AAA (abdominal aortic aneurysm) Father   . Coronary artery disease Brother        recent stent placement    Social History Social History  Substance  Use Topics  . Smoking status: Former Games developer  . Smokeless tobacco: Current User    Types: Snuff, Chew  . Alcohol use 1.2 oz/week    2 Cans of beer per week     Comment: quit as of sept 2018     Allergies   Patient has no known allergies.   Review of Systems Review of Systems  Constitutional: Positive for diaphoresis.  HENT: Negative.   Respiratory: Negative.   Cardiovascular: Negative.   Gastrointestinal: Positive for nausea.  Musculoskeletal: Negative.   Skin: Negative.   Neurological: Positive  for light-headedness.  Psychiatric/Behavioral: Negative.   All other systems reviewed and are negative.    Physical Exam Updated Vital Signs BP 123/72   Pulse 87   Temp 97.8 F (36.6 C) (Oral)   Resp (!) 22   Ht  (1.727 m)   Wt 102.1 kg (225 lb)   SpO2 96%   BMI 34.21 kg/m   Physical Exam  Constitutional: He appears well-developed and well-nourished.  HENT:  Head: Normocephalic and atraumatic.  Eyes: Pupils are equal, round, and reactive to light. Conjunctivae are normal.  Neck: Neck supple. No tracheal deviation present. No thyromegaly present.  Cardiovascular: Normal rate and regular rhythm.   No murmur heard. Pulmonary/Chest: Effort normal and breath sounds normal.  Abdominal: Soft. Bowel sounds are normal. He exhibits no distension. There is no tenderness.  Musculoskeletal: Normal range of motion. He exhibits no edema or tenderness.  Right calf with greenish yellow ecchymosis. Nontender. No neurovascular intact. Lower extremities or contusion abrasion or tenderness neurovascularly intact  Neurological: He is alert. Coordination normal.  Skin: Skin is warm and dry. No rash noted.  Psychiatric: He has a normal mood and affect.  Nursing note and vitals reviewed.    ED Treatments / Results  Labs (all labs ordered are listed, but only abnormal results are displayed) Labs Reviewed  CBC WITH DIFFERENTIAL/PLATELET  I-STAT TROPONIN, ED  I-STAT CHEM 8, ED    EKG  EKG Interpretation  Date/Time:  Wednesday June 06 2017 16:51:15 EDT Ventricular Rate:  83 PR Interval:    QRS Duration: 111 QT Interval:  343 QTC Calculation: 403 R Axis:   72 Text Interpretation:  Sinus rhythm Probable left atrial enlargement Nonspecific T abnormalities, diffuse leads Confirmed by Doug Sou [phone]) on 06/06/2017 4:57:04 PM      Results for orders placed or performed during the hospital encounter of 06/06/17  CBC with Differential/Platelet  Result Value Ref Range   WBC  14.7 (H) 4.0 - 10.5 K/uL   RBC 4.53 4.22 - 5.81 MIL/uL   Hemoglobin 13.8 13.0 - 17.0 g/dL   HCT 21.3 08.6 - 57.8 %   MCV 88.7 78.0 - 100.0 fL   MCH 30.5 26.0 - 34.0 pg   MCHC 34.3 30.0 - 36.0 g/dL   RDW 46.9 62.9 - 52.8 %   Platelets 296 150 - 400 K/uL   Neutrophils Relative % 76 %   Neutro Abs 11.1 (H) 1.7 - 7.7 K/uL   Lymphocytes Relative 8 %   Lymphs Abs 1.2 0.7 - 4.0 K/uL   Monocytes Relative 6 %   Monocytes Absolute 0.8 0.1 - 1.0 K/uL   Eosinophils Relative 10 %   Eosinophils Absolute 1.5 (H) 0.0 - 0.7 K/uL   Basophils Relative 0 %   Basophils Absolute 0.0 0.0 - 0.1 K/uL  I-stat troponin, ED  Result Value Ref Range   Troponin i, poc 0.02 0.00 - 0.08 ng/mL  Comment 3          I-stat chem 8, ed  Result Value Ref Range   Sodium 136 135 - 145 mmol/L   Potassium 4.2 3.5 - 5.1 mmol/L   Chloride 98 (L) 101 - 111 mmol/L   BUN 22 (H) 6 - 20 mg/dL   Creatinine, Ser 1.61 0.61 - 1.24 mg/dL   Glucose, Bld 096 (H) 65 - 99 mg/dL   Calcium, Ion 0.45 4.09 - 1.40 mmol/L   TCO2 28 22 - 32 mmol/L   Hemoglobin 13.9 13.0 - 17.0 g/dL   HCT 81.1 91.4 - 78.2 %  I-stat troponin, ED  Result Value Ref Range   Troponin i, poc 0.02 0.00 - 0.08 ng/mL   Comment 3           Dg Chest 2 View  Result Date: 05/17/2017 CLINICAL DATA:  CABG. EXAM: CHEST  2 VIEW COMPARISON:  05/16/2017 . FINDINGS: Interval removal of right IJ sheath. Prior CABG. Cardiomegaly with normal pulmonary vascularity. Mild bibasilar atelectasis. Small left pleural effusion. No pneumothorax. IMPRESSION: 1. Interim removal of right IJ sheath. 2.  Prior CABG.  Stable cardiomegaly. 3. Bibasilar subsegmental atelectasis. Electronically Signed   By: Maisie Fus  Register   On: 05/17/2017 07:33   Dg Chest Port 1 View  Result Date: 05/16/2017 CLINICAL DATA:  Chest pain following coronary bypass grafting EXAM: PORTABLE CHEST 1 VIEW COMPARISON:  05/15/2017 FINDINGS: Right jugular sheath is again identified kinked at the skin surface. Swan-Ganz  catheter, mediastinal drain and left thoracostomy tube have been removed in the interval. No pneumothorax is seen. Mild bibasilar atelectatic changes are again seen. No bony abnormality is noted. IMPRESSION: Interval removal of tubes and lines as described. Minimal basilar atelectasis is noted. Electronically Signed   By: Alcide Clever M.D.   On: 05/16/2017 07:43   Dg Chest Port 1 View  Result Date: 05/15/2017 CLINICAL DATA:  Chest tubes. EXAM: PORTABLE CHEST 1 VIEW COMPARISON:  No recent prior. FINDINGS: Interim extubation and removal of NG tube. Bilateral chest tubes, mediastinal drainage catheter, Swan-Ganz catheter in stable position. Progressive bibasilar atelectasis. Right medial base infiltrate cannot be excluded . IMPRESSION: 1. Interim removal of endotracheal tube and NG tube. Remaining lines and tubes including bilateral chest tubes in stable position. No pneumothorax. 2. Progressive bibasilar atelectasis. Medial right base infiltrate cannot be excluded . Electronically Signed   By: Maisie Fus  Register   On: 05/15/2017 07:54   Dg Chest Port 1 View  Result Date: 05/14/2017 CLINICAL DATA:  Status post CABG EXAM: PORTABLE CHEST 1 VIEW COMPARISON:  Chest radiograph from one day prior. FINDINGS: Right internal jugular Swan-Ganz catheter terminates over the right pulmonary artery. Endotracheal tube tip is 2.7 cm above the carina. Enteric tube enters stomach with the tip not seen on this image. Bilateral chest tubes and right mediastinal drain are in place. Intact sternotomy wires. Stable cardiomediastinal silhouette with top-normal heart size. No pneumothorax. No pleural effusion. No pulmonary edema. Mild-to-moderate left and mild right basilar atelectasis. IMPRESSION: 1. No pneumothorax . 2. Well-positioned support structures as detailed. 3. Mild to moderate left and mild right basilar atelectasis. Electronically Signed   By: Delbert Phenix M.D.   On: 05/14/2017 16:20   Dg Chest Port 1 View  Result Date:  05/13/2017 CLINICAL DATA:  Coronary artery disease.  Preop for CABG tomorrow. EXAM: PORTABLE CHEST 1 VIEW COMPARISON:  None. FINDINGS: Heart at the upper limits normal in size, likely normal for portable technique. The lungs are clear.  Pulmonary vasculature is normal. No consolidation, pleural effusion, or pneumothorax. No acute osseous abnormalities are seen. IMPRESSION: No acute abnormality.  Heart size upper normal. Electronically Signed   By: Rubye Oaks M.D.   On: 05/13/2017 16:48    Radiology No results found.  Procedures Procedures (including critical care time)  Medications Ordered in ED Medications - No data to display   Initial Impression / Assessment and Plan / ED Course  I have reviewed the triage vital signs and the nursing notes.  Pertinent labs & imaging results that were available during my care of the patient were reviewed by me and considered in my medical decision making (see chart for details).     7:15 PM patient resting comfortably. Asymptomatic.  9:10 PM patient is alert ambulate around the emergency department and remained asymptomatic. Patient with subtle EKG changes over 05/15/2017 however 2 negative troponins no chest pain. He has close follow-up with his cardiologist Dr. Herbie Baltimore tomorrow. Doubt acute coronary syndrome. Final Clinical Impressions(s) / ED Diagnoses  Diagnosis transient weakness Final diagnoses:  None    New Prescriptions New Prescriptions   No medications on file     Doug Sou, MD 06/06/17 2127

## 2017-06-06 NOTE — ED Triage Notes (Addendum)
Pt brought in EMS after sudden onset dizziness and sweating. CBG en route 111. Pt reports episode lasted approximately 20 minutes. Pt denies dizziness and nausea at this time. Pt denies pain, shortness of breath, numbness, tingling,visual changes.pt alert and oriented. Airway patent. Facial symmetry noted. No drift in extremities.   Pt had CABG x5 in Sept 2018. Drain sutures still noted to epigastric region.

## 2017-06-06 NOTE — Discharge Instructions (Signed)
Keep your scheduled appointment tomorrow with Dr. Herbie Baltimore. Return if your condition worsens or if concern for any reason

## 2017-06-07 ENCOUNTER — Encounter: Payer: Self-pay | Admitting: Cardiology

## 2017-06-07 ENCOUNTER — Ambulatory Visit (INDEPENDENT_AMBULATORY_CARE_PROVIDER_SITE_OTHER): Payer: BLUE CROSS/BLUE SHIELD | Admitting: Cardiology

## 2017-06-07 VITALS — BP 146/80 | HR 80 | Ht 68.0 in | Wt 224.0 lb

## 2017-06-07 DIAGNOSIS — I10 Essential (primary) hypertension: Secondary | ICD-10-CM | POA: Diagnosis not present

## 2017-06-07 DIAGNOSIS — Z720 Tobacco use: Secondary | ICD-10-CM | POA: Diagnosis not present

## 2017-06-07 DIAGNOSIS — E785 Hyperlipidemia, unspecified: Secondary | ICD-10-CM

## 2017-06-07 DIAGNOSIS — Z951 Presence of aortocoronary bypass graft: Secondary | ICD-10-CM

## 2017-06-07 DIAGNOSIS — I2129 ST elevation (STEMI) myocardial infarction involving other sites: Secondary | ICD-10-CM | POA: Diagnosis not present

## 2017-06-07 DIAGNOSIS — E669 Obesity, unspecified: Secondary | ICD-10-CM | POA: Diagnosis not present

## 2017-06-07 DIAGNOSIS — I255 Ischemic cardiomyopathy: Secondary | ICD-10-CM

## 2017-06-07 DIAGNOSIS — I25110 Atherosclerotic heart disease of native coronary artery with unstable angina pectoris: Secondary | ICD-10-CM

## 2017-06-07 NOTE — Patient Instructions (Addendum)
LABS IN 3-4 MONTHS  WILL MAIL YOU LABSLIP  DO NOT EAT OR DRINK THE MORNING OF THE TEST   RECOMMENDATION IF YOU HAVE AN EPISODE LIKE YESTERDAY-  SUGGEST YOU NOT TAKE LISINOPRIL THAT DAY   OR IF YOU HAVE TAKEN IT , DO NOT TAKE THE NEXT DAY. DRINK AT LEAST 2 BOTTLE OF WATER AND ELEVATE YOU LEGS ABOVE YOUR HEART.   Your physician recommends that you schedule a follow-up appointment in 3- 4 MONTHS WITH DR HARDING.

## 2017-06-07 NOTE — Progress Notes (Signed)
PCP: Junie Spencer, FNP  Clinic Note: Chief Complaint  Patient presents with  . Hospitalization Follow-up    Post CABG.  . Code STEMI    Aborted STEMI with ulcerated lesion in the RCA -> stabilized and sent for CABG  . Coronary Artery Disease    HPI: KIYON FIDALGO is a 56 y.o. male with a PMH below who presents today for Post MI-CABG follow-up.  JERRALD DOVERSPIKE was recently seen in the Hospital for STE-ACS - aborted STEMI, with MV CAD noted on urgent Cardiac Cath --> referred for CABG.  Recent Hospitalizations:   05/12/2017 admitted as a STEMI, but the ST segments improved prior to cath. He Is found to have left main disease and three-vessel disease and was referred for CABG. He underwent CABG 5 on 910 with Dr. Dorris Fetch.  06/06/2017 ER visit for weakness, lightheadedness and dizziness.  Appeared to have orthostatic hypotension.    Studies Personally Reviewed - (if available, images/films reviewed: From Epic Chart or Care Everywhere)  LEFT HEART CATH AND CORONARY ANGIOGRAPHY: Presented as ~ STEMI 05/12/2017   Conclusion   There is mild to moderate left ventricular systolic dysfunction. The left ventricular ejection fraction is 35-45% by visual estimate.  LV end diastolic pressure is severely elevated. 22-26 mmHg  Ost LM lesion, 55 %stenosed.  Dist LAD lesion, 100 %stenosed. Apical LAD appears to be subtotally occluded  Ost 1st Diag to 1st Diag lesion, 100 %stenosed. Appears to be chronic  1st Mrg lesion, 70 %stenosed. Moderate caliber vessel  Mid RCA lesion, 75 %stenosed. Ulcerated irregular apple core appearing lesion; Dist RCA lesion, 55 %stenosed.   Ost RPDA lesion, 60 %stenosed. RPDA lesion, 60 %stenosed.  Post Atrio-1 lesion (just after PDA takeoff), 70 %stenosed. Post Atrio-2 lesion (just prior to RPL 3), 95 %stenosed.  Very difficult radial access case due to tortuosity of the innominate artery making it very difficult to engage coronaries. Also  very difficult to cross the aortic valve.   The patient has at least moderate to severe not severe multivessel disease involving moderate to severe ostial left main disease and moderate severe disease in the RCA is a large sleeping dominant vessel.    Wall Motion       Diagnostic Diagram      Basal-mid inferior &  lateral hypokinesis. ~EF 35-45%    TTE 05/13/2017: Mod LVH. Mildly reduced LVED ~45-50%. Akinesis of inferolateral & inferoseptal walls. Gr 2 DD. Mild Ascending Aortic dilation ~3.8 cm.  OR TEE 05/14/2017: Mod Concentric LVH. EF ~35-40%.  Inferior hypokinesis & inferolateral hypokinesis. Dilated MV & TV annuli with only trace MR/TR.  Mild RV dilation with moderately reduced function.  Interval History: Brianna presents here today for his first post hospital follow-up with either cardiology or CT surgery. Besides the episode that he had to go to the ER on October 3, he been doing pretty well. A CPAP and is as he felt quite weak and lethargic, and lightheaded. He was associated with nausea and sweating. This started when he first was lying in bed and got worse when he sat up to go to the bathroom. He went to the emergency room because he simply felt bad and his wife would not around to help him out. He felt better after some IV hydration in the ER. His symptoms were thought to be related to orthostatic hypotension and he was discharged home.  Otherwise since his discharge he has been doing quite well. He is trying to walk  about a mile or so a day. He only notes occasional chest twinging from his postop soreness and has been taking Tylenol maybe once. He indicates he probably would not want to do cardiac rehabilitation, because he is artery started to walk quite a bit. He was actually asking when he can increase his level activity.  He denies any PND, orthopnea and mild end of day edema. His sternal wound to be healing quite well. He denies any rapid irregular heartbeats or palpitations. No  lightheadedness, dizziness or syncope/near syncope. No TIA or amaurosis fugax symptoms.  No claudication.  ROS: A comprehensive was performed. Pertinent symptoms noted above otherwise: Review of Systems  Constitutional: Negative for malaise/fatigue and weight loss.  HENT: Negative for congestion and nosebleeds.   Respiratory: Positive for cough (Still has some mild morning cough). Negative for shortness of breath and wheezing.   Gastrointestinal: Positive for abdominal pain and heartburn. Negative for constipation.  Musculoskeletal: Negative for joint pain.  Neurological: Positive for dizziness (episode noted in history of present illness). Negative for focal weakness.  Endo/Heme/Allergies: Does not bruise/bleed easily.  Psychiatric/Behavioral: Negative for memory loss. The patient is not nervous/anxious and does not have insomnia.   All other systems reviewed and are negative.  I have reviewed and (if needed) personally updated the patient's problem list, medications, allergies, past medical and surgical history, social and family history.   Past Medical History:  Diagnosis Date  . Coronary artery disease involving native coronary artery of native heart with unstable angina pectoris (HCC) 05/22/2017   Cath 05/12/2017 (aborted inferior STEMI) - Ost LM 55%, d (apical) LAD 100%, D1 100% (chronic), Om1 70%, mRCA 75% ulcerated lesion (culprit) - dRCA 55%, ostrPDA 60% & rPDA 60%. RPAV 70%, PAV2 95%. Difficult Radial Access - tortuous innominate artery.  . Hyperlipidemia   . Hypertension   . Ischemic cardiomyopathy 05/12/2017   EF by LV Gram 35-45%; Post MI Echo 45%, OR TEE 35-45%.  Gr 2 DD.  . S/P CABG x 5 05/14/2017   LIMA-LAD, seqSVG-OM1-OM2, free r-Rad-rPDA  . Sleep apnea    home CPAP  . ST elevation myocardial infarction (STEMI) of lateral wall, initial episode of care (HCC) 05/12/2017  . Tobacco use 05/12/2017    Past Surgical History:  Procedure Laterality Date  . CORONARY ARTERY BYPASS  GRAFT N/A 05/14/2017   Procedure: CORONARY ARTERY BYPASS GRAFTING (CABG)/EVH times five using bilateral internal mammary arteries and left saphenous vein.;  Surgeon: Loreli Slot, MD;  Location: [facility] OR;  Service: Open Heart Surgery;  Laterality: N/A;  . HERNIA REPAIR    . KNEE SURGERY Left    Torn meniscus  . LEFT HEART CATH AND CORONARY ANGIOGRAPHY N/A 05/12/2017   Procedure: LEFT HEART CATH AND CORONARY ANGIOGRAPHY;  Surgeon: Marykay Lex, MD;  Location: [facility] INVASIVE CV LAB;  Service: Cardiovascular;  Laterality: N/A;  . TEE WITHOUT CARDIOVERSION N/A 05/14/2017   Procedure: TRANSESOPHAGEAL ECHOCARDIOGRAM (TEE);  Surgeon: Loreli Slot, MD;  Location: [facility] OR;  Service: Open Heart Surgery;  Laterality: N/A;    Current Meds  Medication Sig  . aspirin 325 MG EC tablet Take 1 tablet (325 mg total) by mouth daily.  Marland Kitchen atorvastatin (LIPITOR) 80 MG tablet Take 1 tablet (80 mg total) by mouth daily at 6 PM.  . lisinopril-hydrochlorothiazide (ZESTORETIC) 20-12.5 MG tablet Take 2 tablets by mouth daily.  . Metoprolol Tartrate 37.5 MG TABS Take 37.5 mg by mouth 2 (two) times daily.  . Multiple Vitamin (MULTIVITAMIN WITH MINERALS)  TABS tablet Take 1 tablet by mouth daily.  . Vitamin D, Ergocalciferol, (DRISDOL) 50000 units CAPS capsule Take 1 capsule (50,000 Units total) by mouth every 7 (seven) days.    No Known Allergies  Social History   Social History  . Marital status: Single    Spouse name: N/A  . Number of children: N/A  . Years of education: N/A   Social History Main Topics  . Smoking status: Former Games developer  . Smokeless tobacco: Current User    Types: Snuff, Chew  . Alcohol use 1.2 oz/week    2 Cans of beer per week     Comment: quit as of sept 2018  . Drug use: No  . Sexual activity: Yes   Other Topics Concern  . None   Social History Narrative  . None    Family History   family history includes AAA (abdominal aortic aneurysm) in his father; Alcohol abuse  in his father; Cancer in his mother; Coronary artery disease in his brother; Hypertension in his father; Liver disease in his father.  Wt Readings from Last 3 Encounters:  06/07/17 224 lb (101.6 kg)  06/06/17 225 lb (102.1 kg)  05/22/17 225 lb 6.4 oz (102.2 kg)    PHYSICAL EXAM BP (!) 146/80   Pulse 80   Ht 5\' 8"  (1.727 m)   Wt 224 lb (101.6 kg)   BMI 34.06 kg/m  Physical Exam  Constitutional: He is oriented to person, place, and time. He appears well-developed and well-nourished. No distress.  Moderately obese. Well groomed. He does appear to be covering quite well from his hospitalization.   HENT:  Head: Normocephalic and atraumatic.  Eyes: Pupils are equal, round, and reactive to light. EOM are normal.  Neck: Normal range of motion. Neck supple. No hepatojugular reflux and no JVD (Very difficult to see because of body habitus) present. Carotid bruit is not present.  Cardiovascular: Normal rate, regular rhythm, S1 normal, S2 normal, intact distal pulses and normal pulses.   Occasional extrasystoles are present. PMI is not displaced (Unable to palpate).  Exam reveals distant heart sounds. Exam reveals no gallop and no friction rub.   No murmur heard. Pulmonary/Chest: Effort normal and breath sounds normal. No respiratory distress. He has no wheezes. He has no rales.  Well-healed sternotomy scar. He still has his 16 place in the 3 chest tube sites. Healing well.  Abdominal: Soft. Bowel sounds are normal. He exhibits no distension. There is no tenderness. There is no rebound and no guarding.  Musculoskeletal: Normal range of motion. He exhibits no edema (Maybe trivial).  Neurological: He is alert and oriented to person, place, and time.  Skin: Skin is warm and dry. No rash noted. No erythema.  Psychiatric: He has a normal mood and affect. His behavior is normal. Judgment and thought content normal.  Nursing note and vitals reviewed.   Adult ECG Report  Rate: 80 ;  Rhythm: normal  sinus rhythm and Left atrial anomaly. Nonspecific ST and T-wave changes. Suggest different lead placement in limb leads based on the R-wave progression.;   Narrative Interpretation: Relatively stable EKG   Other studies Reviewed: Additional studies/ records that were reviewed today include:  Recent Labs:    Lab Results  Component Value Date   CHOL 193 05/12/2017   HDL 29 (L) 05/12/2017   LDLCALC 144 (H) 05/12/2017   TRIG 99 05/12/2017   CHOLHDL 6.7 05/12/2017   Lab Results  Component Value Date   HGBA1C 5.2 05/12/2017  Lab Results  Component Value Date   CREATININE 1.20 06/06/2017   BUN 22 (H) 06/06/2017   NA 136 06/06/2017   K 4.2 06/06/2017   CL 98 (L) 06/06/2017   CO2 23 05/22/2017   ASSESSMENT / PLAN: Problem List Items Addressed This Visit    Coronary artery disease involving native coronary artery of native heart with unstable angina pectoris (HCC) - Primary (Chronic)    Multivessel CAD noted on cardiac cath. Now status post CABG recovering quite well. No further anginal or heart failure symptoms He is on high-dose aspirin which we can reduce to 81 mg. On start him on Plavix because he presented with an MI. He is on high-dose statin which will continue high-dose for now and reassess. He is on beta blocker and ACE inhibitor.      Relevant Orders   Lipid panel   Comprehensive metabolic panel   Hyperlipidemia with target low density lipoprotein (LDL) cholesterol less than 70 mg/dL (Chronic)    Peri-MI plus drop check showed his LDL to be 144. He is therefore on high-dose statin. I can recheck lipids when I see him back in follow-up.      Hypertension (Chronic)    He actually presented with hypertensive emergency when he came in with his MI. Blood pressure looks pretty good today if anything a little bit high, but with his orthostatic hypotension episode couple days ago, I'm reluctant to push his pressure medications any further. In fact I wanted to make sure he stays  adequate hydrated.  With the episode occurring 2 days ago I recommended he stays adequate hydrated and he feels like that again he should make sure he drinks a bottle of water or Gatorade and elevate his feet above his head  he would also then hold his lisinopril-HCTZ.      Relevant Orders   EKG 12-Lead   Ischemic cardiomyopathy (Chronic)    Difficult to tell what his true EF was since his LV Gram suggested EF of 35-45% as did his OR TEE but his initial echo said that EF was slightly higher. I suspect it is probably more in the 40-45% range.  Not having any heart failure symptoms. He is on ACE inhibitor and beta blocker stable doses. We will probably reassess his EF and wall motion with an echocardiogram when I see him back in 3-4 months.      Obesity (BMI 30-39.9) (Chronic)    He is definitely interested in dietary modification and is now started exercising routinely. He is awaiting clearance from CT surgery to get into full activity.      Relevant Orders   EKG 12-Lead   Lipid panel   Comprehensive metabolic panel   S/P CABG x 5 (Chronic)   Relevant Orders   EKG 12-Lead   ST elevation myocardial infarction (STEMI) of lateral wall, initial episode of care (HCC) (Chronic)    Thankfully, he pretty much had an aborted STEMI some evidence of akinesis on echocardiogram with reduced ejection fraction. He is not had any further anginal symptoms or heart failure symptoms since his bypass surgery. We will probably reevaluate his EF and wall motion with an echocardiogram 3 months out.      Tobacco use (Chronic)    No longer smoking, but is still a current user chewing tobacco. We talked about the importance of stopping this as well. He is having a hard time stopping this addiction when he is trying to take care of other issues. He will  try future.       Other Visit Diagnoses    Hyperlipidemia LDL goal <70  (Chronic)      Relevant Orders   Lipid panel   Comprehensive metabolic panel      Current medicines are reviewed at length with the patient today. (+/- concerns) n/a The following changes have been made: n/a  Patient Instructions  LABS IN 3-4 MONTHS  WILL MAIL YOU LABSLIP  DO NOT EAT OR DRINK THE MORNING OF THE TEST   RECOMMENDATION IF YOU HAVE AN EPISODE LIKE YESTERDAY-  SUGGEST YOU NOT TAKE LISINOPRIL THAT DAY   OR IF YOU HAVE TAKEN IT , DO NOT TAKE THE NEXT DAY. DRINK AT LEAST 2 BOTTLE OF WATER AND ELEVATE YOU LEGS ABOVE YOUR HEART.   Your physician recommends that you schedule a follow-up appointment in 3- 4 MONTHS WITH DR Lashunda Greis.    Studies Ordered:   Orders Placed This Encounter  Procedures  . Lipid panel  . Comprehensive metabolic panel  . EKG 12-Lead      Bryan Lemma, M.D., M.S. Interventional Cardiologist   Pager # [phone] Phone # [phone] [address] [place], [place] [address]

## 2017-06-08 ENCOUNTER — Telehealth: Payer: Self-pay | Admitting: Cardiology

## 2017-06-08 MED ORDER — CLOPIDOGREL BISULFATE 75 MG PO TABS: 75.0000 mg | ORAL_TABLET | Freq: Every day | ORAL | 3 refills | Status: DC

## 2017-06-08 NOTE — Telephone Encounter (Signed)
°  New Prob  Pt states he was told he would be started on Plavix, however, nothing was called in per patient. Please call.

## 2017-06-08 NOTE — Telephone Encounter (Signed)
Spoke with pt, aware dr harding does want him to start plavix 75 mg once daily but he forgot to tell the nurse. New script sent to the pharmacy

## 2017-06-09 ENCOUNTER — Encounter: Payer: Self-pay | Admitting: Cardiology

## 2017-06-09 NOTE — Assessment & Plan Note (Signed)
No longer smoking, but is still a current user chewing tobacco. We talked about the importance of stopping this as well. He is having a hard time stopping this addiction when he is trying to take care of other issues. He will try future.

## 2017-06-09 NOTE — Assessment & Plan Note (Signed)
He is definitely interested in dietary modification and is now started exercising routinely. He is awaiting clearance from CT surgery to get into full activity.

## 2017-06-09 NOTE — Assessment & Plan Note (Signed)
Multivessel CAD noted on cardiac cath. Now status post CABG recovering quite well. No further anginal or heart failure symptoms He is on high-dose aspirin which we can reduce to 81 mg. On start him on Plavix because he presented with an MI. He is on high-dose statin which will continue high-dose for now and reassess. He is on beta blocker and ACE inhibitor.

## 2017-06-09 NOTE — Assessment & Plan Note (Signed)
Thankfully, he pretty much had an aborted STEMI some evidence of akinesis on echocardiogram with reduced ejection fraction. He is not had any further anginal symptoms or heart failure symptoms since his bypass surgery. We will probably reevaluate his EF and wall motion with an echocardiogram 3 months out.

## 2017-06-09 NOTE — Assessment & Plan Note (Signed)
Difficult to tell what his true EF was since his LV Gram suggested EF of 35-45% as did his OR TEE but his initial echo said that EF was slightly higher. I suspect it is probably more in the 40-45% range.  Not having any heart failure symptoms. He is on ACE inhibitor and beta blocker stable doses. We will probably reassess his EF and wall motion with an echocardiogram when I see him back in 3-4 months.

## 2017-06-09 NOTE — Assessment & Plan Note (Signed)
He actually presented with hypertensive emergency when he came in with his MI. Blood pressure looks pretty good today if anything a little bit high, but with his orthostatic hypotension episode couple days ago, I'm reluctant to push his pressure medications any further. In fact I wanted to make sure he stays adequate hydrated.  With the episode occurring 2 days ago I recommended he stays adequate hydrated and he feels like that again he should make sure he drinks a bottle of water or Gatorade and elevate his feet above his head  he would also then hold his lisinopril-HCTZ.

## 2017-06-09 NOTE — Assessment & Plan Note (Signed)
Peri-MI plus drop check showed his LDL to be 144. He is therefore on high-dose statin. I can recheck lipids when I see him back in follow-up.

## 2017-06-18 ENCOUNTER — Other Ambulatory Visit: Payer: Self-pay | Admitting: Thoracic Surgery (Cardiothoracic Vascular Surgery)

## 2017-06-18 DIAGNOSIS — Z951 Presence of aortocoronary bypass graft: Secondary | ICD-10-CM

## 2017-06-19 ENCOUNTER — Ambulatory Visit (INDEPENDENT_AMBULATORY_CARE_PROVIDER_SITE_OTHER): Payer: Self-pay | Admitting: Thoracic Surgery (Cardiothoracic Vascular Surgery)

## 2017-06-19 ENCOUNTER — Ambulatory Visit
Admission: RE | Admit: 2017-06-19 | Discharge: 2017-06-19 | Disposition: A | Discharge status: Home / Self Care | Payer: BLUE CROSS/BLUE SHIELD | Source: Ambulatory Visit | Attending: Thoracic Surgery (Cardiothoracic Vascular Surgery) | Admitting: Thoracic Surgery (Cardiothoracic Vascular Surgery)

## 2017-06-19 ENCOUNTER — Encounter: Payer: Self-pay | Admitting: Thoracic Surgery (Cardiothoracic Vascular Surgery)

## 2017-06-19 VITALS — BP 140/86 | HR 73 | Ht 68.0 in | Wt 230.0 lb

## 2017-06-19 DIAGNOSIS — Z951 Presence of aortocoronary bypass graft: Secondary | ICD-10-CM

## 2017-06-19 NOTE — Progress Notes (Signed)
[address]       [place] [address]             [phone]       HPI:Parker Sellers returns for a scheduled postoperative follow-up visit  Parker Sellers is a 56 year old man with multiple cardiac risk factors who presented with chest pain and ST elevation. The catheterization he was found to have left main and three-vessel disease. He underwent coronary bypass grafting 5 on 05/15/2017. We did use bilateral mammary arteries. He did well postoperatively and went home on day 4.  Since discharge he has been doing well. He is no longer taking any narcotics for pain. He's had minimal discomfort. His exercise tolerance is good. He said that he wanted to exercise on his own rather than doing cardiac rehabilitation. He has not had any anginal pain.  Past Medical History:  Diagnosis Date  . Coronary artery disease involving native coronary artery of native heart with unstable angina pectoris (HCC) 05/22/2017   Cath 05/12/2017 (aborted inferior STEMI) - Ost LM 55%, d (apical) LAD 100%, D1 100% (chronic), Om1 70%, mRCA 75% ulcerated lesion (culprit) - dRCA 55%, ostrPDA 60% & rPDA 60%. RPAV 70%, PAV2 95%. Difficult Radial Access - tortuous innominate artery.  . Hyperlipidemia   . Hypertension   . Ischemic cardiomyopathy 05/12/2017   EF by LV Gram 35-45%; Post MI Echo 45%, OR TEE 35-45%.  Gr 2 DD.  . S/P CABG x 5 05/14/2017   LIMA-LAD, seqSVG-OM1-OM2, free r-Rad-rPDA  . Sleep apnea    home CPAP  . ST elevation myocardial infarction (STEMI) of lateral wall, initial episode of care (HCC) 05/12/2017  . Tobacco use 05/12/2017    Current Outpatient Prescriptions  Medication Sig Dispense Refill  . aspirin 325 MG EC tablet Take 1 tablet (325 mg total) by mouth daily. 30 tablet 0  . atorvastatin (LIPITOR) 80 MG tablet Take 1 tablet (80 mg total) by mouth daily at 6 PM. 30 tablet 1  . clopidogrel (PLAVIX) 75 MG tablet Take 1 tablet (75 mg total) by mouth daily. 90 tablet 3  .  lisinopril-hydrochlorothiazide (ZESTORETIC) 20-12.5 MG tablet Take 2 tablets by mouth daily. 180 tablet 3  . Metoprolol Tartrate 37.5 MG TABS Take 37.5 mg by mouth 2 (two) times daily. 60 tablet 1  . Multiple Vitamin (MULTIVITAMIN WITH MINERALS) TABS tablet Take 1 tablet by mouth daily.    . Vitamin D, Ergocalciferol, (DRISDOL) 50000 units CAPS capsule Take 1 capsule (50,000 Units total) by mouth every 7 (seven) days. 12 capsule 3   No current facility-administered medications for this visit.     Physical Exam BP 140/86   Pulse 73   Ht  (1.727 m)   Wt 230 lb (104.3 kg)   SpO2 98%   BMI 34.34 kg/m  56 year old man in no acute distress Alert and oriented 3 with no focal deficits Sternal incision healing well, sternum stable Cardiac regular rate and rhythm S1 and S2 Lungs clear with equal breath sounds bilaterally Leg incisions healing well, no peripheral edema  Diagnostic Tests: CHEST  2 VIEW  COMPARISON:  05/17/2017 .  FINDINGS: Prior CABG. Stable cardiomegaly. Normal pulmonary vascularity. No pulmonary venous congestion. Interim improvement of bibasilar atelectasis. Interim clearing scratched it interim partial clearing of left pleural effusion. Small right pleural effusion.  IMPRESSION: 1. Prior CABG. Stable cardiomegaly. No pulmonary venous congestion.  2. Improved aeration in the lung bases. Interim near complete clearing of left pleural effusion.  Small right pleural effusion .   Electronically Signed   By: Maisie Fus  Register   On: 06/19/2017 11:14 I personally reviewed the chest x-ray and concur with the findings noted above  Impression: Parker Sellers is a 56 year old man who had coronary bypass grafting for three-vessel and left main disease a little over a month ago. He is doing very well at this point in time.  He has minimal discomfort and is not taking any narcotics.  He may begin driving. Appropriate precautions were discussed.  He should not  lift anything over 10 pounds until its been a full 6 weeks from the surgery and nothing over 20 pounds until 8 weeks.  He may return to work on November 5 on light duty for the first month with no lifting over 20 pounds.  Plan: Follow-up with Dr. Herbie Baltimore.  I will be happy to see him back any time the future if I can be of any further assistance with his care  Loreli Slot, MD [facility] [phone]

## 2017-07-18 ENCOUNTER — Other Ambulatory Visit: Payer: Self-pay | Admitting: Cardiology

## 2017-07-18 MED ORDER — METOPROLOL TARTRATE 25 MG PO TABS: 37.5000 mg | ORAL_TABLET | Freq: Two times a day (BID) | ORAL | 3 refills | Status: DC

## 2017-07-18 NOTE — Telephone Encounter (Signed)
Called patient. Advised he should continue current b-blocker therapy. Verified dose. Rx(s) sent to pharmacy electronically.

## 2017-07-18 NOTE — Telephone Encounter (Signed)
New message     Pt c/o medication issue:  1. Name of Medication: Metoprolol Tartrate 37.5  2. How are you currently taking this medication (dosage and times per day)?  1.5 am 1.5 pm   3. Are you having a reaction (difficulty breathing--STAT)?  no   4. What is your medication issue?  Does the patient need to continue this medication? If so he will need a refill called in,  [facility] [place]  > 90day

## 2017-07-27 ENCOUNTER — Other Ambulatory Visit: Payer: Self-pay | Admitting: Family

## 2017-07-27 NOTE — Telephone Encounter (Signed)
[facility]

## 2017-08-02 ENCOUNTER — Telehealth: Payer: Self-pay | Admitting: *Deleted

## 2017-08-02 DIAGNOSIS — I25110 Atherosclerotic heart disease of native coronary artery with unstable angina pectoris: Secondary | ICD-10-CM

## 2017-08-02 DIAGNOSIS — E785 Hyperlipidemia, unspecified: Secondary | ICD-10-CM

## 2017-08-02 DIAGNOSIS — E669 Obesity, unspecified: Secondary | ICD-10-CM

## 2017-08-02 NOTE — Telephone Encounter (Signed)
-----   Message from Tobin ChadSharon Martin V, RN sent at 06/07/2017  3:38 PM EDT ----- LABS  LIPID , CMP DUE JAN 4,[id number] MAIL Aug 07 2017

## 2017-08-02 NOTE — Telephone Encounter (Signed)
Mailed letter and labslip 

## 2017-09-01 ENCOUNTER — Ambulatory Visit (INDEPENDENT_AMBULATORY_CARE_PROVIDER_SITE_OTHER): Payer: BLUE CROSS/BLUE SHIELD | Admitting: Physician Assistant

## 2017-09-01 DIAGNOSIS — Z23 Encounter for immunization: Secondary | ICD-10-CM

## 2017-09-03 ENCOUNTER — Telehealth: Payer: Self-pay | Admitting: *Deleted

## 2017-09-03 LAB — COMPREHENSIVE METABOLIC PANEL
ALT: 17 IU/L (ref 0–44)
AST: 16 IU/L (ref 0–40)
Albumin/Globulin Ratio: 1.4 (ref 1.2–2.2)
Albumin: 4.4 g/dL (ref 3.5–5.5)
Alkaline Phosphatase: 103 IU/L (ref 39–117)
BUN/Creatinine Ratio: 12 (ref 9–20)
BUN: 18 mg/dL (ref 6–24)
Bilirubin Total: 0.4 mg/dL (ref 0.0–1.2)
CO2: 25 mmol/L (ref 20–29)
Calcium: 9.8 mg/dL (ref 8.7–10.2)
Chloride: 100 mmol/L (ref 96–106)
Creatinine, Ser: 1.54 mg/dL — ABNORMAL HIGH (ref 0.76–1.27)
GFR calc Af Amer: 57 mL/min/{1.73_m2} — ABNORMAL LOW (ref >59)
GFR calc non Af Amer: 49 mL/min/{1.73_m2} — ABNORMAL LOW (ref >59)
Globulin, Total: 3.2 g/dL (ref 1.5–4.5)
Glucose: 102 mg/dL — ABNORMAL HIGH (ref 65–99)
Potassium: 4.4 mmol/L (ref 3.5–5.2)
Sodium: 139 mmol/L (ref 134–144)
Total Protein: 7.6 g/dL (ref 6.0–8.5)

## 2017-09-03 LAB — LIPID PANEL
Chol/HDL Ratio: 4 ratio (ref 0.0–5.0)
Cholesterol, Total: 104 mg/dL (ref 100–199)
HDL: 26 mg/dL — ABNORMAL LOW (ref >39)
LDL Calculated: 50 mg/dL (ref 0–99)
Triglycerides: 140 mg/dL (ref 0–149)
VLDL Cholesterol Cal: 28 mg/dL (ref 5–40)

## 2017-09-03 MED ORDER — ATORVASTATIN CALCIUM 80 MG PO TABS: 80.0000 mg | ORAL_TABLET | Freq: Every day | ORAL | 0 refills | Status: DC

## 2017-09-03 NOTE — Telephone Encounter (Signed)
Received walk in notification that patient [facility] [place]    Patient states he had lab work today to recheck lipids and has appt on Monday to see Dr. Herbie BaltimoreHarding.   rx sent to pharmacy.  Advised will send 30 day, and can renew for 90 at OV if no changes need to be made.  Patient aware and verbalized understanding.

## 2017-09-04 HISTORY — PX: TRANSTHORACIC ECHOCARDIOGRAM: SHX275

## 2017-09-05 ENCOUNTER — Encounter: Payer: Self-pay | Admitting: *Deleted

## 2017-09-05 NOTE — Progress Notes (Signed)
Flu shot

## 2017-09-10 ENCOUNTER — Ambulatory Visit (INDEPENDENT_AMBULATORY_CARE_PROVIDER_SITE_OTHER): Payer: BLUE CROSS/BLUE SHIELD | Admitting: Cardiology

## 2017-09-10 ENCOUNTER — Encounter: Payer: Self-pay | Admitting: Cardiology

## 2017-09-10 ENCOUNTER — Other Ambulatory Visit: Payer: Self-pay | Admitting: Family

## 2017-09-10 VITALS — BP 132/85 | HR 61 | Ht 68.0 in | Wt 241.0 lb

## 2017-09-10 DIAGNOSIS — I25110 Atherosclerotic heart disease of native coronary artery with unstable angina pectoris: Secondary | ICD-10-CM | POA: Diagnosis not present

## 2017-09-10 DIAGNOSIS — Z720 Tobacco use: Secondary | ICD-10-CM

## 2017-09-10 DIAGNOSIS — E785 Hyperlipidemia, unspecified: Secondary | ICD-10-CM | POA: Diagnosis not present

## 2017-09-10 DIAGNOSIS — I255 Ischemic cardiomyopathy: Secondary | ICD-10-CM

## 2017-09-10 DIAGNOSIS — Z951 Presence of aortocoronary bypass graft: Secondary | ICD-10-CM

## 2017-09-10 DIAGNOSIS — I10 Essential (primary) hypertension: Secondary | ICD-10-CM

## 2017-09-10 DIAGNOSIS — E669 Obesity, unspecified: Secondary | ICD-10-CM

## 2017-09-10 DIAGNOSIS — I2129 ST elevation (STEMI) myocardial infarction involving other sites: Secondary | ICD-10-CM

## 2017-09-10 MED ORDER — ASPIRIN EC 81 MG PO TBEC: 81.0000 mg | DELAYED_RELEASE_TABLET | Freq: Every day | ORAL | 3 refills | Status: AC

## 2017-09-10 MED ORDER — ATORVASTATIN CALCIUM 40 MG PO TABS: 40.0000 mg | ORAL_TABLET | Freq: Every day | ORAL | 3 refills | Status: DC

## 2017-09-10 NOTE — Progress Notes (Signed)
PCP: Junie Spencer, FNP  Clinic Note: Chief Complaint  Patient presents with  . Follow-up    pt denied chest pain and SOB  . Coronary Artery Disease    CABG  . Cardiomyopathy    ischemic    HPI: Parker Sellers is a 57 y.o. male with a PMH below who presents today for Post MI-CABG follow-up.  Sept 2018: STE-ACS - aborted STEMI, with MV CAD noted on urgent Cardiac Cath --> referred for CABG.  JAQUESE Sellers was last seen on 06/07/2017 - doing well.  Had been to ER for dehydration. Noted that he had quit smoking.  Recent Hospitalizations: n/a  Studies Personally Reviewed - (if available, images/films reviewed: From Epic Chart or Care Everywhere)  Interval History: Parker Sellers presents here today for his 2nd post hospital follow-up with cardiology following his STEMI in Sept 2018. He is now fully back to work very active, but does note that he probably overdid a little bit with eating over Thanksgiving Christmas.  He is gained a lot of weight back. He has not had any further episodes of lightheadedness or dizziness.  No lethargy.  He denies any cardiac symptoms of chest tightness pressure with rest or exertion.  No PND, orthopnea or edema.  No claudication.  No rapid irregular heartbeats palpitations.  No syncope/near syncope or TIA/emesis fugax.  No melena, hematochezia hematuria or epistaxis.  He continues to be abstinent of cigarette smoking, but indicates that his wife is still smoking.  ROS: A comprehensive was performed. Pertinent symptoms noted above otherwise: Review of Systems  Constitutional: Negative for malaise/fatigue and weight loss.  HENT: Negative for congestion and nosebleeds.   Respiratory: Positive for cough (Minimal morning cough now). Negative for shortness of breath and wheezing.   Gastrointestinal: Negative for abdominal pain, constipation and heartburn.  Musculoskeletal: Negative for joint pain.  Neurological: Negative for dizziness (episode noted in  history of present illness) and focal weakness.  Endo/Heme/Allergies: Does not bruise/bleed easily.  Psychiatric/Behavioral: Negative for memory loss. The patient is not nervous/anxious and does not have insomnia.   All other systems reviewed and are negative.  I have reviewed and (if needed) personally updated the patient's problem list, medications, allergies, past medical and surgical history, social and family history.   Past Medical History:  Diagnosis Date  . Coronary artery disease involving native coronary artery of native heart with unstable angina pectoris (HCC) 05/22/2017   Cath 05/12/2017 (aborted inferior STEMI) - Ost LM 55%, d (apical) LAD 100%, D1 100% (chronic), Om1 70%, mRCA 75% ulcerated lesion (culprit) - dRCA 55%, ostrPDA 60% & rPDA 60%. RPAV 70%, PAV2 95%. Difficult Radial Access - tortuous innominate artery.  . Hyperlipidemia   . Hypertension   . Ischemic cardiomyopathy 05/12/2017   EF by LV Gram 35-45%; Post MI Echo 45%, OR TEE 35-45%.  Gr 2 DD.  . S/P CABG x 5 05/14/2017   LIMA-LAD, seqSVG-OM1-OM2, free r-Rad-rPDA  . Sleep apnea    home CPAP  . ST elevation myocardial infarction (STEMI) of lateral wall, initial episode of care (HCC) 05/12/2017  . Tobacco use 05/12/2017    Past Surgical History:  Procedure Laterality Date  . CORONARY ARTERY BYPASS GRAFT N/A 05/14/2017   Procedure: CORONARY ARTERY BYPASS GRAFTING (CABG)/EVH x 5 five using bilateral internal mammary arteries and left saphenous vein.;  Surgeon: Loreli Slot, MD;  Location: [facility] OR;  Service: Open Heart Surgery(::) LIMA-LAD, seqSVG-OM1-OM2, free r-Rad-rPDA  . HERNIA REPAIR    . KNEE  SURGERY Left    Torn meniscus  . LEFT HEART CATH AND CORONARY ANGIOGRAPHY N/A 05/12/2017   Procedure: LEFT HEART CATH AND CORONARY ANGIOGRAPHY;  Surgeon: Marykay Lex, MD;  Location: [facility] INVASIVE CV LAB: MV CAD - ostLM 55%, dLAD 100%, D1 100%, OM 1 70%, mRCA 75%-dRCA 55%, ostPDA 60% - PDA 60^, PL1 70%, PK2 95%.  Mod-Severe  LV Dysfunction - ~EF 35-45%  . TEE WITHOUT CARDIOVERSION N/A 05/14/2017   Procedure: TRANSESOPHAGEAL ECHOCARDIOGRAM (TEE);  Surgeon: Loreli Slot, MD;  Location: [facility] OR;  Service: Open Heart Surgery: Mod Concentric LVH. EF ~35-40%.  Inferior hypokinesis & inferolateral hypokinesis. Dilated MV & TV annuli with only trace MR/TR.  Mild RV dilation with moderately reduced function.  . TRANSTHORACIC ECHOCARDIOGRAM  05/13/2017   Mod LVH. Mildly reduced LVED ~45-50%. Akinesis of inferolateral & inferoseptal walls. Gr 2 DD. Mild Ascending Aortic dilation ~3.8 cm.    LEFT HEART CATH AND CORONARY ANGIOGRAPHY: Presented as ~ STEMI 05/12/2017   Wall Motion       Diagnostic Diagram        Current Meds  Medication Sig  . clopidogrel (PLAVIX) 75 MG tablet Take 1 tablet (75 mg total) by mouth daily.  Marland Kitchen lisinopril-hydrochlorothiazide (ZESTORETIC) 20-12.5 MG tablet Take 2 tablets by mouth daily.  . metoprolol tartrate (LOPRESSOR) 25 MG tablet Take 1.5 tablets (37.5 mg total) 2 (two) times daily by mouth.  . Multiple Vitamin (MULTIVITAMIN WITH MINERALS) TABS tablet Take 1 tablet by mouth daily.  . [DISCONTINUED] aspirin 325 MG EC tablet Take 1 tablet (325 mg total) by mouth daily.  . [DISCONTINUED] atorvastatin (LIPITOR) 80 MG tablet Take 1 tablet (80 mg total) by mouth daily at 6 PM.    No Known Allergies  Social History   Tobacco Use  . Smoking status: Former Games developer  . Smokeless tobacco: Current User    Types: Snuff, Chew  Substance Use Topics  . Alcohol use: Yes    Alcohol/week: 1.2 oz    Types: 2 Cans of beer per week    Comment: quit as of sept 2018  . Drug use: No   Social History   Social History Narrative  . Not on file    Family History  family history includes AAA (abdominal aortic aneurysm) in his father; Alcohol abuse in his father; Cancer in his mother; Coronary artery disease in his brother; Hypertension in his father; Liver disease in his father.  Wt Readings from  Last 3 Encounters:  09/10/17 241 lb (109.3 kg)  06/19/17 230 lb (104.3 kg)  06/07/17 224 lb (101.6 kg)    PHYSICAL EXAM BP 132/85   Pulse 61   Ht 5\' 8"  (1.727 m)   Wt 241 lb (109.3 kg)   BMI 36.64 kg/m  Physical Exam  Constitutional: He is oriented to person, place, and time. He appears well-developed and well-nourished. No distress.  Moderately obese. Well groomed. He does appear to be covering quite well from his hospitalization.   HENT:  Head: Normocephalic and atraumatic.  Eyes: Conjunctivae and EOM are normal. Pupils are equal, round, and reactive to light.  Neck: Normal range of motion. Neck supple. No hepatojugular reflux and no JVD (Very difficult to see because of body habitus) present. Carotid bruit is not present.  Cardiovascular: Normal rate, regular rhythm, S1 normal, S2 normal, intact distal pulses and normal pulses.  Occasional extrasystoles are present. PMI is not displaced (Unable to palpate). Exam reveals distant heart sounds. Exam reveals no gallop and no  friction rub.  No murmur heard. Pulmonary/Chest: Effort normal and breath sounds normal. No respiratory distress. He has no wheezes. He has no rales.  Well-healed sternotomy scar. He still has his 16 place in the 3 chest tube sites. Healing well.  Abdominal: Soft. Bowel sounds are normal. He exhibits no distension. There is no tenderness. There is no rebound.  Musculoskeletal: Normal range of motion. He exhibits no edema (Maybe trivial).  Neurological: He is alert and oriented to person, place, and time. No cranial nerve deficit.  Skin: Skin is warm and dry.  Psychiatric: He has a normal mood and affect. His behavior is normal. Judgment and thought content normal.  Nursing note and vitals reviewed.   Adult ECG Report n/a  Other studies Reviewed: Additional studies/ records that were reviewed today include:  Recent Labs:    Lab Results  Component Value Date   CHOL 104 09/03/2017   HDL 26 (L) 09/03/2017    LDLCALC 50 09/03/2017   TRIG 140 09/03/2017   CHOLHDL 4.0 09/03/2017   Lab Results  Component Value Date   HGBA1C 5.2 05/12/2017   Lab Results  Component Value Date   CREATININE 1.54 (H) 09/03/2017   BUN 18 09/03/2017   NA 139 09/03/2017   K 4.4 09/03/2017   CL 100 09/03/2017   CO2 25 09/03/2017    ASSESSMENT / PLAN: No no Problem List Items Addressed This Visit    Coronary artery disease involving native coronary artery of native heart with unstable angina pectoris (HCC) - Primary (Chronic)    Multivessel coronary disease with 5 vessel CABG doing quite well.  I suspect that his EF may very well improved.  No active angina or heart failure symptoms. He is on aspirin and Plavix given the presentation of STEMI.  We can reduce his aspirin to 81 mg. Well-controlled lipids reducing atorvastatin to 40 m daily. On ACE inhibitor and beta-blocker stable dose.      Relevant Medications   aspirin EC 81 MG tablet   atorvastatin (LIPITOR) 40 MG tablet   Other Relevant Orders   ECHOCARDIOGRAM COMPLETE   Comprehensive metabolic panel   Essential hypertension (Chronic)    Well-controlled blood pressure today on current dose of metoprolol and lisinopril and HCTZ.  Borderline bradycardia would preclude fully titrating up beta-blocker.  Once again to a stable dose, the plan will be to switch him to Toprol.      Relevant Medications   aspirin EC 81 MG tablet   atorvastatin (LIPITOR) 40 MG tablet   Hyperlipidemia with target low density lipoprotein (LDL) cholesterol less than 70 mg/dL (Chronic)    Excellent drop in LDL down to 50 on 80 mg of atorvastatin.  Plan will be for him to alternate 80 mg 40 mg daily until he completes his current 80 mg bottle, then we will reduce his prescription to 40 mg daily. Recheck in roughly 4 months.      Relevant Medications   aspirin EC 81 MG tablet   atorvastatin (LIPITOR) 40 MG tablet   Other Relevant Orders   Comprehensive metabolic panel   Lipid  panel   Ischemic cardiomyopathy (Chronic)    Improved EF by echo as compared to left ventriculography.  No active heart failure symptoms. I would like to reassess his ejection fraction just to have a new baseline. He is on stable medications including beta-blocker, ACE inhibitor.  No diuretic requirement.  Plan: 2D echocardiogram.      Relevant Medications   aspirin EC 81  MG tablet   atorvastatin (LIPITOR) 40 MG tablet   Other Relevant Orders   ECHOCARDIOGRAM COMPLETE   Comprehensive metabolic panel   Obesity (BMI 16-10.930-39.9) (Chronic)    Unfortunately, he gained quite a bit of weight back after his CABG.  He is now trying to work on getting back into full exercise.      S/P CABG x 5 (Chronic)    Cleared for full activity by Dr. Dorris FetchHendrickson.      Relevant Orders   ECHOCARDIOGRAM COMPLETE   ST elevation myocardial infarction (STEMI) of lateral wall, initial episode of care [facility](HCC) (Chronic)    Doing well after what appeared to be of an aborted STEMI.  Did have some mild reduced ejection fraction with akinesis and inferolateral wall in the setting of his peri-infarct echo.  No active heart failure symptoms or any recurrent angina symptoms following CABG.  Getting back to regular routine activities.      Relevant Medications   aspirin EC 81 MG tablet   atorvastatin (LIPITOR) 40 MG tablet   Tobacco use (Chronic)    Doing well with smoking cessation.  Now he does try to get his wife to quit.        [name] is doing very well, overall stable from cardiac standpoint.  Unfortunately he has gained back most of the weight that he lost perioperatively.  Goal for now is to try and lose the weight back again. He has been successful in quitting smoking.  He has not had any further anginal symptoms. Lipid control has proved quite successful.  We will wean him down to 40 mg of atorvastatin.  We can also reduce his aspirin down to 81 mg daily.  Current medicines are reviewed at length with the  patient today. (+/- concerns) n/a The following changes have been made: n/a  Patient Instructions  MEDICATION INSTRUCTIONS   DECREASE ASPIRIN TO 81 MG ONE TABLET DAILY  WITH CURRENT BOTTLE OF ATORVASTATIN TAKE 80 MG ALTERNATE WITH 40 MG  ( 1/2 TABLET) DAILY ,OCE BOTTLE IS COMPLETE START TAKING 40 MG DAILY.     SCHEDULE AT [address] Your physician has requested that you have an echocardiogram. Echocardiography is a painless test that uses sound waves to create images of your heart. It provides your doctor with information about the size and shape of your heart and how well your heart's chambers and valves are working. This procedure takes approximately one hour. There are no restrictions for this procedure.  LABS IN 4 MONTHS BEFORE NEXT APPOINTMENT WILL MAIL YOU LAB SLIP CLOSER TO TIME CMP LIPIDS  Your physician recommends that you schedule a follow-up appointment in MAY 2019 WITH DR HARDING.   If you need a refill on your cardiac medications before your next appointment, please call your pharmacy.    Studies Ordered:   Orders Placed This Encounter  Procedures  . Comprehensive metabolic panel  . Lipid panel  . ECHOCARDIOGRAM COMPLETE      Bryan Lemmaavid Harding, M.D., M.S. Interventional Cardiologist   Pager # [phone] Phone # [phone] [address] [place], [place] [address]

## 2017-09-10 NOTE — Patient Instructions (Addendum)
MEDICATION INSTRUCTIONS   DECREASE ASPIRIN TO 81 MG ONE TABLET DAILY  WITH CURRENT BOTTLE OF ATORVASTATIN TAKE 80 MG ALTERNATE WITH 40 MG  ( 1/2 TABLET) DAILY ,OCE BOTTLE IS COMPLETE START TAKING 40 MG DAILY.     SCHEDULE AT [address] Your physician has requested that you have an echocardiogram. Echocardiography is a painless test that uses sound waves to create images of your heart. It provides your doctor with information about the size and shape of your heart and how well your heart's chambers and valves are working. This procedure takes approximately one hour. There are no restrictions for this procedure.  LABS IN 4 MONTHS BEFORE NEXT APPOINTMENT WILL MAIL YOU LAB SLIP CLOSER TO TIME CMP LIPIDS  Your physician recommends that you schedule a follow-up appointment in MAY 2019 WITH DR HARDING.   If you need a refill on your cardiac medications before your next appointment, please call your pharmacy.

## 2017-09-11 ENCOUNTER — Encounter: Payer: Self-pay | Admitting: Cardiology

## 2017-09-11 NOTE — Assessment & Plan Note (Signed)
Improved EF by echo as compared to left ventriculography.  No active heart failure symptoms. I would like to reassess his ejection fraction just to have a new baseline. He is on stable medications including beta-blocker, ACE inhibitor.  No diuretic requirement.  Plan: 2D echocardiogram.

## 2017-09-11 NOTE — Assessment & Plan Note (Signed)
Doing well with smoking cessation.  Now he does try to get his wife to quit.

## 2017-09-11 NOTE — Assessment & Plan Note (Signed)
Unfortunately, he gained quite a bit of weight back after his CABG.  He is now trying to work on getting back into full exercise.

## 2017-09-11 NOTE — Assessment & Plan Note (Signed)
Cleared for full activity by Dr. Dorris FetchHendrickson.

## 2017-09-11 NOTE — Assessment & Plan Note (Signed)
Well-controlled blood pressure today on current dose of metoprolol and lisinopril and HCTZ.  Borderline bradycardia would preclude fully titrating up beta-blocker.  Once again to a stable dose, the plan will be to switch him to Toprol.

## 2017-09-11 NOTE — Assessment & Plan Note (Signed)
Doing well after what appeared to be of an aborted STEMI.  Did have some mild reduced ejection fraction with akinesis and inferolateral wall in the setting of his peri-infarct echo.  No active heart failure symptoms or any recurrent angina symptoms following CABG.  Getting back to regular routine activities.

## 2017-09-11 NOTE — Assessment & Plan Note (Signed)
Multivessel coronary disease with 5 vessel CABG doing quite well.  I suspect that his EF may very well improved.  No active angina or heart failure symptoms. He is on aspirin and Plavix given the presentation of STEMI.  We can reduce his aspirin to 81 mg. Well-controlled lipids reducing atorvastatin to 40 m daily. On ACE inhibitor and beta-blocker stable dose.

## 2017-09-11 NOTE — Assessment & Plan Note (Signed)
Excellent drop in LDL down to 50 on 80 mg of atorvastatin.  Plan will be for him to alternate 80 mg 40 mg daily until he completes his current 80 mg bottle, then we will reduce his prescription to 40 mg daily. Recheck in roughly 4 months.

## 2017-09-24 ENCOUNTER — Ambulatory Visit (HOSPITAL_COMMUNITY): Payer: BLUE CROSS/BLUE SHIELD | Attending: Internal Medicine

## 2017-09-24 ENCOUNTER — Other Ambulatory Visit: Payer: Self-pay | Admitting: Cardiology

## 2017-09-24 ENCOUNTER — Other Ambulatory Visit: Payer: Self-pay

## 2017-09-24 DIAGNOSIS — Z87891 Personal history of nicotine dependence: Secondary | ICD-10-CM | POA: Insufficient documentation

## 2017-09-24 DIAGNOSIS — Z951 Presence of aortocoronary bypass graft: Secondary | ICD-10-CM | POA: Diagnosis not present

## 2017-09-24 DIAGNOSIS — I25110 Atherosclerotic heart disease of native coronary artery with unstable angina pectoris: Secondary | ICD-10-CM

## 2017-09-24 DIAGNOSIS — E785 Hyperlipidemia, unspecified: Secondary | ICD-10-CM | POA: Diagnosis not present

## 2017-09-24 DIAGNOSIS — E669 Obesity, unspecified: Secondary | ICD-10-CM | POA: Insufficient documentation

## 2017-09-24 DIAGNOSIS — I255 Ischemic cardiomyopathy: Secondary | ICD-10-CM

## 2017-09-24 DIAGNOSIS — I10 Essential (primary) hypertension: Secondary | ICD-10-CM | POA: Diagnosis not present

## 2017-09-24 DIAGNOSIS — Z6836 Body mass index (BMI) 36.0-36.9, adult: Secondary | ICD-10-CM | POA: Diagnosis not present

## 2017-09-24 HISTORY — PX: TRANSTHORACIC ECHOCARDIOGRAM: SHX275

## 2017-09-24 MED ORDER — PERFLUTREN LIPID MICROSPHERE: 1.0000 mL | INTRAVENOUS | Status: AC | PRN

## 2017-09-24 MED ORDER — PERFLUTREN LIPID MICROSPHERE
1.0000 mL | INTRAVENOUS | Status: DC | PRN
  Administered 2017-09-24: 3 mL via INTRAVENOUS

## 2017-09-24 MED ORDER — PERFLUTREN LIPID MICROSPHERE: 1.0000 mL | INTRAVENOUS | Status: DC | PRN

## 2017-09-25 LAB — ECHOCARDIOGRAM LIMITED
Ao-asc: 34 cm
E decel time: 218 msec
E/e' ratio: 8.24
FS: 27 % — AB (ref 28–44)
IVS/LV PW RATIO, ED: 1.21
LA ID, A-P, ES: 55 mm
LA diam end sys: 55 mm
LA diam index: 2.36 cm/m2
LA vol A4C: 67 ml
LA vol index: 34.7 mL/m2
LA vol: 81 mL
LV E/e' medial: 8.24
LV E/e'average: 8.24
LV PW d: 13 mm — AB (ref 0.6–1.1)
LV dias vol index: 53 mL/m2
LV dias vol: 123 mL (ref 62–150)
LV e' LATERAL: 11.2 cm/s
LV sys vol index: 26 mL/m2
LV sys vol: 61 mL (ref 21–61)
LVOT SV: 89 mL
LVOT VTI: 19.7 cm
LVOT area: 4.52 cm2
LVOT diameter: 24 mm
LVOT peak grad rest: 3 mmHg
LVOT peak vel: 91.7 cm/s
Lateral S' vel: 8.87 cm/s
MV Dec: 218
MV Peak grad: 3 mmHg
MV pk A vel: 69.1 m/s
MV pk E vel: 92.3 m/s
RV sys press: 18 mmHg
Reg peak vel: 191 cm/s
Simpson's disk: 50
Stroke v: 62 ml
TDI e' lateral: 11.2
TDI e' medial: 5.56
TR max vel: 191 cm/s

## 2017-10-01 ENCOUNTER — Ambulatory Visit: Payer: BLUE CROSS/BLUE SHIELD | Admitting: Family

## 2017-10-01 ENCOUNTER — Encounter: Payer: Self-pay | Admitting: Family

## 2017-10-01 VITALS — BP 106/67 | HR 50 | Temp 97.1°F | Ht 68.0 in | Wt 239.4 lb

## 2017-10-01 DIAGNOSIS — Z1211 Encounter for screening for malignant neoplasm of colon: Secondary | ICD-10-CM

## 2017-10-01 DIAGNOSIS — E8881 Metabolic syndrome: Secondary | ICD-10-CM | POA: Diagnosis not present

## 2017-10-01 DIAGNOSIS — I10 Essential (primary) hypertension: Secondary | ICD-10-CM | POA: Diagnosis not present

## 2017-10-01 DIAGNOSIS — Z1159 Encounter for screening for other viral diseases: Secondary | ICD-10-CM

## 2017-10-01 DIAGNOSIS — I25110 Atherosclerotic heart disease of native coronary artery with unstable angina pectoris: Secondary | ICD-10-CM

## 2017-10-01 DIAGNOSIS — E785 Hyperlipidemia, unspecified: Secondary | ICD-10-CM

## 2017-10-01 DIAGNOSIS — Z951 Presence of aortocoronary bypass graft: Secondary | ICD-10-CM

## 2017-10-01 NOTE — Patient Instructions (Signed)
Fat and Cholesterol Restricted Diet Getting too much fat and cholesterol in your diet may cause health problems. Following this diet helps keep your fat and cholesterol at normal levels. This can keep you from getting sick. What types of fat should I choose?  Choose monosaturated and polyunsaturated fats. These are found in foods such as olive oil, canola oil, flaxseeds, walnuts, almonds, and seeds.  Eat more omega-3 fats. Good choices include salmon, mackerel, sardines, tuna, flaxseed oil, and ground flaxseeds.  Limit saturated fats. These are in animal products such as meats, butter, and cream. They can also be in plant products such as palm oil, palm kernel oil, and coconut oil.  Avoid foods with partially hydrogenated oils in them. These contain trans fats. Examples of foods that have trans fats are stick margarine, some tub margarines, cookies, crackers, and other baked goods. What general guidelines do I need to follow?  Check food labels. Look for the words "trans fat" and "saturated fat."  When preparing a meal: ? Fill half of your plate with vegetables and green salads. ? Fill one fourth of your plate with whole grains. Look for the word "whole" as the first word in the ingredient list. ? Fill one fourth of your plate with lean protein foods.  Eat more foods that have fiber, like apples, carrots, beans, peas, and barley.  Eat more home-cooked foods. Eat less at restaurants and buffets.  Limit or avoid alcohol.  Limit foods high in starch and sugar.  Limit fried foods.  Cook foods without frying them. Baking, boiling, grilling, and broiling are all great options.  Lose weight if you are overweight. Losing even a small amount of weight can help your overall health. It can also help prevent diseases such as diabetes and heart disease. What foods can I eat? Grains Whole grains, such as whole wheat or whole grain breads, crackers, cereals, and pasta. Unsweetened oatmeal,  bulgur, barley, quinoa, or brown rice. Corn or whole wheat flour tortillas. Vegetables Fresh or frozen vegetables (raw, steamed, roasted, or grilled). Green salads. Fruits All fresh, canned (in natural juice), or frozen fruits. Meat and Other Protein Products Ground beef (85% or leaner), grass-fed beef, or beef trimmed of fat. Skinless chicken or turkey. Ground chicken or turkey. Pork trimmed of fat. All fish and seafood. Eggs. Dried beans, peas, or lentils. Unsalted nuts or seeds. Unsalted canned or dry beans. Dairy Low-fat dairy products, such as skim or 1% milk, 2% or reduced-fat cheeses, low-fat ricotta or cottage cheese, or plain low-fat yogurt. Fats and Oils Tub margarines without trans fats. Light or reduced-fat mayonnaise and salad dressings. Avocado. Olive, canola, sesame, or safflower oils. Natural peanut or almond butter (choose ones without added sugar and oil). The items listed above may not be a complete list of recommended foods or beverages. Contact your dietitian for more options. What foods are not recommended? Grains White bread. White pasta. White rice. Cornbread. Bagels, pastries, and croissants. Crackers that contain trans fat. Vegetables White potatoes. Corn. Creamed or fried vegetables. Vegetables in a cheese sauce. Fruits Dried fruits. Canned fruit in light or heavy syrup. Fruit juice. Meat and Other Protein Products Fatty cuts of meat. Ribs, chicken wings, bacon, sausage, bologna, salami, chitterlings, fatback, hot dogs, bratwurst, and packaged luncheon meats. Liver and organ meats. Dairy Whole or 2% milk, cream, half-and-half, and cream cheese. Whole milk cheeses. Whole-fat or sweetened yogurt. Full-fat cheeses. Nondairy creamers and whipped toppings. Processed cheese, cheese spreads, or cheese curds. Sweets and Desserts Corn   syrup, sugars, honey, and molasses. Candy. Jam and jelly. Syrup. Sweetened cereals. Cookies, pies, cakes, donuts, muffins, and ice  cream. Fats and Oils Butter, stick margarine, lard, shortening, ghee, or bacon fat. Coconut, palm kernel, or palm oils. Beverages Alcohol. Sweetened drinks (such as sodas, lemonade, and fruit drinks or punches). The items listed above may not be a complete list of foods and beverages to avoid. Contact your dietitian for more information. This information is not intended to replace advice given to you by your health care provider. Make sure you discuss any questions you have with your health care provider. Document Released: 02/20/2012 Document Revised: 04/27/2016 Document Reviewed: 11/20/2013 Elsevier Interactive Patient Education  2018 [employer]  

## 2017-10-01 NOTE — Addendum Note (Signed)
Addended by: Margorie JohnJOHNSON, Demarcus Thielke M on: 10/01/2017 04:05 PM   Modules accepted: Orders

## 2017-10-01 NOTE — Progress Notes (Signed)
Subjective:    Patient ID: Parker Sellers, male    DOB: 11/20/1960, 56 y.o.   MRN: [id number]  PT presents to the office today for chronic follow up. PT is followed by Cardiologists every 4 months after his MI on 05/12/17.  Hypertension  This is a chronic problem. The current episode started more than 1 year ago. The problem has been resolved since onset. The problem is controlled. Pertinent negatives include no headaches, malaise/fatigue, peripheral edema or shortness of breath. Risk factors for coronary artery disease include dyslipidemia, obesity, male gender, smoking/tobacco exposure and family history. The current treatment provides moderate improvement. Hypertensive end-organ damage includes CAD/MI.  Hyperlipidemia  This is a chronic problem. The current episode started more than 1 year ago. The problem is controlled. Recent lipid tests were reviewed and are normal. Exacerbating diseases include obesity. Pertinent negatives include no shortness of breath. Current antihyperlipidemic treatment includes statins. The current treatment provides moderate improvement of lipids. Risk factors for coronary artery disease include dyslipidemia, family history, male sex, obesity, hypertension and a sedentary lifestyle.  Metabolic Syndrome PT states he has an active job. States he is trying to do a "Heart Healthy" diet since his MI.    Review of Systems  Constitutional: Negative for malaise/fatigue.  Respiratory: Negative for shortness of breath.   Neurological: Negative for headaches.  All other systems reviewed and are negative.      Objective:   Physical Exam  Constitutional: He is oriented to person, place, and time. He appears well-developed and well-nourished. No distress.  HENT:  Head: Normocephalic.  Right Ear: External ear normal.  Left Ear: External ear normal.  Nose: Nose normal.  Mouth/Throat: Oropharynx is clear and moist.  Eyes: Pupils are equal, round, and reactive to light.  Right eye exhibits no discharge. Left eye exhibits no discharge.  Neck: Normal range of motion. Neck supple. No thyromegaly present.  Cardiovascular: Normal rate, regular rhythm, normal heart sounds and intact distal pulses.  No murmur heard. Pulmonary/Chest: Effort normal and breath sounds normal. No respiratory distress. He has no wheezes.  Abdominal: Soft. Bowel sounds are normal. He exhibits no distension. There is no tenderness.  Musculoskeletal: Normal range of motion. He exhibits no edema or tenderness.  Neurological: He is alert and oriented to person, place, and time.  Skin: Skin is warm and dry. No rash noted. No erythema.  Psychiatric: He has a normal mood and affect. His behavior is normal. Judgment and thought content normal.  Vitals reviewed.     BP 106/67   Pulse (!) 50   Temp (!) 97.1 F (36.2 C) (Oral)   Ht 5' 8" (1.727 m)   Wt 239 lb 6.4 oz (108.6 kg)   BMI 36.40 kg/m      Assessment & Plan:  1. Essential hypertension - CMP14+EGFR  2. Coronary artery disease involving native coronary artery of native heart with unstable angina pectoris (HCC) - CMP14+EGFR  3. Morbid obesity (HCC) - CMP14+EGFR  4. S/P CABG x 5  - CMP14+EGFR  5. Hyperlipidemia with target low density lipoprotein (LDL) cholesterol less than 70 mg/dL - CMP14+EGFR  6. Metabolic syndrome - CMP14+EGFR  7. Need for hepatitis C screening test  - CMP14+EGFR - Hepatitis C antibody   Continue all meds Labs pending Health Maintenance reviewed Diet and exercise encouraged RTO 4 months   Christy Hawks, FNP  

## 2017-10-02 LAB — CMP14+EGFR
ALT: 22 IU/L (ref 0–44)
AST: 18 IU/L (ref 0–40)
Albumin/Globulin Ratio: 1.7 (ref 1.2–2.2)
Albumin: 4.6 g/dL (ref 3.5–5.5)
Alkaline Phosphatase: 89 IU/L (ref 39–117)
BUN/Creatinine Ratio: 13 (ref 9–20)
BUN: 17 mg/dL (ref 6–24)
Bilirubin Total: 0.3 mg/dL (ref 0.0–1.2)
CO2: 23 mmol/L (ref 20–29)
Calcium: 9.8 mg/dL (ref 8.7–10.2)
Chloride: 102 mmol/L (ref 96–106)
Creatinine, Ser: 1.35 mg/dL — ABNORMAL HIGH (ref 0.76–1.27)
GFR calc Af Amer: 67 mL/min/{1.73_m2} (ref >59)
GFR calc non Af Amer: 58 mL/min/{1.73_m2} — ABNORMAL LOW (ref >59)
Globulin, Total: 2.7 g/dL (ref 1.5–4.5)
Glucose: 97 mg/dL (ref 65–99)
Potassium: 4.5 mmol/L (ref 3.5–5.2)
Sodium: 140 mmol/L (ref 134–144)
Total Protein: 7.3 g/dL (ref 6.0–8.5)

## 2017-10-02 LAB — HEPATITIS C ANTIBODY: Hep C Virus Ab: <0.1 s/co ratio (ref 0.0–0.9)

## 2017-10-03 LAB — FECAL OCCULT BLOOD, IMMUNOCHEMICAL: Fecal Occult Bld: NEGATIVE

## 2017-10-05 ENCOUNTER — Telehealth: Payer: Self-pay | Admitting: *Deleted

## 2017-10-05 NOTE — Telephone Encounter (Signed)
LEFT MESSAGE TO CALL BACK - IN REGARDS TO ECHO REPORT 

## 2017-10-05 NOTE — Telephone Encounter (Signed)
-----   Message from Marykay Lexavid W Harding, MD sent at 09/26/2017  6:09 PM EST ----- Essentially normal echo cardiogram over end of normal pump function but no regional wall motion abnormality.  This would argue against any potential heart attack.  Also normal valves. Mildly thickened left ventricle which could go along with some diastolic dysfunction.  Bryan Lemmaavid Harding, MD

## 2017-10-21 IMAGING — DX DG CHEST 1V PORT
1 series · 1 of 1 positions shown · non-contrast
Comparison: No recent prior.

CLINICAL DATA: Chest tubes.

EXAM:
PORTABLE CHEST 1 VIEW

[chest ap]
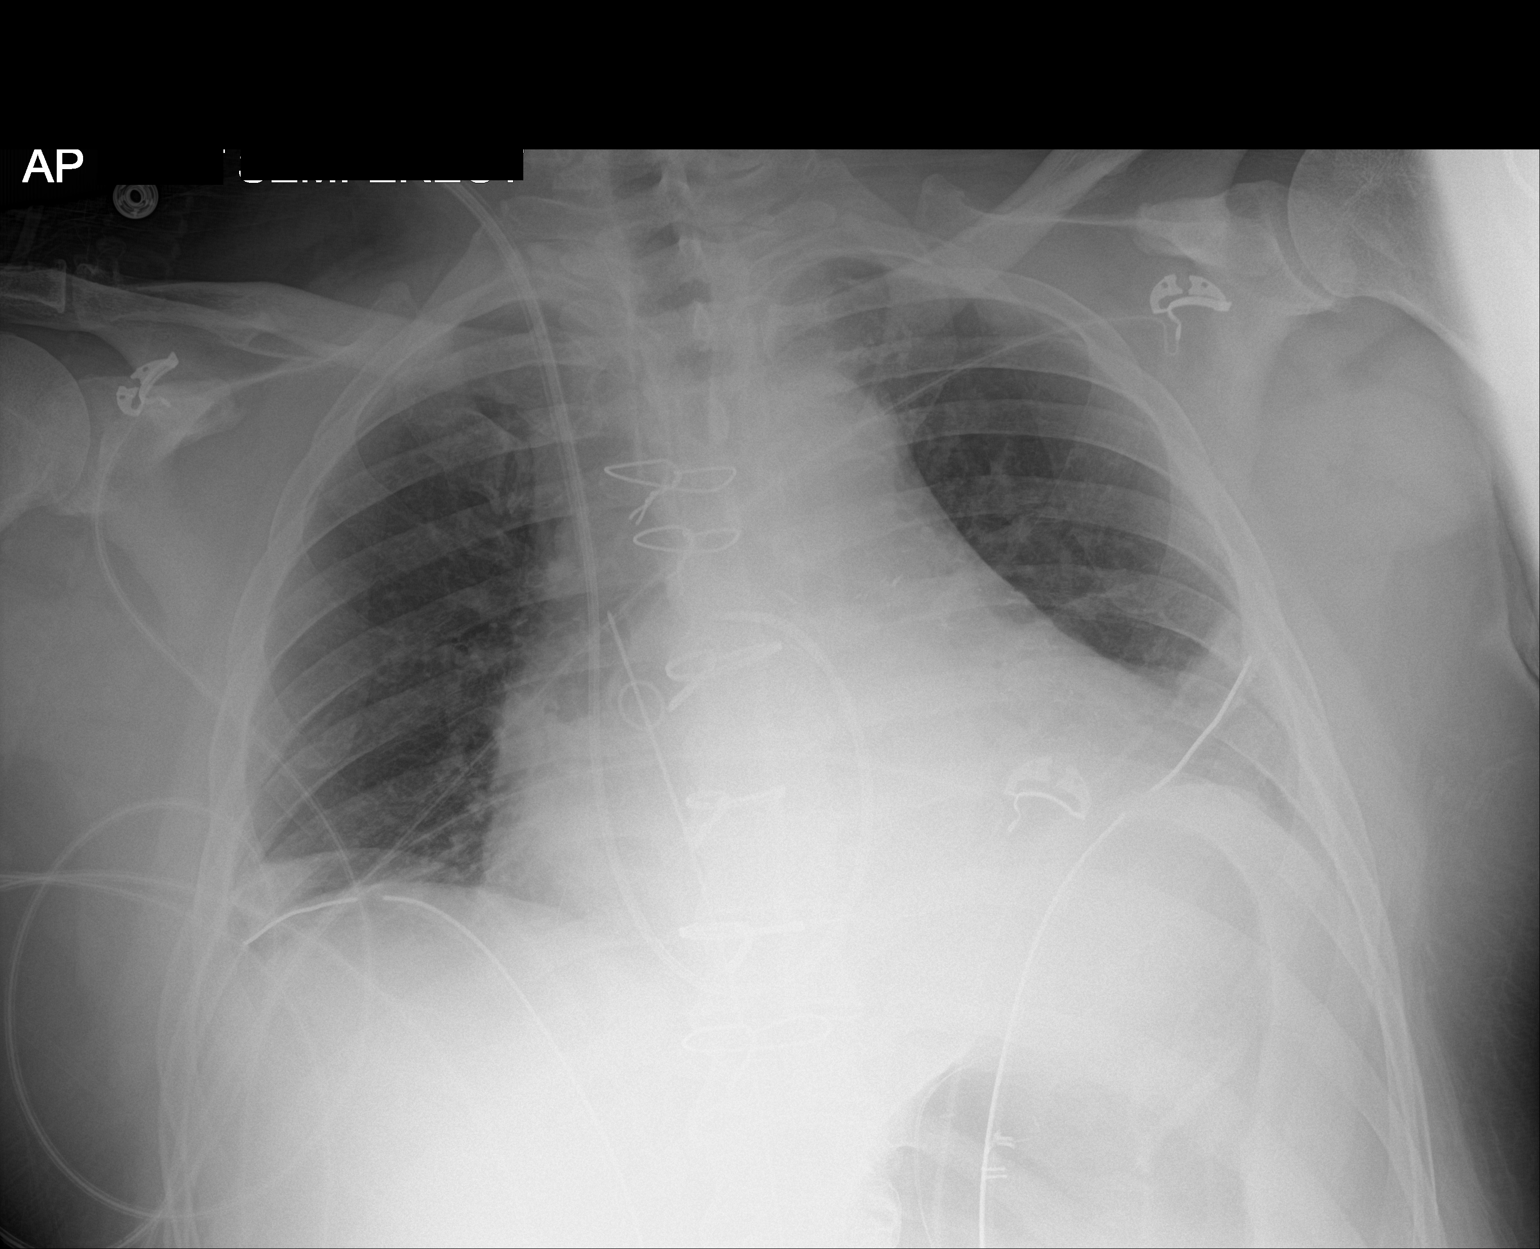

[1 of 1 positions shown; findings below may reference images not displayed]

FINDINGS: Interim extubation and removal of NG tube. Bilateral chest tubes,
mediastinal drainage catheter, Swan-Ganz catheter in stable
position.

Progressive bibasilar atelectasis. Right medial base infiltrate
cannot be excluded .
IMPRESSION: 1. Interim removal of endotracheal tube and NG tube. Remaining lines
and tubes including bilateral chest tubes in stable position. No
pneumothorax.

2. Progressive bibasilar atelectasis. Medial right base infiltrate
cannot be excluded .

## 2017-10-23 IMAGING — CR DG CHEST 2V
2 series · 2 of 2 positions shown · non-contrast
Comparison: 05/16/2017 .

CLINICAL DATA: CABG.

EXAM:
CHEST  2 VIEW

[chest pa]
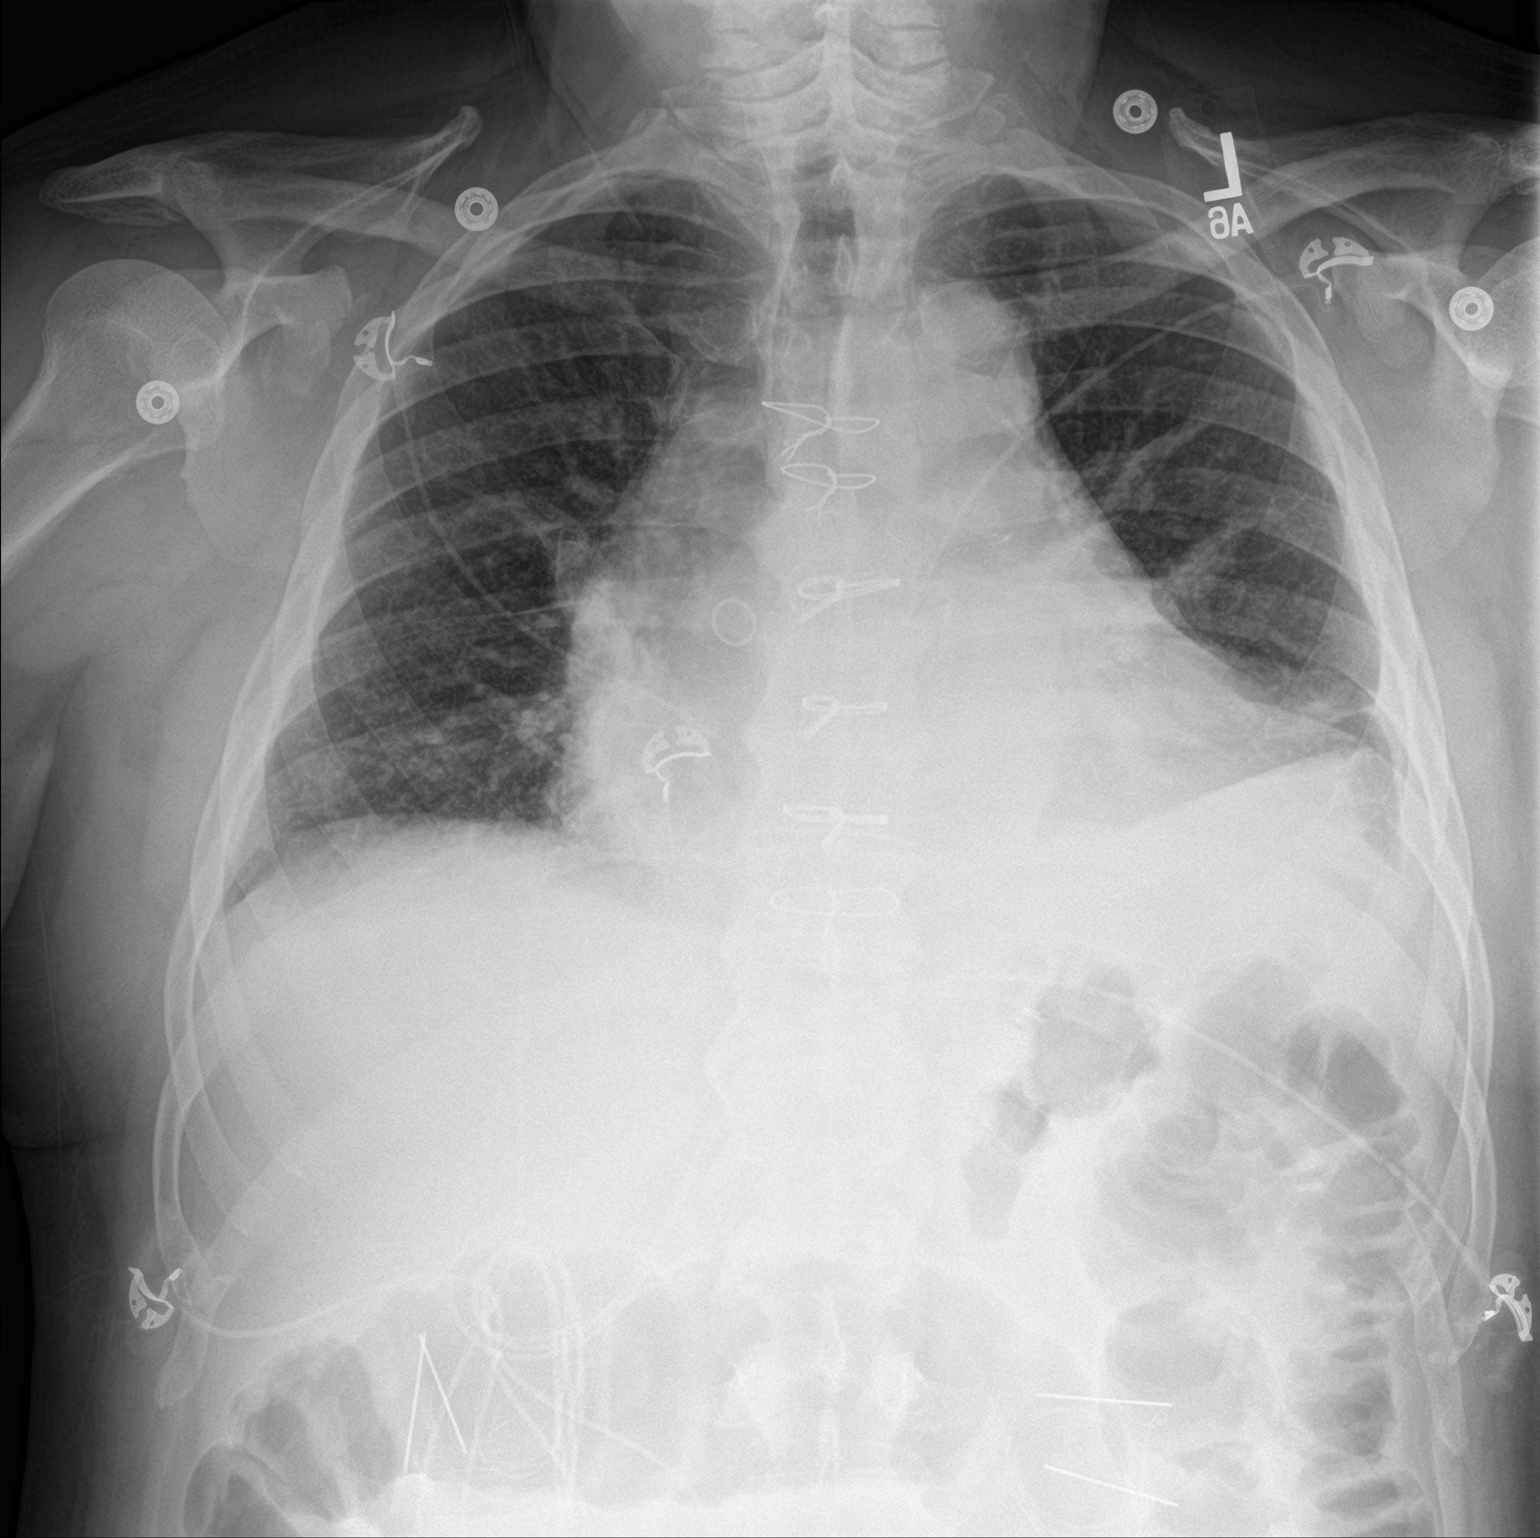

[chest lat]
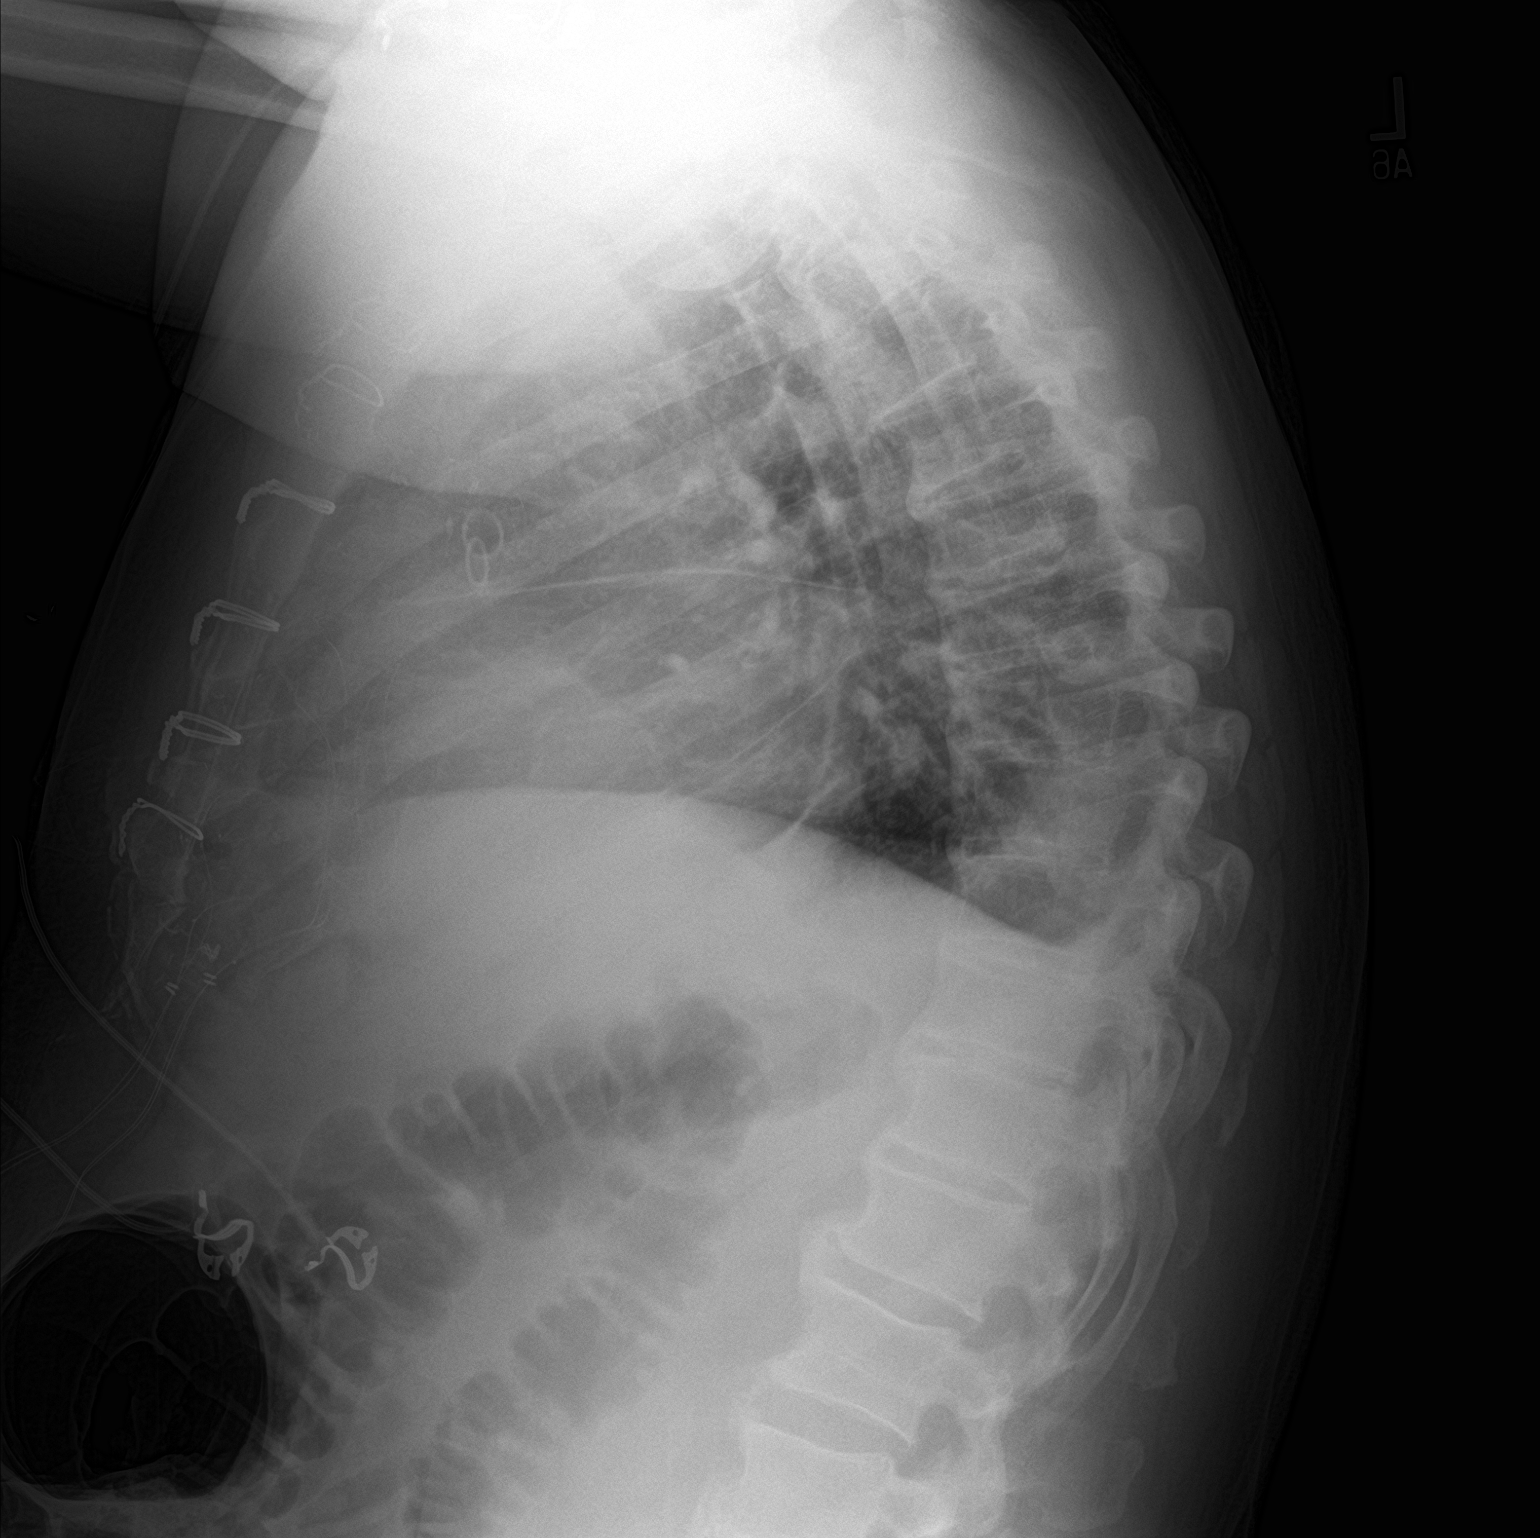

[2 of 2 positions shown; findings below may reference images not displayed]

FINDINGS: Interval removal of right IJ sheath. Prior CABG. Cardiomegaly with
normal pulmonary vascularity. Mild bibasilar atelectasis. Small left
pleural effusion. No pneumothorax.
IMPRESSION: 1. Interim removal of right IJ sheath.

2.  Prior CABG.  Stable cardiomegaly.

3. Bibasilar subsegmental atelectasis.

## 2017-10-30 HISTORY — PX: LEFT HEART CATH AND CORS/GRAFTS ANGIOGRAPHY: CATH118250

## 2017-11-02 ENCOUNTER — Telehealth: Payer: Self-pay | Admitting: Cardiology

## 2017-11-02 NOTE — Telephone Encounter (Signed)
°  New message  Pt verbalized that he is calling for RN  Pt stated that he had a heart attack 10-29-2017 and he   was released from the hospital on 11-01-2017. [facility] [place]

## 2017-11-02 NOTE — Telephone Encounter (Signed)
Spoke with patient of Dr. Herbie BaltimoreHarding who had an [place] in [place]. He had not stenting. He states the 2 small vein grafts of his CABG had collapsed and could not be opened but but his other bypass grafts were OK and he had good collaterals. He was advised to call us to schedule an appointment early this week, as he is needing clearance to return to work. Per Jasmine DecemberSharon, RN OK to use a DOD slot on Monday March 4.

## 2017-11-05 ENCOUNTER — Encounter: Payer: Self-pay | Admitting: Cardiology

## 2017-11-05 ENCOUNTER — Ambulatory Visit (INDEPENDENT_AMBULATORY_CARE_PROVIDER_SITE_OTHER): Payer: BLUE CROSS/BLUE SHIELD | Admitting: Cardiology

## 2017-11-05 VITALS — BP 109/73 | HR 64 | Ht 68.0 in | Wt 233.0 lb

## 2017-11-05 DIAGNOSIS — E785 Hyperlipidemia, unspecified: Secondary | ICD-10-CM | POA: Diagnosis not present

## 2017-11-05 DIAGNOSIS — I255 Ischemic cardiomyopathy: Secondary | ICD-10-CM

## 2017-11-05 DIAGNOSIS — E8881 Metabolic syndrome: Secondary | ICD-10-CM | POA: Diagnosis not present

## 2017-11-05 DIAGNOSIS — I10 Essential (primary) hypertension: Secondary | ICD-10-CM | POA: Diagnosis not present

## 2017-11-05 DIAGNOSIS — I2129 ST elevation (STEMI) myocardial infarction involving other sites: Secondary | ICD-10-CM | POA: Diagnosis not present

## 2017-11-05 DIAGNOSIS — I25110 Atherosclerotic heart disease of native coronary artery with unstable angina pectoris: Secondary | ICD-10-CM

## 2017-11-05 DIAGNOSIS — Z720 Tobacco use: Secondary | ICD-10-CM

## 2017-11-05 NOTE — Progress Notes (Addendum)
PCP: Junie Spencer, FNP  Clinic Note: Chief Complaint  Patient presents with  . Hospitalization Follow-up    Pt states no Sx. following hospitalization for non-ST elevation MI  . Coronary Artery Disease    CABG -now with early graft failure.    HPI: Parker Sellers is a 57 y.o. male with a PMH below who presents today for Post MI-CABG follow-up.  Sept 2018: STE-ACS - aborted STEMI, with MV CAD noted on urgent Cardiac Cath --> referred for CABG.  CABG x5: LIMA-LAD, SeqfRIMA-OM1-OM2, SeqSVG-PD-PL  Parker Sellers was last seen on September 10, 2017- doing well.  Gained weight over Christmas. Noted that he had quit smoking.  No angina symptoms  Recent Hospitalizations:   Patient was recently admitted to [facility] in [place] for MI. --By report, 2 vein grafts occluded, could not be open.  Other grafts patent with good collaterals.  Studies Personally Reviewed - (if available, images/films reviewed: From Epic Chart or Care Everywhere)  Transthoracic Echo September 24, 2017: Mild LV dilation with mild increased thickness (mild LVH).  Low-normal function with a EF of 50-55%.  No regional wall motion normalities noted.  Cardiac Cath - [facility], [place] : LIMA-LAD patent, graft to OM-OM patent (in chart listed as SVG but this is actually the free RIMA), occluded SVG-RPDA-RPL --> unable to wire the occluded graft (the attempt was made to wire the RIMA that had been disconnected and connected to the aorta.  Native RCA is totally closed.  EF estimated 40%  Transthoracic Echo - [facility], [place]: LVEF estimated at 50 and 55%.  No obvious regional wall motion normality.  Technically difficult study.  Interval History: Parker Sellers presents here today for hospital follow-up after his recurrent MI with occluded vein grafts.  Parker Sellers tells me that he really actually has been doing fine ever since the last hospital stay.  No further chest pain or  pressure with rest or exertion.  He has had no further anginal symptoms.  He denies any heart failure symptoms of PND, orthopnea or edema.  He has no rapid irregular heartbeats palpitations.  No syncope/near syncope or TIA/amaurosis fugax.  He has been trying to exercise more and has been watching his diet ever since Christmas is over.  He is happy to see that he is lost some weight.  ROS: A comprehensive was performed. Pertinent symptoms noted above otherwise: Review of Systems  Constitutional: Negative for malaise/fatigue and weight loss.  HENT: Negative for congestion and nosebleeds.   Respiratory: Positive for cough (Minimal morning cough now). Negative for shortness of breath and wheezing.   Gastrointestinal: Negative for abdominal pain, blood in stool, constipation, heartburn and melena.  Genitourinary: Negative for hematuria.  Musculoskeletal: Negative for joint pain.  Neurological: Negative for dizziness (episode noted in history of present illness) and focal weakness.  Endo/Heme/Allergies: Does not bruise/bleed easily.  Psychiatric/Behavioral: Negative for memory loss. The patient is not nervous/anxious and does not have insomnia.   All other systems reviewed and are negative.  I have reviewed and (if needed) personally updated the patient's problem list, medications, allergies, past medical and surgical history, social and family history.   Past Medical History:  Diagnosis Date  . Coronary artery disease involving native coronary artery of native heart with unstable angina pectoris (HCC) 05/22/2017   Cath 05/12/2017 (aborted inferior STEMI) - Ost LM 55%, d (apical) LAD 100%, D1 100% (chronic), Om1 70%, mRCA 75% ulcerated lesion (culprit) - dRCA  55%, ostrPDA 60% & rPDA 60%. RPAV 70%, PAV2 95%. Difficult Radial Access - tortuous innominate artery.;; -->  Status post recurrent MI with 2 vein graft occlusion  . Hyperlipidemia   . Hypertension   . Ischemic cardiomyopathy 05/12/2017   EF  by LV Gram 35-45%; Post MI Echo 45%, OR TEE 35-45%.  Gr 2 DD. -->  Initially resolved as of September 24, 2017.  EF 50-55%.  . S/P CABG x 5 05/14/2017   LIMA-LAD, seqSVG-OM1-OM2, free r-Rad-rPDA  . Sleep apnea    home CPAP  . ST elevation myocardial infarction (STEMI) of lateral wall, initial episode of care [facility]) 05/12/2017   Recurrent MI -February 2019 [facility] [place])  . Tobacco use 05/12/2017    Past Surgical History:  Procedure Laterality Date  . CORONARY ARTERY BYPASS GRAFT N/A 05/14/2017   Procedure: CORONARY ARTERY BYPASS GRAFTING (CABG)/EVH x 5 five using bilateral internal mammary arteries and left saphenous vein.;  Surgeon: Loreli Slot, MD;  Location: [facility] OR;  Service: Open Heart Surgery(::) LIMA-LAD, seqSVG-OM1-OM2, free r-Rad-rPDA  . HERNIA REPAIR    . KNEE SURGERY Left    Torn meniscus  . LEFT HEART CATH AND CORONARY ANGIOGRAPHY N/A 05/12/2017   Procedure: LEFT HEART CATH AND CORONARY ANGIOGRAPHY;  Surgeon: Marykay Lex, MD;  Location: [facility] INVASIVE CV LAB: MV CAD - ostLM 55%, dLAD 100%, D1 100%, OM 1 70%, mRCA 75%-dRCA 55%, ostPDA 60% - PDA 60^, PL1 70%, PK2 95%.  Mod-Severe LV Dysfunction - ~EF 35-45%  . LEFT HEART CATH AND CORS/GRAFTS ANGIOGRAPHY     [facility] [place], [place]:   . TEE WITHOUT CARDIOVERSION N/A 05/14/2017   Procedure: TRANSESOPHAGEAL ECHOCARDIOGRAM (TEE);  Surgeon: Loreli Slot, MD;  Location: [facility] OR;  Service: Open Heart Surgery: Mod Concentric LVH. EF ~35-40%.  Inferior hypokinesis & inferolateral hypokinesis. Dilated MV & TV annuli with only trace MR/TR.  Mild RV dilation with moderately reduced function.  . TRANSTHORACIC ECHOCARDIOGRAM  05/13/2017   Mod LVH. Mildly reduced LVED ~45-50%. Akinesis of inferolateral & inferoseptal walls. Gr 2 DD. Mild Ascending Aortic dilation ~3.8 cm.    LEFT HEART CATH AND CORONARY ANGIOGRAPHY: Presented as ~ STEMI 05/12/2017   Wall Motion       Diagnostic Diagram         Current Meds  Medication  Sig  . aspirin EC 81 MG tablet Take 1 tablet (81 mg total) by mouth daily.  Marland Kitchen atorvastatin (LIPITOR) 40 MG tablet Take 1 tablet (40 mg total) by mouth daily.  . clopidogrel (PLAVIX) 75 MG tablet Take 1 tablet (75 mg total) by mouth daily.  Marland Kitchen lisinopril-hydrochlorothiazide (ZESTORETIC) 20-12.5 MG tablet Take 2 tablets by mouth daily.  . Multiple Vitamin (MULTIVITAMIN WITH MINERALS) TABS tablet Take 1 tablet by mouth daily.    No Known Allergies  Social History   Tobacco Use  . Smoking status: Former Games developer  . Smokeless tobacco: Current User    Types: Snuff, Chew  Substance Use Topics  . Alcohol use: Yes    Alcohol/week: 1.2 oz    Types: 2 Cans of beer per week    Comment: quit as of sept 2018  . Drug use: No   Social History   Social History Narrative  . Not on file    Family History  family history includes AAA (abdominal aortic aneurysm) in his father; Alcohol abuse in his father; Cancer in his mother; Coronary artery disease in his brother; Hypertension in his father; Liver disease in his  father.  Wt Readings from Last 3 Encounters:  11/05/17 233 lb (105.7 kg)  10/01/17 239 lb 6.4 oz (108.6 kg)  09/10/17 241 lb (109.3 kg)  - lost 1/2 of wgt back.    PHYSICAL EXAM BP 109/73   Pulse 64   Ht 5\' 8"  (1.727 m)   Wt 233 lb (105.7 kg)   BMI 35.43 kg/m  Physical Exam  Constitutional: He is oriented to person, place, and time. He appears well-developed and well-nourished. No distress.  Moderately obese. Well groomed. He does appear to be covering quite well from his hospitalization.   HENT:  Head: Normocephalic and atraumatic.  Eyes: Conjunctivae and EOM are normal. Pupils are equal, round, and reactive to light.  Neck: Normal range of motion. Neck supple. No hepatojugular reflux and no JVD (Very difficult to see because of body habitus) present. Carotid bruit is not present.  Cardiovascular: Normal rate, regular rhythm, S1 normal, S2 normal, intact distal pulses and  normal pulses.  Occasional extrasystoles are present. PMI is not displaced (Unable to palpate). Exam reveals distant heart sounds. Exam reveals no gallop and no friction rub.  No murmur heard. Pulmonary/Chest: Effort normal and breath sounds normal. No respiratory distress. He has no wheezes. He has no rales.  Well-healed sternotomy scar. He still has his 16 place in the 3 chest tube sites. Healing well.  Abdominal: Soft. Bowel sounds are normal. He exhibits no distension. There is no tenderness. There is no rebound.  Musculoskeletal: Normal range of motion. He exhibits no edema (Maybe trivial).  Neurological: He is alert and oriented to person, place, and time. No cranial nerve deficit.  Skin: Skin is warm and dry.  Psychiatric: He has a normal mood and affect. His behavior is normal. Judgment and thought content normal.  Nursing note and vitals reviewed.   Adult ECG Report Sinus rhythm rate 64 bpm.  Left atrial enlargement.  Incomplete right bundle branch block.  Inferior T waves as well as septal T wave inversion cannot exclude ischemia.  Other studies Reviewed: Additional studies/ records that were reviewed today include:  Recent Labs:    Lab Results  Component Value Date   CHOL 104 09/03/2017   HDL 26 (L) 09/03/2017   LDLCALC 50 09/03/2017   TRIG 140 09/03/2017   CHOLHDL 4.0 09/03/2017   Lab Results  Component Value Date   HGBA1C 5.2 05/12/2017   Lab Results  Component Value Date   CREATININE 1.35 (H) 10/01/2017   BUN 17 10/01/2017   NA 140 10/01/2017   K 4.5 10/01/2017   CL 102 10/01/2017   CO2 23 10/01/2017    ASSESSMENT / PLAN: No no Problem List Items Addressed This Visit    Tobacco use (Chronic)    Doing well with smoking cessation.  No recurrence.  I congratulated him on his efforts.      ST elevation myocardial infarction (STEMI) of lateral wall, initial episode of care [facility](HCC) (Chronic)    Now post aborted STEMI followed by a non-STEMI.  Doing well after both  of them with preserved EF.  No active heart failure or angina symptoms on stable medications.      Relevant Orders   EKG 12-Lead (Completed)   Comprehensive metabolic panel   Morbid obesity (HCC)    He is now back on the track for losing weight.  Trying to stay active.  Some of the weight gain was after smoking cessation.      Metabolic syndrome   Relevant Orders  Lipid panel   Comprehensive metabolic panel   Ischemic cardiomyopathy (Chronic)   Relevant Orders   EKG 12-Lead (Completed)   Comprehensive metabolic panel   Hyperlipidemia with target low density lipoprotein (LDL) cholesterol less than 70 mg/dL (Chronic)    Excellent results with lipids on statin.  He is now down to 40 mg of Lipitor.  He will be due for follow-up labs in July timeframe.      Relevant Orders   Lipid panel   Comprehensive metabolic panel   Essential hypertension (Chronic)    Blood pressure looks good on current medications.  No need to change.      Coronary artery disease involving native coronary artery of native heart with unstable angina pectoris (HCC) - Primary (Chronic)    Multivessel disease with urgent CABG in the setting of an MI now having had a second non-ST elevation MI where there is graft closure.  Despite having had a follow-up MI with graft closure, his EF is still preserved. He amazingly, is now stable with no recurrent anginal symptoms.  We will simply continue to push on with aggressive medical management.  He is on aspirin and Plavix for post ACS.  He is on a beta-blocker and statin.      Relevant Orders   EKG 12-Lead (Completed)       Current medicines are reviewed at length with the patient today. (+/- concerns) n/a The following changes have been made: n/a  Patient Instructions  MEDICATION INSTRUCTIONS   NO CHANGES     MAY RETURN TO WORK MARCH 11,2019  -LIGHT DUTY - NO MORE THAN 20 LBS LIFTING FOR 2 WEEKS  STARTING MARCH 25, FULL DUTY WITH NO  RESTRICTIONS.     LABS  June - July 2019  CMP LIPID DO NO EAT OR DRINK THE MORNING . WILL MAIL YOU LABSLIP.   Your physician wants you to follow-up in July 2019 WITH DR [facility]. You will receive a reminder letter in the mail two months in advance. If you don't receive a letter, please call our office to schedule the follow-up appointment.     Studies Ordered:   Orders Placed This Encounter  Procedures  . Lipid panel  . Comprehensive metabolic panel  . EKG 12-Lead      Bryan Lemma, M.D., M.S. Interventional Cardiologist   Pager # [phone] Phone # [phone] [address] [place], [place] [address]

## 2017-11-05 NOTE — Patient Instructions (Signed)
MEDICATION INSTRUCTIONS   NO CHANGES     MAY RETURN TO WORK MARCH 11,2019  -LIGHT DUTY - NO MORE THAN 20 LBS LIFTING FOR 2 WEEKS  STARTING MARCH 25, FULL DUTY WITH NO RESTRICTIONS.     LABS  June - July 2019  CMP LIPID DO NO EAT OR DRINK THE MORNING . WILL MAIL YOU LABSLIP.   Your physician wants you to follow-up in July 2019 [facility]. You will receive a reminder letter in the mail two months in advance. If you don't receive a letter, please call our office to schedule the follow-up appointment.

## 2017-11-05 NOTE — Telephone Encounter (Signed)
RESULT GIVEN AT OFFICE APPOINTMENT 11/05/17

## 2017-11-12 ENCOUNTER — Encounter: Payer: Self-pay | Admitting: Cardiology

## 2017-11-12 NOTE — Assessment & Plan Note (Signed)
Now post aborted STEMI followed by a non-STEMI.  Doing well after both of them with preserved EF.  No active heart failure or angina symptoms on stable medications.

## 2017-11-12 NOTE — Assessment & Plan Note (Signed)
Multivessel disease with urgent CABG in the setting of an MI now having had a second non-ST elevation MI where there is graft closure.  Despite having had a follow-up MI with graft closure, his EF is still preserved. He amazingly, is now stable with no recurrent anginal symptoms.  We will simply continue to push on with aggressive medical management.  He is on aspirin and Plavix for post ACS.  He is on a beta-blocker and statin.

## 2017-11-12 NOTE — Assessment & Plan Note (Signed)
Blood pressure looks good on current medications.  No need to change.

## 2017-11-12 NOTE — Assessment & Plan Note (Signed)
He is now back on the track for losing weight.  Trying to stay active.  Some of the weight gain was after smoking cessation.

## 2017-11-12 NOTE — Assessment & Plan Note (Signed)
Doing well with smoking cessation.  No recurrence.  I congratulated him on his efforts.

## 2017-11-12 NOTE — Assessment & Plan Note (Addendum)
Excellent results with lipids on statin.  He is now down to 40 mg of Lipitor.  He will be due for follow-up labs in July timeframe.

## 2017-11-13 ENCOUNTER — Encounter: Payer: Self-pay | Admitting: Cardiology

## 2017-11-16 ENCOUNTER — Ambulatory Visit: Payer: BLUE CROSS/BLUE SHIELD | Admitting: Cardiology

## 2017-11-25 IMAGING — CR DG CHEST 2V
2 series · 2 of 2 positions shown · non-contrast
Comparison: 05/17/2017 .

CLINICAL DATA: CABG.

EXAM:
CHEST  2 VIEW

[w chest pa]
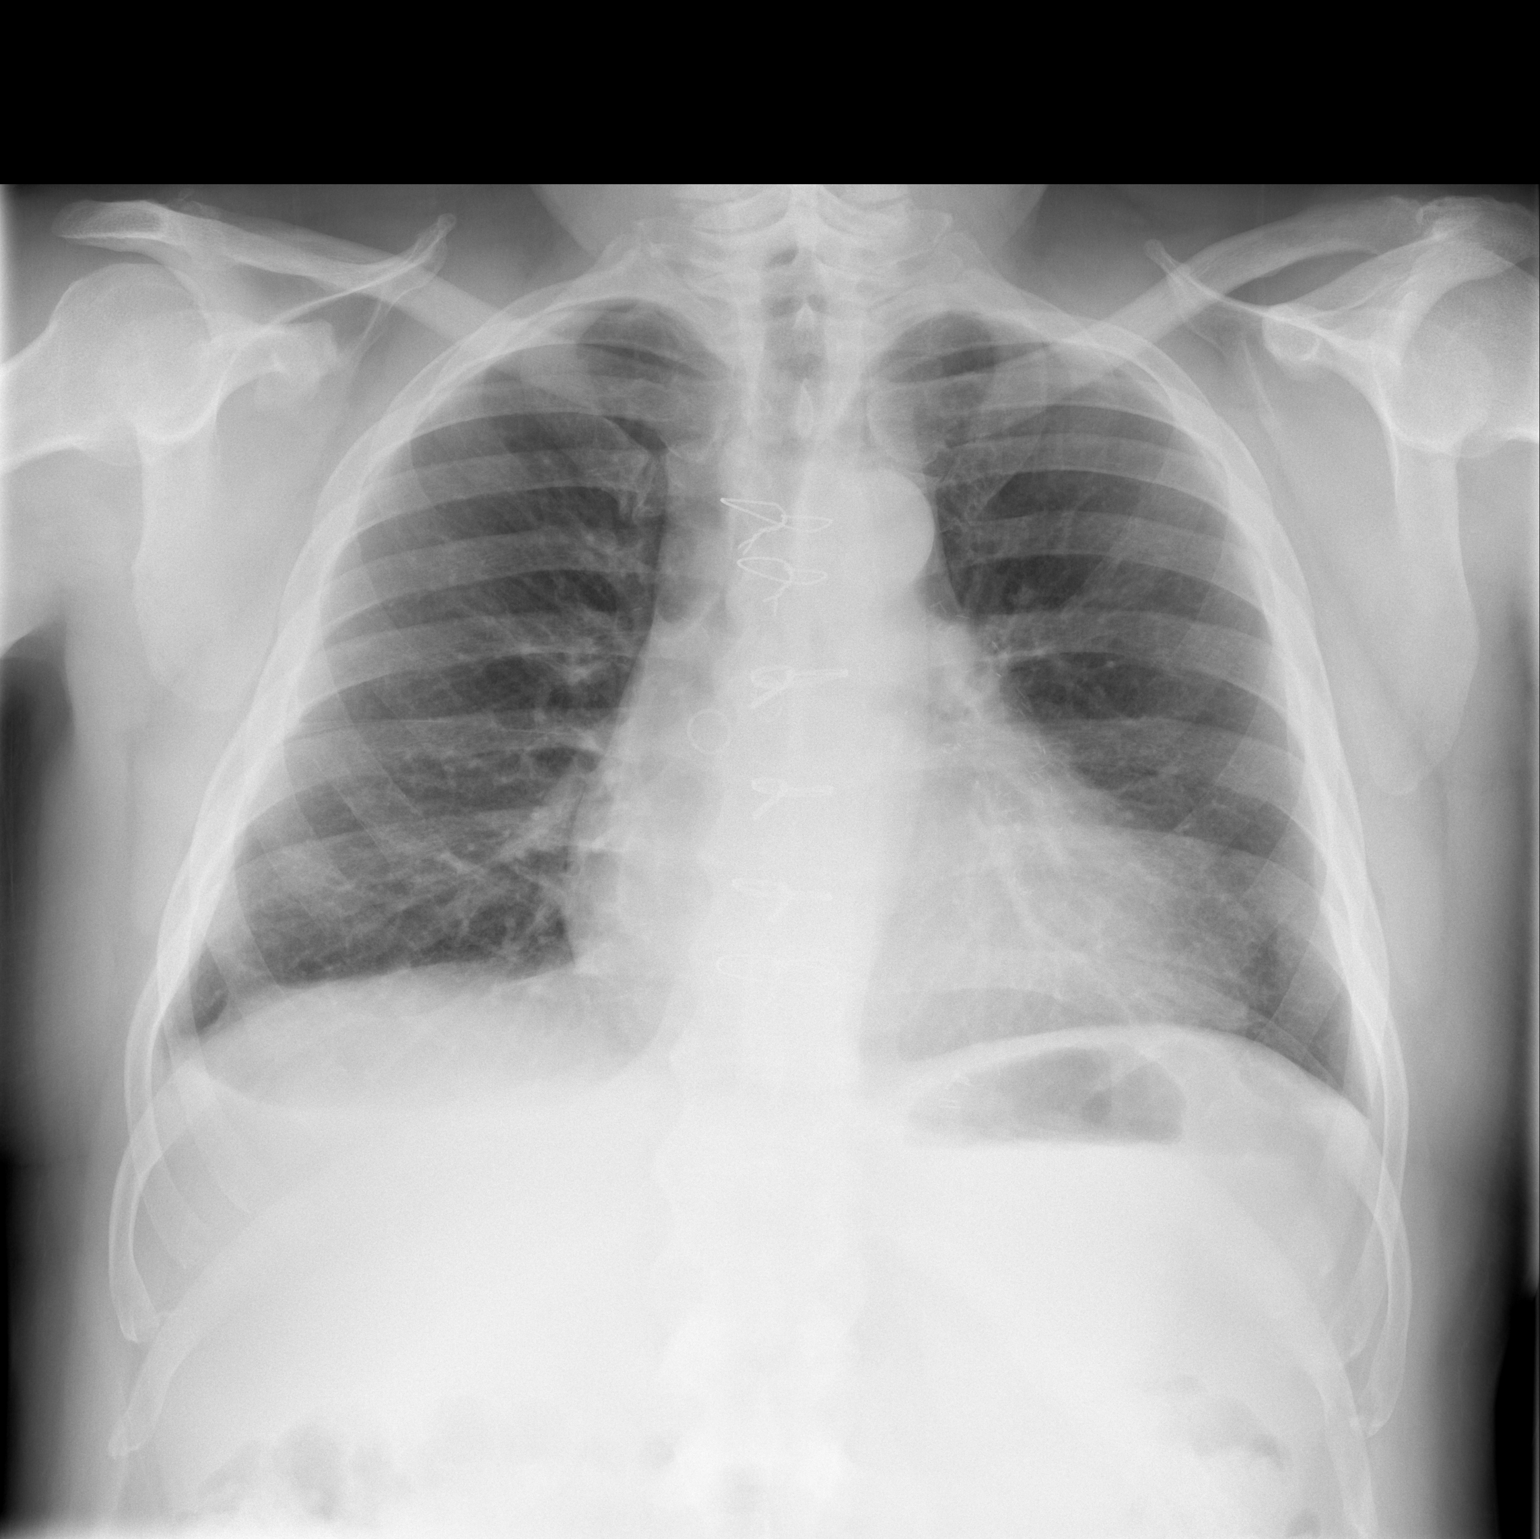

[w chest lat]
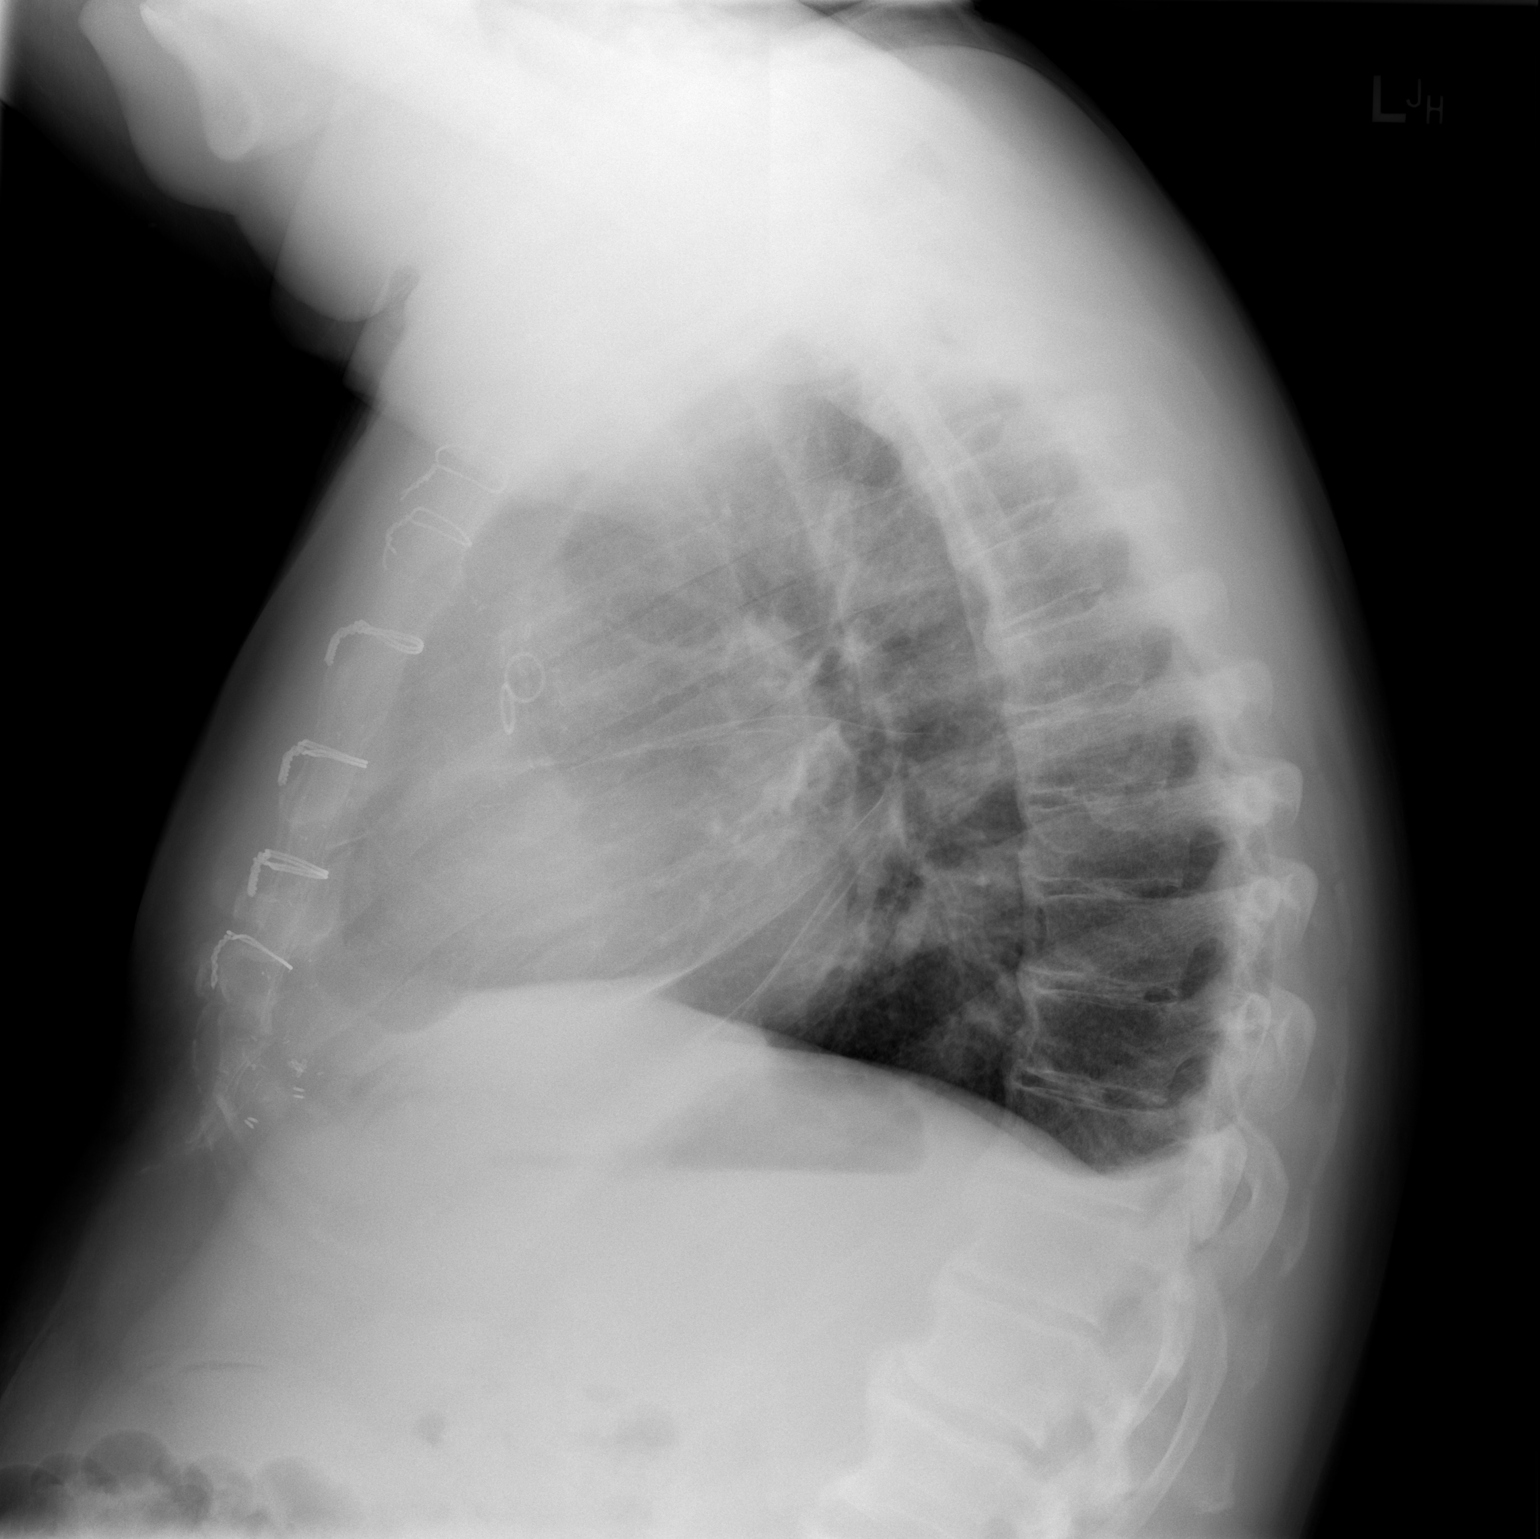

[2 of 2 positions shown; findings below may reference images not displayed]

FINDINGS: Prior CABG. Stable cardiomegaly. Normal pulmonary vascularity. No
pulmonary venous congestion. Interim improvement of bibasilar
atelectasis. Interim clearing scratched it interim partial clearing
of left pleural effusion. Small right pleural effusion.
IMPRESSION: 1. Prior CABG. Stable cardiomegaly. No pulmonary venous congestion.

2. Improved aeration in the lung bases. Interim near complete
clearing of left pleural effusion. Small right pleural effusion .

## 2017-12-18 ENCOUNTER — Telehealth: Payer: Self-pay | Admitting: *Deleted

## 2017-12-18 NOTE — Telephone Encounter (Signed)
Labs are  Not due now , patient  Came in later and labs were change.  Will mail letter June 2019

## 2017-12-18 NOTE — Telephone Encounter (Signed)
-----   Message from Tobin ChadSharon Liset Mcmonigle V, RN sent at 09/10/2017  8:52 AM EST ----- DUE 01/08/18 @ 12/10/17 MAIL LABSLIP - CMP ,LIPID

## 2018-01-07 ENCOUNTER — Ambulatory Visit: Payer: BLUE CROSS/BLUE SHIELD | Admitting: Cardiology

## 2018-02-05 ENCOUNTER — Telehealth: Payer: Self-pay | Admitting: *Deleted

## 2018-02-05 DIAGNOSIS — I2129 ST elevation (STEMI) myocardial infarction involving other sites: Secondary | ICD-10-CM

## 2018-02-05 DIAGNOSIS — E8881 Metabolic syndrome: Secondary | ICD-10-CM

## 2018-02-05 DIAGNOSIS — I255 Ischemic cardiomyopathy: Secondary | ICD-10-CM

## 2018-02-05 DIAGNOSIS — E785 Hyperlipidemia, unspecified: Secondary | ICD-10-CM

## 2018-02-05 NOTE — Telephone Encounter (Signed)
-----   Message from Tobin ChadSharon Martin V, RN sent at 11/08/2017 12:34 PM EST ----- LABS IN July 4,2019 CMP LIPID  [email or web address]  4,2019

## 2018-02-05 NOTE — Telephone Encounter (Signed)
Mail letter and labslip  

## 2018-02-11 ENCOUNTER — Encounter: Payer: Self-pay | Admitting: Family

## 2018-02-11 ENCOUNTER — Ambulatory Visit: Payer: BLUE CROSS/BLUE SHIELD | Admitting: Family

## 2018-02-11 VITALS — BP 132/83 | HR 54 | Temp 97.9°F | Ht 68.0 in | Wt 238.8 lb

## 2018-02-11 DIAGNOSIS — I25110 Atherosclerotic heart disease of native coronary artery with unstable angina pectoris: Secondary | ICD-10-CM

## 2018-02-11 DIAGNOSIS — E8881 Metabolic syndrome: Secondary | ICD-10-CM | POA: Diagnosis not present

## 2018-02-11 DIAGNOSIS — I10 Essential (primary) hypertension: Secondary | ICD-10-CM

## 2018-02-11 DIAGNOSIS — Z720 Tobacco use: Secondary | ICD-10-CM | POA: Diagnosis not present

## 2018-02-11 DIAGNOSIS — E785 Hyperlipidemia, unspecified: Secondary | ICD-10-CM

## 2018-02-11 DIAGNOSIS — Z951 Presence of aortocoronary bypass graft: Secondary | ICD-10-CM

## 2018-02-11 LAB — CMP14+EGFR
ALT: 21 IU/L (ref 0–44)
AST: 16 IU/L (ref 0–40)
Albumin/Globulin Ratio: 1.7 (ref 1.2–2.2)
Albumin: 4.5 g/dL (ref 3.5–5.5)
Alkaline Phosphatase: 85 IU/L (ref 39–117)
BUN/Creatinine Ratio: 13 (ref 9–20)
BUN: 20 mg/dL (ref 6–24)
Bilirubin Total: 0.4 mg/dL (ref 0.0–1.2)
CO2: 23 mmol/L (ref 20–29)
Calcium: 9.7 mg/dL (ref 8.7–10.2)
Chloride: 103 mmol/L (ref 96–106)
Creatinine, Ser: 1.49 mg/dL — ABNORMAL HIGH (ref 0.76–1.27)
GFR calc Af Amer: 59 mL/min/{1.73_m2} — ABNORMAL LOW (ref >59)
GFR calc non Af Amer: 51 mL/min/{1.73_m2} — ABNORMAL LOW (ref >59)
Globulin, Total: 2.7 g/dL (ref 1.5–4.5)
Glucose: 102 mg/dL — ABNORMAL HIGH (ref 65–99)
Potassium: 4.8 mmol/L (ref 3.5–5.2)
Sodium: 141 mmol/L (ref 134–144)
Total Protein: 7.2 g/dL (ref 6.0–8.5)

## 2018-02-11 LAB — LIPID PANEL
Chol/HDL Ratio: 4.1 ratio (ref 0.0–5.0)
Cholesterol, Total: 116 mg/dL (ref 100–199)
HDL: 28 mg/dL — ABNORMAL LOW (ref >39)
LDL Calculated: 54 mg/dL (ref 0–99)
Triglycerides: 168 mg/dL — ABNORMAL HIGH (ref 0–149)
VLDL Cholesterol Cal: 34 mg/dL (ref 5–40)

## 2018-02-11 NOTE — Patient Instructions (Signed)

## 2018-02-11 NOTE — Progress Notes (Signed)
Subjective:    Patient ID: Parker Sellers, male    DOB: [birth year], 57 y.o.   MRN: [id number]  Chief Complaint  Patient presents with  . Hypertension    four month recheck   PT presents to the office today for chronic follow up. PT is followed by Cardiologists every 4 months after his MI on 05/12/17.  Hypertension  This is a chronic problem. The current episode started more than 1 year ago. The problem has been resolved since onset. The problem is controlled. Pertinent negatives include no headaches, malaise/fatigue, peripheral edema or shortness of breath. Risk factors for coronary artery disease include diabetes mellitus, dyslipidemia, obesity and male gender. The current treatment provides moderate improvement. Hypertensive end-organ damage includes CAD/MI. There is no history of kidney disease or CVA.  Hyperlipidemia  This is a chronic problem. The current episode started more than 1 year ago. The problem is controlled. Recent lipid tests were reviewed and are normal. Exacerbating diseases include obesity. Pertinent negatives include no shortness of breath. Current antihyperlipidemic treatment includes statins. The current treatment provides moderate improvement of lipids. Risk factors for coronary artery disease include dyslipidemia, diabetes mellitus, male sex, hypertension, obesity and a sedentary lifestyle.  CAD & Metabolic Syndrome  PT takes Plavix and Lipitor  daily. States he has an active job, but does exercise regularly. Eats what he cans as he travels for his job weekly.      Review of Systems  Constitutional: Negative.  Negative for malaise/fatigue.  HENT: Negative.   Respiratory: Negative.  Negative for shortness of breath.   Cardiovascular: Negative.   Gastrointestinal: Negative.   Endocrine: Negative.   Genitourinary: Negative.   Musculoskeletal: Negative.   Neurological: Negative.  Negative for headaches.  Hematological: Negative.   Psychiatric/Behavioral:  Negative.   All other systems reviewed and are negative.      Objective:   Physical Exam  Constitutional: He is oriented to person, place, and time. He appears well-developed and well-nourished. No distress.  HENT:  Head: Normocephalic.  Right Ear: External ear normal.  Left Ear: External ear normal.  Mouth/Throat: Oropharynx is clear and moist.  Eyes: Pupils are equal, round, and reactive to light. Right eye exhibits no discharge. Left eye exhibits no discharge.  Neck: Normal range of motion. Neck supple. No thyromegaly present.  Cardiovascular: Normal rate, regular rhythm, normal heart sounds and intact distal pulses.  No murmur heard. Pulmonary/Chest: Effort normal and breath sounds normal. No respiratory distress. He has no wheezes.  Abdominal: Soft. Bowel sounds are normal. He exhibits no distension. There is no tenderness.  Musculoskeletal: Normal range of motion. He exhibits no edema or tenderness.  Neurological: He is alert and oriented to person, place, and time. He has normal reflexes. No cranial nerve deficit.  Skin: Skin is warm and dry. No rash noted. No erythema.  Psychiatric: He has a normal mood and affect. His behavior is normal. Judgment and thought content normal.  Vitals reviewed.     BP 132/83   Pulse (!) 54   Temp 97.9 F (36.6 C) (Oral)   Ht '5\' 8"'$  (1.727 m)   Wt 238 lb 12.8 oz (108.3 kg)   BMI 36.31 kg/m      Assessment & Plan:  Parker Sellers comes in today with chief complaint of Hypertension (four month recheck)   Diagnosis and orders addressed:  1. Essential hypertension - CMP14+EGFR  2. Coronary artery disease involving native coronary artery of native heart with unstable angina pectoris ([place]) -  CMP14+EGFR  3. Hyperlipidemia with target low density lipoprotein (LDL) cholesterol less than 70 mg/dL - CMP14+EGFR - Lipid panel  4. Morbid obesity ([place]) - CMP14+EGFR  5. S/P CABG x 5 - LKH57+MBBU  6. Metabolic syndrome -  YZJ09+UKRC  7. Tobacco use - CMP14+EGFR   Labs pending Health Maintenance reviewed- Pt scheduled colonoscopy  Diet and exercise encouraged  Follow up plan: 4 months    Evelina Dun, FNP

## 2018-02-12 ENCOUNTER — Telehealth: Payer: Self-pay | Admitting: Family

## 2018-02-12 ENCOUNTER — Other Ambulatory Visit: Payer: Self-pay | Admitting: Family

## 2018-02-12 DIAGNOSIS — R7989 Other specified abnormal findings of blood chemistry: Secondary | ICD-10-CM

## 2018-05-05 ENCOUNTER — Other Ambulatory Visit: Payer: Self-pay | Admitting: Family

## 2018-05-05 DIAGNOSIS — I10 Essential (primary) hypertension: Secondary | ICD-10-CM

## 2018-05-13 ENCOUNTER — Encounter: Payer: Self-pay | Admitting: Cardiology

## 2018-05-13 ENCOUNTER — Ambulatory Visit: Payer: BLUE CROSS/BLUE SHIELD | Admitting: Cardiology

## 2018-05-13 VITALS — BP 118/76 | HR 49 | Wt 237.4 lb

## 2018-05-13 DIAGNOSIS — I255 Ischemic cardiomyopathy: Secondary | ICD-10-CM

## 2018-05-13 DIAGNOSIS — I2510 Atherosclerotic heart disease of native coronary artery without angina pectoris: Secondary | ICD-10-CM

## 2018-05-13 DIAGNOSIS — I10 Essential (primary) hypertension: Secondary | ICD-10-CM | POA: Diagnosis not present

## 2018-05-13 DIAGNOSIS — E785 Hyperlipidemia, unspecified: Secondary | ICD-10-CM | POA: Diagnosis not present

## 2018-05-13 DIAGNOSIS — Z951 Presence of aortocoronary bypass graft: Secondary | ICD-10-CM

## 2018-05-13 NOTE — Patient Instructions (Addendum)
NO MEDICATION CHANGES   OK TO PROCEED WITH COLONOSCOPY.  MAY HOLD  ASPIRIN AND CLOPIDOGREL  5 DAYS PRIOR TO PROCEDURE.  Your physician wants you to follow-up in Feb 2020 with Dr Herbie Baltimore.You will receive a reminder letter in the mail two months in advance. If you don't receive a letter, please call our office to schedule the follow-up appointment.   If you need a refill on your cardiac medications before your next appointment, please call your pharmacy.

## 2018-05-13 NOTE — Progress Notes (Signed)
PCP: Parker Spencer, FNP  Clinic Note: Chief Complaint  Patient presents with  . Follow-up    Stable without symptoms  . Coronary Artery Disease    HPI: Parker Sellers is a 57 y.o. male with a PMH below who presents today for Post MI-CABG follow-up.  Sept 2018: STE-ACS - aborted STEMI, with MV CAD noted on urgent Cardiac Cath --> referred for CABG.  CABG x5: LIMA-LAD, SeqfRIMA-OM1-OM2, SeqSVG-PD-PL  Parker Sellers was last seen on September 10, 2017- doing well.  Gained weight over Christmas. Noted that he had quit smoking.  No angina symptoms  Recent Hospitalizations:   No hospitalizations  Studies Personally Reviewed - (if available, images/films reviewed: From Epic Chart or Care Everywhere)  No recent studies.  Interval History: Parker Sellers presents here today still stable overall from a cardiac standpoint.  He says he has good days and bad days when he comes to his diet.  He is therefore having a difficult time maintaining steady weight.  He is however trying to stay active both at work but also trying to walk more.  He really denies any anginal chest tightness or pressure with rest or exertion and denies any significant exertional dyspnea besides deconditioning and obesity and underlying lung disease.  No PND, orthopnea trivial edema. He denies any rapid irregular heartbeats or palpitations.  No syncope/near syncope or TIA/amaurosis fugax. He is trying to do some walking at least 6 days a week.  He initially had lipids checked in June to look great and then in July they were checked by his employer and the LDL.  They have gone up from 54-70. His PCP is asking about timing for colonoscopy.   ROS: A comprehensive was performed. Pertinent symptoms noted above otherwise: Review of Systems  Constitutional: Negative for malaise/fatigue and weight loss.  HENT: Negative for congestion and nosebleeds.   Respiratory: Positive for cough (Minimal morning cough). Negative for  shortness of breath and wheezing.   Gastrointestinal: Negative for abdominal pain, blood in stool, constipation, heartburn and melena.  Genitourinary: Negative for hematuria.  Musculoskeletal: Negative for joint pain.  Neurological: Negative for dizziness and focal weakness.  Endo/Heme/Allergies: Does not bruise/bleed easily.  Psychiatric/Behavioral: Negative for memory loss. The patient is not nervous/anxious and does not have insomnia.   All other systems reviewed and are negative.  I have reviewed and (if needed) personally updated the patient's problem list, medications, allergies, past medical and surgical history, social and family history.   Past Medical History:  Diagnosis Date  . Coronary artery disease involving native coronary artery of native heart with unstable angina pectoris (HCC) 05/22/2017   Cath 05/12/2017 (aborted inferior STEMI) - Ost LM 55%, d (apical) LAD 100%, D1 100% (chronic), Om1 70%, mRCA 75% ulcerated lesion (culprit) - dRCA 55%, ostrPDA 60% & rPDA 60%. RPAV 70%, PAV2 95%. Difficult Radial Access - tortuous innominate artery.;; -->  Status post recurrent MI with occlusion of graft to the right system with negative RCA occlusion  . Hyperlipidemia   . Hypertension   . Ischemic cardiomyopathy 05/12/2017   EF by LV Gram 35-45%; Post MI Echo 45%, OR TEE 35-45%.  Gr 2 DD. -->  Initially resolved as of September 24, 2017.  EF 50-55%.  . S/P CABG x 5 05/14/2017   LIMA-LAD, seqSVG-OM1-OM2, free r-Rad-rPDA  . Sleep apnea    home CPAP  . ST elevation myocardial infarction (STEMI) of lateral wall, initial episode of care [facility]) 05/12/2017   Recurrent MI -February 2019 (  [place]) -native RCA occluded along with vein graft to RPDA-RPL unable to wire.  . Tobacco use 05/12/2017    Past Surgical History:  Procedure Laterality Date  . CORONARY ARTERY BYPASS GRAFT N/A 05/14/2017   Procedure: CORONARY ARTERY BYPASS GRAFTING (CABG)/EVH x 5 five using bilateral internal mammary  arteries and left saphenous vein.;  Surgeon: Parker Slot, MD;  Location: [facility] OR;  Service: Open Heart Surgery(::) LIMA-LAD, seqSVG-OM1-OM2, free r-Rad-rPDA  . HERNIA REPAIR    . KNEE SURGERY Left    Torn meniscus  . LEFT HEART CATH AND CORONARY ANGIOGRAPHY N/A 05/12/2017   Procedure: LEFT HEART CATH AND CORONARY ANGIOGRAPHY;  Surgeon: Parker Lex, MD;  Location: [facility] INVASIVE CV LAB: MV CAD - ostLM 55%, dLAD 100%, D1 100%, OM 1 70%, mRCA 75%-dRCA 55%, ostPDA 60% - PDA 60^, PL1 70%, PK2 95%.  Mod-Severe LV Dysfunction - ~EF 35-45%  . LEFT HEART CATH AND CORS/GRAFTS ANGIOGRAPHY  10/30/2017   [facility] [place] : LIMA-LAD patent, graft to OM-OM patent (in chart listed as SVG but this is actually the free RIMA), occluded SVG-RPDA-RPL -->  Native RCA is totally closed.  EF 40%.  ->  Attempt intervention on the RIMA  as it comes off the innominate artery was obviously unsuccessful as the vessel have been removed and attached to the aorta.  . TEE WITHOUT CARDIOVERSION N/A 05/14/2017   Procedure: TRANSESOPHAGEAL ECHOCARDIOGRAM (TEE);  Surgeon: Parker Slot, MD;  Location: [facility] OR;  Service: Open Heart Surgery: Mod Concentric LVH. EF ~35-40%.  Inferior hypokinesis & inferolateral hypokinesis. Dilated MV & TV annuli with only trace MR/TR.  Mild RV dilation with moderately reduced function.  . TRANSTHORACIC ECHOCARDIOGRAM  05/13/2017   Mod LVH. Mildly reduced LVED ~45-50%. Akinesis of inferolateral & inferoseptal walls. Gr 2 DD. Mild Ascending Aortic dilation ~3.8 cm.  . TRANSTHORACIC ECHOCARDIOGRAM  09/24/2017   Mild LV dilation with mild increased thickness (mild LVH).  Low-normal function with a EF of 50-55%.  No regional wall motion normalities noted. -->  Echocardiogram done in [facility] in [place] also showed EF 50 to 50% with no obvious wall motion abnormality, technically difficult study.    LEFT HEART CATH AND CORONARY ANGIOGRAPHY: Presented as ~  STEMI 05/12/2017   Diagnostic Diagram --> pre-CABG. -->  Most recent cath shows occluded RCA with occluded graft to the RCA.       Current Meds  Medication Sig  . aspirin EC 81 MG tablet Take 1 tablet (81 mg total) by mouth daily.  Marland Kitchen atorvastatin (LIPITOR) 40 MG tablet Take 1 tablet (40 mg total) by mouth daily.  . clopidogrel (PLAVIX) 75 MG tablet Take 1 tablet (75 mg total) by mouth daily.  Marland Kitchen lisinopril-hydrochlorothiazide (PRINZIDE,ZESTORETIC) 20-12.5 MG tablet TAKE 2 TABLETS BY MOUTH ONCE DAILY  . metoprolol tartrate (LOPRESSOR) 25 MG tablet Take 1.5 tablets (37.5 mg total) 2 (two) times daily by mouth.  . Multiple Vitamin (MULTIVITAMIN WITH MINERALS) TABS tablet Take 1 tablet by mouth daily.    No Known Allergies  Social History   Tobacco Use  . Smoking status: Former Games developer  . Smokeless tobacco: Current User    Types: Snuff, Chew  Substance Use Topics  . Alcohol use: Yes    Alcohol/week: 2.0 standard drinks    Types: 2 Cans of beer per week    Comment: quit as of sept 2018  . Drug use: No    Types: Marijuana   Social History  Social History Narrative  . Not on file    Family History  family history includes AAA (abdominal aortic aneurysm) in his father; Alcohol abuse in his father; Cancer in his mother; Coronary artery disease in his brother; Hypertension in his father; Liver disease in his father.  Wt Readings from Last 3 Encounters:  05/13/18 237 lb 6.4 oz (107.7 kg)  02/11/18 238 lb 12.8 oz (108.3 kg)  11/05/17 233 lb (105.7 kg)   PHYSICAL EXAM BP 118/76   Pulse (!) 49   Wt 237 lb 6.4 oz (107.7 kg)   BMI 36.10 kg/m  Physical Exam  Constitutional: He is oriented to person, place, and time. He appears well-developed and well-nourished. No distress.  Moderately obese. Well groomed.   HENT:  Head: Normocephalic and atraumatic.  Neck: Normal range of motion. Neck supple. No hepatojugular reflux and no JVD (Very difficult to see because of body habitus)  present. Carotid bruit is not present.  Cardiovascular: Normal rate, regular rhythm, S1 normal, S2 normal, intact distal pulses and normal pulses.  Occasional extrasystoles are present. PMI is not displaced (Unable to palpate). Exam reveals distant heart sounds. Exam reveals no gallop and no friction rub.  No murmur heard. Pulmonary/Chest: Effort normal and breath sounds normal. No respiratory distress. He has no wheezes. He has no rales.  Well-healed sternotomy scar. He still has his 16 place in the 3 chest tube sites. Healing well.  Abdominal: Soft. Bowel sounds are normal. He exhibits no distension. There is no tenderness. There is no rebound.  Musculoskeletal: Normal range of motion. He exhibits no edema (Maybe trivial).  Neurological: He is alert and oriented to person, place, and time. No cranial nerve deficit.  Psychiatric: He has a normal mood and affect. His behavior is normal. Judgment and thought content normal.  Nursing note and vitals reviewed.   Adult ECG Report Sinus rhythm rate 64 bpm.  Left atrial enlargement.  Incomplete right bundle branch block.  Inferior T waves as well as septal T wave inversion cannot exclude ischemia.  Other studies Reviewed: Additional studies/ records that were reviewed today include:  Recent Labs:    Lab Results  Component Value Date   CHOL 116 02/11/2018   HDL 28 (L) 02/11/2018   LDLCALC 54 02/11/2018   TRIG 168 (H) 02/11/2018   CHOLHDL 4.1 02/11/2018   --Outpatient labs from work showed TC 135, TG 228, LDL 70, HDL 33.  Lab Results  Component Value Date   HGBA1C 5.2 05/12/2017   Lab Results  Component Value Date   CREATININE 1.49 (H) 02/11/2018   BUN 20 02/11/2018   NA 141 02/11/2018   K 4.8 02/11/2018   CL 103 02/11/2018   CO2 23 02/11/2018   -From work, creatinine 1.24.   ASSESSMENT / PLAN: No no Problem List Items Addressed This Visit    Coronary artery disease involving native coronary artery of native heart without angina  pectoris - Primary (Chronic)    Found to have multivessel disease in the setting of STEMI.  Followed by non-STEMI after CABG and noted to have acute graft closure of the free RIMA to the RPDA and PL.  Despite having graft closure, he still has preserved EF just slightly low normal.  No recurrent anginal symptoms or heart failure symptoms.  Edema well controlled.  Continues to be on aspirin and Plavix for secondary prevention.  At this point however he would be okay to stop aspirin and continue Plavix.  Okay to hold Plavix 5  days preprocedure if necessary.  No further ischemic evaluation needed for colonoscopy.      Relevant Orders   EKG 12-Lead   Essential hypertension (Chronic)    Well-controlled on current meds.  No change      Hyperlipidemia with target low density lipoprotein (LDL) cholesterol less than 70 mg/dL (Chronic)    Initial labs from June appear to be much better than from July.  This may have some to do with his going back on his poor diet.  He is working on getting back to his more healthy diet.   Would reassess in about 6 months.  If still going up, would need to be more aggressive with atorvastatin, versus converting to Crestor plus/minus Zetia.      Ischemic cardiomyopathy (Chronic)    Essentially resolved.  Echo up to baseline EF of 50 to 55% post CABG.      Relevant Orders   EKG 12-Lead   Morbid obesity (HCC) (Chronic)    Unfortunately, with his falling off the diet wagon he is gained back some of the weight.  With risk factors and BMI of 36 still considered morbid obesity, but had lost weight postop.  Open to get back on his diet and increase exercise to lose more weight.      S/P CABG x 5 (Chronic)       Current medicines are reviewed at length with the patient today. (+/- concerns) n/a The following changes have been made: n/a  Patient Instructions  NO MEDICATION CHANGES   OK TO PROCEED WITH COLONOSCOPY.  MAY HOLD  ASPIRIN AND CLOPIDOGREL  5 DAYS  PRIOR TO PROCEDURE.  Your physician wants you to follow-up in Feb 2020 with Dr Herbie Baltimore.You will receive a reminder letter in the mail two months in advance. If you don't receive a letter, please call our office to schedule the follow-up appointment.   If you need a refill on your cardiac medications before your next appointment, please call your pharmacy.     Studies Ordered:   Orders Placed This Encounter  Procedures  . EKG 12-Lead      Bryan Lemma, M.D., M.S. Interventional Cardiologist   Pager # [phone] Phone # [phone] [address] [place], [place] [address]

## 2018-05-14 ENCOUNTER — Encounter: Payer: Self-pay | Admitting: Cardiology

## 2018-05-14 NOTE — Assessment & Plan Note (Signed)
Unfortunately, with his falling off the diet wagon he is gained back some of the weight.  With risk factors and BMI of 36 still considered morbid obesity, but had lost weight postop.  Open to get back on his diet and increase exercise to lose more weight.

## 2018-05-14 NOTE — Assessment & Plan Note (Addendum)
Found to have multivessel disease in the setting of STEMI.  Followed by non-STEMI after CABG and noted to have acute graft closure of the free RIMA to the RPDA and PL.  Despite having graft closure, he still has preserved EF just slightly low normal.  No recurrent anginal symptoms or heart failure symptoms.  Edema well controlled.  Continues to be on aspirin and Plavix for secondary prevention.  At this point however he would be okay to stop aspirin and continue Plavix.  Okay to hold Plavix 5 days preprocedure if necessary.  No further ischemic evaluation needed for colonoscopy.

## 2018-05-14 NOTE — Assessment & Plan Note (Signed)
Initial labs from June appear to be much better than from July.  This may have some to do with his going back on his poor diet.  He is working on getting back to his more healthy diet.   Would reassess in about 6 months.  If still going up, would need to be more aggressive with atorvastatin, versus converting to Crestor plus/minus Zetia.

## 2018-05-14 NOTE — Assessment & Plan Note (Signed)
Essentially resolved.  Echo up to baseline EF of 50 to 55% post CABG.

## 2018-05-14 NOTE — Assessment & Plan Note (Signed)
Well-controlled on current meds.  No change 

## 2018-06-17 ENCOUNTER — Ambulatory Visit: Payer: BLUE CROSS/BLUE SHIELD | Admitting: Family

## 2018-06-21 ENCOUNTER — Other Ambulatory Visit: Payer: Self-pay | Admitting: Cardiology

## 2018-06-24 ENCOUNTER — Ambulatory Visit: Payer: BLUE CROSS/BLUE SHIELD | Admitting: Family

## 2018-06-24 ENCOUNTER — Encounter: Payer: Self-pay | Admitting: Family

## 2018-06-24 VITALS — BP 129/84 | HR 54 | Temp 97.5°F | Ht 68.0 in | Wt 243.2 lb

## 2018-06-24 DIAGNOSIS — Z1211 Encounter for screening for malignant neoplasm of colon: Secondary | ICD-10-CM

## 2018-06-24 DIAGNOSIS — E8881 Metabolic syndrome: Secondary | ICD-10-CM

## 2018-06-24 DIAGNOSIS — Z951 Presence of aortocoronary bypass graft: Secondary | ICD-10-CM

## 2018-06-24 DIAGNOSIS — Z23 Encounter for immunization: Secondary | ICD-10-CM | POA: Diagnosis not present

## 2018-06-24 DIAGNOSIS — I10 Essential (primary) hypertension: Secondary | ICD-10-CM

## 2018-06-24 DIAGNOSIS — Z720 Tobacco use: Secondary | ICD-10-CM

## 2018-06-24 DIAGNOSIS — E785 Hyperlipidemia, unspecified: Secondary | ICD-10-CM | POA: Diagnosis not present

## 2018-06-24 LAB — CMP14+EGFR
ALT: 19 IU/L (ref 0–44)
AST: 16 IU/L (ref 0–40)
Albumin/Globulin Ratio: 1.5 (ref 1.2–2.2)
Albumin: 4.3 g/dL (ref 3.5–5.5)
Alkaline Phosphatase: 83 IU/L (ref 39–117)
BUN/Creatinine Ratio: 14 (ref 9–20)
BUN: 20 mg/dL (ref 6–24)
Bilirubin Total: 0.3 mg/dL (ref 0.0–1.2)
CO2: 23 mmol/L (ref 20–29)
Calcium: 9.6 mg/dL (ref 8.7–10.2)
Chloride: 101 mmol/L (ref 96–106)
Creatinine, Ser: 1.42 mg/dL — ABNORMAL HIGH (ref 0.76–1.27)
GFR calc Af Amer: 62 mL/min/{1.73_m2} (ref >59)
GFR calc non Af Amer: 54 mL/min/{1.73_m2} — ABNORMAL LOW (ref >59)
Globulin, Total: 2.8 g/dL (ref 1.5–4.5)
Glucose: 105 mg/dL — ABNORMAL HIGH (ref 65–99)
Potassium: 4.3 mmol/L (ref 3.5–5.2)
Sodium: 139 mmol/L (ref 134–144)
Total Protein: 7.1 g/dL (ref 6.0–8.5)

## 2018-06-24 NOTE — Progress Notes (Signed)
   Subjective:    Patient ID: Parker Sellers, male    DOB: [birth year], 57 y.o.   MRN: [id number]  Chief Complaint  Patient presents with  . Medical Management of Chronic Issues   PT presents to the office today for chronic follow up. PT is followed by Cardiologists every 6 months after his MI on 05/12/17. Hypertension  This is a chronic problem. The current episode started more than 1 year ago. The problem has been resolved since onset. The problem is controlled. Pertinent negatives include no headaches, malaise/fatigue, peripheral edema or shortness of breath. Risk factors for coronary artery disease include dyslipidemia, obesity, male gender and smoking/tobacco exposure. The current treatment provides moderate improvement. Hypertensive end-organ damage includes CAD/MI.  Hyperlipidemia  This is a chronic problem. The current episode started more than 1 year ago. The problem is controlled. Recent lipid tests were reviewed and are normal. Exacerbating diseases include obesity. Pertinent negatives include no shortness of breath. Current antihyperlipidemic treatment includes statins. The current treatment provides moderate improvement of lipids. Risk factors for coronary artery disease include dyslipidemia, male sex, hypertension and a sedentary lifestyle.  Metabolic Syndrome Pt states he is active, but does not watch what he eats. He does take his Lipitor daily.   Review of Systems  Constitutional: Negative for malaise/fatigue.  Respiratory: Negative for shortness of breath.   Neurological: Negative for headaches.  All other systems reviewed and are negative.      Objective:   Physical Exam  Constitutional: He is oriented to person, place, and time. He appears well-developed and well-nourished. No distress.  HENT:  Head: Normocephalic.  Right Ear: External ear normal.  Left Ear: External ear normal.  Mouth/Throat: Oropharynx is clear and moist.  Eyes: Pupils are equal, round, and  reactive to light. Right eye exhibits no discharge. Left eye exhibits no discharge.  Neck: Normal range of motion. Neck supple. No thyromegaly present.  Cardiovascular: Normal rate, regular rhythm, normal heart sounds and intact distal pulses.  No murmur heard. Pulmonary/Chest: Effort normal and breath sounds normal. No respiratory distress. He has no wheezes.  Abdominal: Soft. Bowel sounds are normal. He exhibits no distension. There is no tenderness.  Musculoskeletal: Normal range of motion. He exhibits no edema or tenderness.  Neurological: He is alert and oriented to person, place, and time. He has normal reflexes. No cranial nerve deficit.  Skin: Skin is warm and dry. No rash noted. No erythema.  Psychiatric: He has a normal mood and affect. His behavior is normal. Judgment and thought content normal.  Vitals reviewed.     BP 129/84   Pulse (!) 54   Temp (!) 97.5 F (36.4 C) (Oral)   Ht _0  (1.727 m)   Wt 243 lb 3.2 oz (110.3 kg)   BMI 36.98 kg/m      Assessment & Plan:  Parker Sellers comes in today with chief complaint of Medical Management of Chronic Issues   Diagnosis and orders addressed:  1. Essential hypertension - CMP14+EGFR  2. Morbid obesity ([place]) - CMP14+EGFR  3. Hyperlipidemia with target low density lipoprotein (LDL) cholesterol less than 70 mg/dL - MPN36+RWER  4. Metabolic syndrome - XVQ00+QQPY  5. S/P CABG x 5 - CMP14+EGFR  6. Tobacco use - CMP14+EGFR  7. Colon cancer screening - Ambulatory referral to Gastroenterology - CMP14+EGFR   Labs pending Health Maintenance reviewed Diet and exercise encouraged  Follow up plan: 6 months and keep Cardiologists appt   Parker Dun, FNP

## 2018-06-24 NOTE — Patient Instructions (Signed)

## 2018-07-02 ENCOUNTER — Encounter: Payer: Self-pay | Admitting: Internal Medicine

## 2018-07-23 ENCOUNTER — Ambulatory Visit: Payer: BLUE CROSS/BLUE SHIELD

## 2018-09-09 ENCOUNTER — Ambulatory Visit (INDEPENDENT_AMBULATORY_CARE_PROVIDER_SITE_OTHER): Payer: Self-pay

## 2018-09-09 DIAGNOSIS — Z1211 Encounter for screening for malignant neoplasm of colon: Secondary | ICD-10-CM

## 2018-09-09 MED ORDER — NA SULFATE-K SULFATE-MG SULF 17.5-3.13-1.6 GM/177ML PO SOLN: 1.0000 | ORAL | 0 refills | Status: DC

## 2018-09-09 NOTE — Patient Instructions (Addendum)
Parker Sellers   [birth year] MRN: [id number]    Procedure Date: 11/15/18 Time to register: 11:00am Place to register: [facility] Procedure Time: 12:00pm Scheduled provider: Barney Drain, MD  PREPARATION FOR COLONOSCOPY WITH TRI-LYTE SPLIT PREP  Please notify us immediately if you are diabetic, take iron supplements, or if you are on Coumadin or any other blood thinners.   You do not need to hold your Plavix or Aspirin.   You will need to purchase 1 fleet enema and 1 box of Bisacodyl 43m tablets. You can purchase these over the counter at your pharmacy.    1 DAY BEFORE PROCEDURE:  DATE: 11/14/18   DAY: Thursday  clear liquids the entire day - NO SOLID FOOD.   At 2:00 pm:  Take 2 Bisacodyl tablets.   At 4:00pm:  Start drinking your solution. Make sure you mix well per instructions on the bottle. Try to drink 1 (one) 8 ounce glass every 10-15 minutes until you have consumed HALF the jug. You should complete by 6:00pm.You must keep the left over solution refrigerated until completed next day.  Continue clear liquids. You must drink plenty of clear liquids to prevent dehyration and kidney failure.     DAY OF PROCEDURE:   DATE: 11/15/18   DAY: Friday If you take medications for your heart, blood pressure or breathing, you may take these medications.   Five hours before your procedure time @ 7:00am:  Finish remaining amout of bowel prep, drinking 1 (one) 8 ounce glass every 10-15 minutes until complete. You have two hours to consume remaining prep.   Three hours before your procedure time _0 :00am:  Nothing by mouth.   At least one hour before going to the hospital:  Give yourself one Fleet enema.  You may take your morning medications with sip of water unless we have instructed otherwise.      Please see below for Dietary Information.  CLEAR LIQUIDS INCLUDE:  Water Jello (NOT red in color)   Ice Popsicles (NOT red in color)   Tea (sugar ok, no milk/cream) Powdered  fruit flavored drinks  Coffee (sugar ok, no milk/cream) Gatorade/ Lemonade/ Kool-Aid  (NOT red in color)   Juice: apple, white grape, white cranberry Soft drinks  Clear bullion, consomme, broth (fat free beef/chicken/vegetable)  Carbonated beverages (any kind)  Strained chicken noodle soup Hard Candy   Remember: Clear liquids are liquids that will allow you to see your fingers on the other side of a clear glass. Be sure liquids are NOT red in color, and not cloudy, but CLEAR.  DO NOT EAT OR DRINK ANY OF THE FOLLOWING:  Dairy products of any kind   Cranberry juice Tomato juice / V8 juice   Grapefruit juice Orange juice     Red grape juice  Do not eat any solid foods, including such foods as: cereal, oatmeal, yogurt, fruits, vegetables, creamed soups, eggs, bread, crackers, pureed foods in a blender, etc.   HELPFUL HINTS FOR DRINKING PREP SOLUTION:   Make sure prep is extremely cold. Mix and refrigerate the the morning of the prep. You may also put in the freezer.   You may try mixing some Crystal Light or Country Time Lemonade if you prefer. Mix in small amounts; add more if necessary.  Try drinking through a straw  Rinse mouth with water or a mouthwash between glasses, to remove after-taste.  Try sipping on a cold beverage /ice/ popsicles between glasses of prep.  Place a piece of  sugar-free hard candy in mouth between glasses.  If you become nauseated, try consuming smaller amounts, or stretch out the time between glasses. Stop for 30-60 minutes, then slowly start back drinking.        OTHER INSTRUCTIONS  You will need a responsible adult at least 59 years of age to accompany you and drive you home. This person must remain in the waiting room during your procedure. The hospital will cancel your procedure if you do not have a responsible adult with you.   1. Wear loose fitting clothing that is easily removed. 2. Leave jewelry and other valuables at home.  3. Remove all  body piercing jewelry and leave at home. 4. Total time from sign-in until discharge is approximately 2-3 hours. 5. You should go home directly after your procedure and rest. You can resume normal activities the day after your procedure. 6. The day of your procedure you should not:  Drive  Make legal decisions  Operate machinery  Drink alcohol  Return to work   You may call the office (Dept: [phone]) before 5:00pm, or page the doctor on call [phone]) after 5:00pm, for further instructions, if necessary.   Insurance Information YOU WILL NEED TO CHECK WITH YOUR INSURANCE COMPANY FOR THE BENEFITS OF COVERAGE YOU HAVE FOR THIS PROCEDURE.  UNFORTUNATELY, NOT ALL INSURANCE COMPANIES HAVE BENEFITS TO COVER ALL OR PART OF THESE TYPES OF PROCEDURES.  IT IS YOUR RESPONSIBILITY TO CHECK YOUR BENEFITS, HOWEVER, WE WILL BE GLAD TO ASSIST YOU WITH ANY CODES YOUR INSURANCE COMPANY MAY NEED.    PLEASE NOTE THAT MOST INSURANCE COMPANIES WILL NOT COVER A SCREENING COLONOSCOPY FOR PEOPLE UNDER THE AGE OF 50  IF YOU HAVE BCBS INSURANCE, YOU MAY HAVE BENEFITS FOR A SCREENING COLONOSCOPY BUT IF POLYPS ARE FOUND THE DIAGNOSIS WILL CHANGE AND THEN YOU MAY HAVE A DEDUCTIBLE THAT WILL NEED TO BE MET. SO PLEASE MAKE SURE YOU CHECK YOUR BENEFITS FOR A SCREENING COLONOSCOPY AS WELL AS A DIAGNOSTIC COLONOSCOPY.

## 2018-09-09 NOTE — Progress Notes (Signed)
Gastroenterology Pre-Procedure Review  Request Date:09/09/18 Requesting Physician: Jannifer Rodney NP- [facility]- no previous tcs  PATIENT REVIEW QUESTIONS: The patient responded to the following health history questions as indicated:    Pt had bypass surgery in  Sept- 2018, he is on plavix and ASA. Per last cardiology OV note in 05/2018-  Dr.Harding said it was ok to hold plavix and ASA for 5 days prior to procedure if needed.   1. Diabetes Melitis: no 2. Joint replacements in the past 12 months: no 3. Major health problems in the past 3 months: no 4. Has an artificial valve or MVP: no 5. Has a defibrillator: no 6. Has been advised in past to take antibiotics in advance of a procedure like teeth cleaning: no 7. Family history of colon cancer: no  8. Alcohol Use: no 9. History of sleep apnea: yes (cpap)  10. History of coronary artery or other vascular stents placed within the last 12 months: no 11. History of any prior anesthesia complications: no    MEDICATIONS & ALLERGIES:    Patient reports the following regarding taking any blood thinners:   Plavix? yes (75mg ) Aspirin? yes (81mg ) Coumadin? no Brilinta? no Xarelto? no Eliquis? no Pradaxa? no Savaysa? no Effient? no  Patient confirms/reports the following medications:  Current Outpatient Medications  Medication Sig Dispense Refill  . aspirin EC 81 MG tablet Take 1 tablet (81 mg total) by mouth daily. 90 tablet 3  . atorvastatin (LIPITOR) 40 MG tablet Take 40 mg by mouth daily.    . clopidogrel (PLAVIX) 75 MG tablet TAKE 1 TABLET BY MOUTH ONCE DAILY 90 tablet 1  . lisinopril-hydrochlorothiazide (PRINZIDE,ZESTORETIC) 20-12.5 MG tablet TAKE 2 TABLETS BY MOUTH ONCE DAILY 180 tablet 1  . metoprolol tartrate (LOPRESSOR) 25 MG tablet Take 25 mg by mouth 2 (two) times daily. Takes 37.5 bid    . Multiple Vitamin (MULTIVITAMIN WITH MINERALS) TABS tablet Take 1 tablet by mouth daily.     No current  facility-administered medications for this visit.     Patient confirms/reports the following allergies:  No Known Allergies  No orders of the defined types were placed in this encounter.   AUTHORIZATION INFORMATION Primary Insurance: [place],  [place] #: [id number] Pre-Cert / Berkley Harvey required:no   SCHEDULE INFORMATION: Procedure has been scheduled as follows:  Date: 11/15/18, Time: 12:00 Location: [facility] Dr[facility]Fields  This Gastroenterology Pre-Precedure Review Form is being routed to the following provider(s): Tana Coast PA

## 2018-09-10 NOTE — Progress Notes (Addendum)
Ok to schedule. No need to hold Plavix or ASA.

## 2018-09-10 NOTE — Progress Notes (Signed)
Letter mailed to the pt about ASA/plavix.

## 2018-09-10 NOTE — Addendum Note (Signed)
Addended by: Myra Rude on: 09/10/2018 10:22 AM   Modules accepted: Orders, SmartSet

## 2018-09-12 ENCOUNTER — Telehealth: Payer: Self-pay | Admitting: Internal Medicine

## 2018-09-12 MED ORDER — PEG 3350-KCL-NA BICARB-NACL 420 G PO SOLR: 4000.0000 mL | ORAL | 0 refills | Status: DC

## 2018-09-12 NOTE — Telephone Encounter (Signed)
[phone]  Patient called said the prep that was called in will cost him 100$ and he needs something less expensive.

## 2018-09-12 NOTE — Addendum Note (Signed)
Addended by: Myra RudeLAWSON, Victor Langenbach H on: 09/12/2018 02:32 PM   Modules accepted: Orders

## 2018-09-12 NOTE — Telephone Encounter (Signed)
trilyte rx sent to the pharmacy and new instructions mailed to the pt.

## 2018-09-12 NOTE — Progress Notes (Signed)
Pt called- see phone note- unable to afford suprep. rx sent in for trilyte and new instructions mailed to the pt.

## 2018-09-22 ENCOUNTER — Other Ambulatory Visit: Payer: Self-pay | Admitting: Cardiology

## 2018-10-14 ENCOUNTER — Ambulatory Visit: Payer: BLUE CROSS/BLUE SHIELD | Admitting: Cardiology

## 2018-10-21 ENCOUNTER — Ambulatory Visit: Payer: BLUE CROSS/BLUE SHIELD | Admitting: Cardiology

## 2018-10-21 ENCOUNTER — Encounter: Payer: Self-pay | Admitting: Cardiology

## 2018-10-21 VITALS — BP 136/82 | HR 61 | Ht 68.0 in | Wt 243.6 lb

## 2018-10-21 DIAGNOSIS — E785 Hyperlipidemia, unspecified: Secondary | ICD-10-CM

## 2018-10-21 DIAGNOSIS — I10 Essential (primary) hypertension: Secondary | ICD-10-CM | POA: Diagnosis not present

## 2018-10-21 DIAGNOSIS — I2510 Atherosclerotic heart disease of native coronary artery without angina pectoris: Secondary | ICD-10-CM

## 2018-10-21 DIAGNOSIS — I2129 ST elevation (STEMI) myocardial infarction involving other sites: Secondary | ICD-10-CM

## 2018-10-21 DIAGNOSIS — I255 Ischemic cardiomyopathy: Secondary | ICD-10-CM | POA: Diagnosis not present

## 2018-10-21 DIAGNOSIS — Z951 Presence of aortocoronary bypass graft: Secondary | ICD-10-CM

## 2018-10-21 NOTE — Patient Instructions (Signed)
Medication Instructions:  NOT NEEDED  WHEN YOU HAVE COLONOSCOPY MAY HOLD CLOPIDOGREL ( PLAVIX) 5 TO 7 DAYS PRIOR TO  PROCEDURE   DISCUSS WITH  GI DOCTOR.  If you need a refill on your cardiac medications before your next appointment, please call your pharmacy.   Lab work:  LIPID  CMP FOR June 2020- WILL MAIL LABSLIP IN MAY 2020 If you have labs (blood work) drawn today and your tests are completely normal, you will receive your results only by: Marland Kitchen MyChart Message (if you have MyChart) OR . A paper copy in the mail If you have any lab test that is abnormal or we need to change your treatment, we will call you to review the results.  Testing/Procedures: NOT NEEDED  Follow-Up: At [facility], you and your health needs are our priority.  As part of our continuing mission to provide you with exceptional heart care, we have created designated Provider Care Teams.  These Care Teams include your primary Cardiologist (physician) and Advanced Practice Providers (APPs -  Physician Assistants and Nurse Practitioners) who all work together to provide you with the care you need, when you need it. You will need a follow up appointment in 6 months AUG 2020.  Please call our office 2 months in advance to schedule this appointment.  You may see Bryan Lemma, MD or one of the following Advanced Practice Providers on your designated Care Team:   Theodore Demark, PA-C . Joni Reining, DNP, ANP  Any Other Special Instructions Will Be Listed Below (If Applicable).   CONTINUE WITH WEIGHT LOSS- EXERCISING OKAY TO PURCHASE A PELOTON

## 2018-10-21 NOTE — Progress Notes (Signed)
PCP: Sharion Balloon, FNP  Clinic Note: Chief Complaint  Patient presents with  . Follow-up    Doing well  . Coronary Artery Disease  . Cardiomyopathy    No real heart failure symptoms    HPI: Parker Sellers is a 58 y.o. male with a PMH notable for MV CAD-CABG who presents today for 5-6 month f/u (pre-op for Colonoscopy)..  Sept 2018: STE-ACS - aborted STEMI, with MV CAD noted on urgent Cardiac Cath --> referred for CABG.  CABG x5: LIMA-LAD, SeqfRIMA-OM1-OM2, SeqSVG-rPD-rPL  F/u STEMI - [facility] - CTO native RCA & SVG-rPD-rPL  MEHRAN GUDERIAN was last seen on Sept 9, 2019 doing well - has good & bad days.  Difficulty maintaining weight.    He was trying to stay active both at work but also trying to walk more.  He really denies any anginal chest tightness or pressure with rest or exertion and denies any significant exertional dyspnea besides deconditioning and obesity and underlying lung disease.  No PND, orthopnea trivial edema.  He had previously noted that he had quit smoking.    Recent Hospitalizations:   No hospitalizations  Studies Personally Reviewed - (if available, images/films reviewed: From Epic Chart or Care Everywhere)  No recent studies..  Interval History: Parker Sellers presents here today overall doing quite well.  He remains quite active with his job, but has gotten a little bit out of the habit of walking routinely for exercise.  He also has not been doing his good job with his diet having gained about 6 pounds since last visit.  His biggest problem is portion size.  For the most part he is doing fine with any activity noting that he will slow down every now and then if he feels his heart rate go up too fast.  When that happens he starts noticing a bit dyspnea and maybe even some chest discomfort if he pushes it too far.  If he stops to catch his breath, he does fine.  Nothing that is limiting as far as routine activities.  No resting or exertional chest pain or  dyspnea.  No PND, orthopnea or edema.  Hopes to be having his colonoscopy done in March.  This is for screening purposes.  No melena, hematochezia hematuria epistaxis.  Okay to hold Plavix.  No rapid irregular heartbeats or palpitations.  No syncope/near syncope or TIA shows amaurosis fugax.  No real claudication symptoms.  ROS: A comprehensive was performed. Pertinent symptoms noted above otherwise: Review of Systems  Constitutional: Negative for malaise/fatigue and weight loss.  HENT: Negative for congestion and nosebleeds.   Respiratory: Positive for cough (Morning cough is pretty much all but gone). Negative for shortness of breath and wheezing.   Gastrointestinal: Negative for abdominal pain, blood in stool, constipation, heartburn and melena.  Genitourinary: Negative for hematuria.  Musculoskeletal: Negative for joint pain.  Neurological: Negative for dizziness and focal weakness.  Endo/Heme/Allergies: Does not bruise/bleed easily.  Psychiatric/Behavioral: Negative for memory loss. The patient is not nervous/anxious and does not have insomnia.   All other systems reviewed and are negative.  I have reviewed and (if needed) personally updated the patient's problem list, medications, allergies, past medical and surgical history, social and family history.   Past Medical History:  Diagnosis Date  . Coronary artery disease involving native coronary artery of native heart with unstable angina pectoris ([place]) 05/22/2017   Cath 05/12/2017 (aborted inferior STEMI) - Ost LM 55%, d (apical) LAD 100%, D1 100% (chronic),  Om1 70%, mRCA 75% ulcerated lesion (culprit) - dRCA 55%, ostrPDA 60% & rPDA 60%. RPAV 70%, PAV2 95%. Difficult Radial Access - tortuous innominate artery.;; -->  Status post recurrent MI with occlusion of graft to the right system with negative RCA occlusion  . Hyperlipidemia   . Hypertension   . Ischemic cardiomyopathy 05/12/2017   EF by LV Gram 35-45%; Post MI Echo 45%, OR TEE  35-45%.  Gr 2 DD. -->  Initially resolved as of September 24, 2017.  EF 50-55%.  . S/P CABG x 5 05/14/2017   LIMA-LAD, seqSVG-OM1-OM2, free r-Rad-rPDA  . Sleep apnea    home CPAP  . ST elevation myocardial infarction (STEMI) of lateral wall, initial episode of care [facility]) 05/12/2017   Recurrent MI -February 2019 ([place]) -native RCA occluded along with vein graft to RPDA-RPL unable to wire.  . Tobacco use 05/12/2017    Past Surgical History:  Procedure Laterality Date  . CORONARY ARTERY BYPASS GRAFT N/A 05/14/2017   Procedure: CORONARY ARTERY BYPASS GRAFTING (CABG)/EVH x 5 five using bilateral internal mammary arteries and left saphenous vein.;  Surgeon: Melrose Nakayama, MD;  Location: [facility] OR;  Service: Open Heart Surgery(::) LIMA-LAD, seqSVG-OM1-OM2, free r-Rad-rPDA  . HERNIA REPAIR    . KNEE SURGERY Left    Torn meniscus  . LEFT HEART CATH AND CORONARY ANGIOGRAPHY N/A 05/12/2017   Procedure: LEFT HEART CATH AND CORONARY ANGIOGRAPHY;  Surgeon: Leonie Man, MD;  Location: [facility] INVASIVE CV LAB: MV CAD - ostLM 55%, dLAD 100%, D1 100%, OM 1 70%, mRCA 75%-dRCA 55%, ostPDA 60% - PDA 60^, PL1 70%, PK2 95%.  Mod-Severe LV Dysfunction - ~EF 35-45%  . LEFT HEART CATH AND CORS/GRAFTS ANGIOGRAPHY  10/30/2017   [facility] : LIMA-LAD patent, graft to OM-OM patent (in chart listed as SVG but this is actually the free RIMA), occluded SVG-RPDA-RPL -->  Native RCA is totally closed.  EF 40%.  ->  Attempt intervention on the RIMA  as it comes off the innominate artery was obviously unsuccessful as the vessel have been removed and attached to the aorta.  . TEE WITHOUT CARDIOVERSION N/A 05/14/2017   Procedure: TRANSESOPHAGEAL ECHOCARDIOGRAM (TEE);  Surgeon: Melrose Nakayama, MD;  Location: [facility];  Service: Open Heart Surgery: Mod Concentric LVH. EF ~35-40%.  Inferior hypokinesis & inferolateral hypokinesis. Dilated MV & TV annuli with only trace MR/TR.  Mild RV dilation with  moderately reduced function.  . TRANSTHORACIC ECHOCARDIOGRAM  05/13/2017   Mod LVH. Mildly reduced LVED ~45-50%. Akinesis of inferolateral & inferoseptal walls. Gr 2 DD. Mild Ascending Aortic dilation ~3.8 cm.  . TRANSTHORACIC ECHOCARDIOGRAM  09/24/2017   Mild LV dilation with mild increased thickness (mild LVH).  Low-normal function with a EF of 50-55%.  No regional wall motion normalities noted. -->  Echocardiogram done in [facility] in [place] also showed EF 50 to 50% with no obvious wall motion abnormality, technically difficult study.    LEFT HEART CATH AND CORONARY ANGIOGRAPHY: Presented as ~ STEMI 05/12/2017   Diagnostic Diagram --> pre-CABG. -->  Most recent cath 10/2017 shows occluded RCA with occluded graft to the RCA.       Current Meds  Medication Sig  . aspirin EC 81 MG tablet Take 1 tablet (81 mg total) by mouth daily.  Marland Kitchen atorvastatin (LIPITOR) 40 MG tablet TAKE 1 TABLET BY MOUTH ONCE DAILY  . clopidogrel (PLAVIX) 75 MG tablet TAKE 1 TABLET BY MOUTH ONCE DAILY  .  lisinopril-hydrochlorothiazide (PRINZIDE,ZESTORETIC) 20-12.5 MG tablet TAKE 2 TABLETS BY MOUTH ONCE DAILY  . metoprolol tartrate (LOPRESSOR) 25 MG tablet TAKE 1 & 1/2 (ONE & ONE-HALF) TABLETS BY MOUTH TWICE DAILY  . Multiple Vitamin (MULTIVITAMIN WITH MINERALS) TABS tablet Take 1 tablet by mouth daily.  . Na Sulfate-K Sulfate-Mg Sulf (SUPREP BOWEL PREP KIT) 17.5-3.13-1.6 GM/177ML SOLN Take 1 kit by mouth as directed.  . polyethylene glycol-electrolytes (TRILYTE) 420 g solution Take 4,000 mLs by mouth as directed.    No Known Allergies  Social History   Tobacco Use  . Smoking status: Former Research scientist (life sciences)  . Smokeless tobacco: Current User    Types: Snuff, Chew  Substance Use Topics  . Alcohol use: Yes    Alcohol/week: 2.0 standard drinks    Types: 2 Cans of beer per week    Comment: quit as of sept 2018  . Drug use: No    Types: Marijuana   Social History   Social History Narrative  . Not on  file    Family History  family history includes AAA (abdominal aortic aneurysm) in his father; Alcohol abuse in his father; Cancer in his mother; Coronary artery disease in his brother; Hypertension in his father; Liver disease in his father.  Wt Readings from Last 3 Encounters:  10/21/18 243 lb 9.6 oz (110.5 kg)  06/24/18 243 lb 3.2 oz (110.3 kg)  05/13/18 237 lb 6.4 oz (107.7 kg)   PHYSICAL EXAM BP 136/82   Pulse 61   Ht '5\' 8"'$  (1.727 m)   Wt 243 lb 9.6 oz (110.5 kg)   BMI 37.04 kg/m  Physical Exam  Constitutional: He is oriented to person, place, and time. He appears well-developed and well-nourished. No distress.  Moderately obese by BMI, but with risk factors, would be considered morbidly obese.. Well groomed, albeit wearing his dirty work clothes.   HENT:  Head: Normocephalic and atraumatic.  Neck: Normal range of motion. Neck supple. No hepatojugular reflux and no JVD (Very difficult to see because of body habitus) present. Carotid bruit is not present.  Cardiovascular: Normal rate, regular rhythm, S1 normal, S2 normal, intact distal pulses and normal pulses.  Occasional extrasystoles are present. PMI is not displaced (Unable to palpate). Exam reveals distant heart sounds. Exam reveals no gallop and no friction rub.  No murmur heard. Pulmonary/Chest: Effort normal and breath sounds normal. No respiratory distress. He has no wheezes. He has no rales.  Well-healed sternotomy scar. He still has his 73 place in the 3 chest tube sites. Healing well.  Abdominal: Soft. Bowel sounds are normal. He exhibits no distension. There is no abdominal tenderness. There is no rebound.  Musculoskeletal: Normal range of motion.        General: No edema (Maybe trivial).  Neurological: He is alert and oriented to person, place, and time. No cranial nerve deficit.  Psychiatric: He has a normal mood and affect. His behavior is normal. Judgment and thought content normal.  Nursing note and vitals  reviewed.   Adult ECG Report Not checked.   Other studies Reviewed: Additional studies/ records that were reviewed today include:  Recent Labs:    Lab Results  Component Value Date   CHOL 116 02/11/2018   HDL 28 (L) 02/11/2018   LDLCALC 54 02/11/2018   TRIG 168 (H) 02/11/2018   CHOLHDL 4.1 02/11/2018    Lab Results  Component Value Date   HGBA1C 5.2 05/12/2017   Lab Results  Component Value Date   CREATININE 1.42 (H) 06/24/2018  BUN 20 06/24/2018   NA 139 06/24/2018   K 4.3 06/24/2018   CL 101 06/24/2018   CO2 23 06/24/2018    ASSESSMENT / PLAN:  Overall stable from a cardiovascular standpoint.  Is now only on Plavix for maintenance.  Would be okay to hold Plavix 5-7 days prior to colonoscopy or other procedures.  Would be due for follow-up lipids in June -> if not checked by PCP, we will provide lab slip.  Needs to lose weight.  Problem List Items Addressed This Visit    Coronary artery disease involving native coronary artery of native heart without angina pectoris - Primary (Chronic)    Multivessel disease, referred for CABG now has occluded native distal RCA as well as the free RIMA-PDA-PL. Despite having essentially no flow to the inferior lateral wall, his EF seems to be preserved as of last January echocardiogram. He is on aspirin, Plavix as well as beta-blocker and ACE inhibitor and statin.  For now, okay to hold Plavix for procedures 5 to 7 days preop.      Relevant Orders   Comprehensive metabolic panel   Essential hypertension (Chronic)    Blood pressure looks pretty good on current meds.  No change.      Hyperlipidemia with target low density lipoprotein (LDL) cholesterol less than 70 mg/dL (Chronic)    On current dose of statin has been well controlled  Would be due for follow-up lipids in June -> if not checked by PCP, we will provide lab slip.      Relevant Orders   Lipid panel   Comprehensive metabolic panel   Ischemic cardiomyopathy  (Chronic)    EF pretty much resolved back to baseline normal. Despite apparent loss of flow to the inferior wall, no significant regional wall motion abnormalities noted which would suggest adequate collateralization.  Relatively euvolemic on exam.  Intermittently has edema but not that much.  Is on low-dose beta-blocker along with ACE inhibitor-HCTZ.      Relevant Orders   Comprehensive metabolic panel   Morbid obesity ([place]) (Chronic)    He may need some dietary counseling.  He also needs to increase his level of activity and exercise.  He hopes to get the [facility] bicycle machine for he and his stepson to work out on.      S/P CABG x 5 (Chronic)    5 vessel CABG with 1 of the sequential grafts being occluded.  3 out of 5 still patent.      ST elevation myocardial infarction (STEMI) of lateral wall, initial episode of care [facility]) (Chronic)    Initially status post aborted STEMI and then a non-STEMI following CABG. Thankfully, doing relatively well preserved EF and no heart failure symptoms.  On stable meds.          Current medicines are reviewed at length with the patient today. (+/- concerns) n/a The following changes have been made: n/a  Patient Instructions  Medication Instructions:  NOT NEEDED  WHEN YOU HAVE COLONOSCOPY MAY HOLD CLOPIDOGREL ( PLAVIX) 5 TO 7 DAYS PRIOR TO  PROCEDURE   DISCUSS WITH  GI DOCTOR.  If you need a refill on your cardiac medications before your next appointment, please call your pharmacy.   Lab work:  LIPID  CMP FOR June 2020- WILL MAIL LABSLIP IN MAY 2020 If you have labs (blood work) drawn today and your tests are completely normal, you will receive your results only by: Marland Kitchen MyChart Message (if you have MyChart) OR .  A paper copy in the mail If you have any lab test that is abnormal or we need to change your treatment, we will call you to review the results.  Testing/Procedures: NOT NEEDED  Follow-Up: At [facility], you and your  health needs are our priority.  As part of our continuing mission to provide you with exceptional heart care, we have created designated Provider Care Teams.  These Care Teams include your primary Cardiologist (physician) and Advanced Practice Providers (APPs -  Physician Assistants and Nurse Practitioners) who all work together to provide you with the care you need, when you need it. You will need a follow up appointment in 6 months AUG 2020.  Please call our office 2 months in advance to schedule this appointment.  You may see Glenetta Hew, MD or one of the following Advanced Practice Providers on your designated Care Team:   Rosaria Ferries, PA-C . Jory Sims, DNP, ANP  Any Other Special Instructions Will Be Listed Below (If Applicable).   CONTINUE WITH WEIGHT LOSS- EXERCISING OKAY TO PURCHASE A PELOTON       Studies Ordered:   Orders Placed This Encounter  Procedures  . Lipid panel  . Comprehensive metabolic panel      Glenetta Hew, M.D., M.S. Interventional Cardiologist   Pager # [phone] Phone # [phone] [address] [place], [place] [address]

## 2018-10-23 ENCOUNTER — Encounter: Payer: Self-pay | Admitting: Cardiology

## 2018-10-23 NOTE — Assessment & Plan Note (Signed)
5 vessel CABG with 1 of the sequential grafts being occluded.  3 out of 5 still patent.

## 2018-10-23 NOTE — Assessment & Plan Note (Signed)
He may need some dietary counseling.  He also needs to increase his level of activity and exercise.  He hopes to get the [facility] bicycle machine for he and his stepson to work out on.

## 2018-10-23 NOTE — Assessment & Plan Note (Signed)
On current dose of statin has been well controlled  Would be due for follow-up lipids in June -> if not checked by PCP, we will provide lab slip.

## 2018-10-23 NOTE — Assessment & Plan Note (Signed)
Blood pressure looks pretty good on current meds.  No change. 

## 2018-10-23 NOTE — Assessment & Plan Note (Signed)
EF pretty much resolved back to baseline normal. Despite apparent loss of flow to the inferior wall, no significant regional wall motion abnormalities noted which would suggest adequate collateralization.  Relatively euvolemic on exam.  Intermittently has edema but not that much.  Is on low-dose beta-blocker along with ACE inhibitor-HCTZ.

## 2018-10-23 NOTE — Assessment & Plan Note (Signed)
Multivessel disease, referred for CABG now has occluded native distal RCA as well as the free RIMA-PDA-PL. Despite having essentially no flow to the inferior lateral wall, his EF seems to be preserved as of last January echocardiogram. He is on aspirin, Plavix as well as beta-blocker and ACE inhibitor and statin.  For now, okay to hold Plavix for procedures 5 to 7 days preop.

## 2018-10-23 NOTE — Assessment & Plan Note (Signed)
Initially status post aborted STEMI and then a non-STEMI following CABG. Thankfully, doing relatively well preserved EF and no heart failure symptoms.  On stable meds.

## 2018-11-06 ENCOUNTER — Telehealth: Payer: Self-pay | Admitting: Gastroenterology

## 2018-11-06 NOTE — Telephone Encounter (Signed)
Pt needs to cancel his colonoscopy with SF on 3/13 and will reschedule later. He is going out of town for a couple of weeks due to work

## 2018-11-06 NOTE — Telephone Encounter (Signed)
Called endo, LM to cancel procedure.

## 2018-11-15 ENCOUNTER — Ambulatory Visit (HOSPITAL_COMMUNITY): Admit: 2018-11-15 | Payer: BLUE CROSS/BLUE SHIELD | Admitting: Gastroenterology

## 2018-11-15 ENCOUNTER — Encounter (HOSPITAL_COMMUNITY): Payer: Self-pay

## 2018-11-15 SURGERY — COLONOSCOPY
Anesthesia: Moderate Sedation

## 2018-11-21 ENCOUNTER — Other Ambulatory Visit: Payer: Self-pay | Admitting: Family

## 2018-11-21 DIAGNOSIS — I10 Essential (primary) hypertension: Secondary | ICD-10-CM

## 2018-12-23 ENCOUNTER — Ambulatory Visit: Payer: BLUE CROSS/BLUE SHIELD | Admitting: Family

## 2018-12-30 ENCOUNTER — Encounter: Payer: Self-pay | Admitting: Family

## 2018-12-30 ENCOUNTER — Ambulatory Visit: Payer: BLUE CROSS/BLUE SHIELD | Admitting: Family

## 2018-12-30 ENCOUNTER — Other Ambulatory Visit: Payer: Self-pay

## 2018-12-30 VITALS — BP 121/80 | HR 64 | Temp 96.8°F | Ht 68.0 in | Wt 245.0 lb

## 2018-12-30 DIAGNOSIS — E785 Hyperlipidemia, unspecified: Secondary | ICD-10-CM | POA: Diagnosis not present

## 2018-12-30 DIAGNOSIS — Z0001 Encounter for general adult medical examination with abnormal findings: Secondary | ICD-10-CM | POA: Diagnosis not present

## 2018-12-30 DIAGNOSIS — Z87891 Personal history of nicotine dependence: Secondary | ICD-10-CM

## 2018-12-30 DIAGNOSIS — I10 Essential (primary) hypertension: Secondary | ICD-10-CM | POA: Diagnosis not present

## 2018-12-30 DIAGNOSIS — Z0000 Encounter for general adult medical examination without abnormal findings: Secondary | ICD-10-CM

## 2018-12-30 DIAGNOSIS — Z951 Presence of aortocoronary bypass graft: Secondary | ICD-10-CM

## 2018-12-30 DIAGNOSIS — I2510 Atherosclerotic heart disease of native coronary artery without angina pectoris: Secondary | ICD-10-CM

## 2018-12-30 DIAGNOSIS — E8881 Metabolic syndrome: Secondary | ICD-10-CM

## 2018-12-30 NOTE — Patient Instructions (Signed)

## 2018-12-30 NOTE — Progress Notes (Signed)
Subjective:    Patient ID: Parker Sellers, male    DOB: October 31, 1960, 58 y.o.   MRN: [id number]  Chief Complaint  Patient presents with  . Medical Management of Chronic Issues   PT presents to the office today for CPE. PT is followed by Cardiologists every 6 months after his MI on 05/12/17. Hypertension  This is a chronic problem. The current episode started more than 1 year ago. The problem has been resolved since onset. The problem is controlled. Pertinent negatives include no headaches, malaise/fatigue, peripheral edema or shortness of breath. Risk factors for coronary artery disease include dyslipidemia, obesity, male gender and sedentary lifestyle. The current treatment provides moderate improvement. Hypertensive end-organ damage includes CAD/MI. There is no history of heart failure.  Hyperlipidemia  This is a chronic problem. The current episode started more than 1 year ago. The problem is controlled. Recent lipid tests were reviewed and are normal. Exacerbating diseases include obesity. Pertinent negatives include no shortness of breath. Current antihyperlipidemic treatment includes statins. The current treatment provides moderate improvement of lipids. Risk factors for coronary artery disease include dyslipidemia, male sex, obesity, hypertension and a sedentary lifestyle.  Metabolic Syndrome  Pt does not do any scheduled exercise. Takes Lipitor daily.    Review of Systems  Constitutional: Negative for malaise/fatigue.  Respiratory: Negative for shortness of breath.   Neurological: Negative for headaches.  All other systems reviewed and are negative.      Objective:   Physical Exam Vitals signs reviewed.  Constitutional:      General: He is not in acute distress.    Appearance: He is well-developed.  HENT:     Head: Normocephalic.     Right Ear: Tympanic membrane normal.     Left Ear: Tympanic membrane normal.  Eyes:     General:        Right eye: No discharge.      Left eye: No discharge.     Pupils: Pupils are equal, round, and reactive to light.  Neck:     Musculoskeletal: Normal range of motion and neck supple.     Thyroid: No thyromegaly.  Cardiovascular:     Rate and Rhythm: Normal rate and regular rhythm.     Heart sounds: Normal heart sounds. No murmur.  Pulmonary:     Effort: Pulmonary effort is normal. No respiratory distress.     Breath sounds: Normal breath sounds. No wheezing.  Abdominal:     General: Bowel sounds are normal. There is no distension.     Palpations: Abdomen is soft.     Tenderness: There is no abdominal tenderness.  Musculoskeletal: Normal range of motion.        General: No tenderness.  Skin:    General: Skin is warm and dry.     Findings: No erythema or rash.  Neurological:     Mental Status: He is alert and oriented to person, place, and time.     Cranial Nerves: No cranial nerve deficit.     Deep Tendon Reflexes: Reflexes are normal and symmetric.  Psychiatric:        Behavior: Behavior normal.        Thought Content: Thought content normal.        Judgment: Judgment normal.     BP 121/80   Pulse 64   Temp (!) 96.8 F (36 C) (Oral)   Ht _0  (1.727 m)   Wt 245 lb (111.1 kg)   BMI 37.25 kg/m  Assessment & Plan:  Parker Sellers comes in today with chief complaint of Medical Management of Chronic Issues   Diagnosis and orders addressed:  1. Essential hypertension - CMP14+EGFR - CBC with Differential/Platelet  2. Former smoker - CMP14+EGFR - CBC with Differential/Platelet  3. Hyperlipidemia with target low density lipoprotein (LDL) cholesterol less than 70 mg/dL - CMP14+EGFR - CBC with Differential/Platelet - Lipid panel  4. Coronary artery disease involving native coronary artery of native heart without angina pectoris - CMP14+EGFR - CBC with Differential/Platelet  5. Metabolic syndrome - YSH68+HFGB - CBC with Differential/Platelet  6. S/P CABG x 5 - CMP14+EGFR - CBC with  Differential/Platelet  7. Morbid obesity ([place]) - CMP14+EGFR - CBC with Differential/Platelet  8. Annual physical exam - CMP14+EGFR - CBC with Differential/Platelet - Lipid panel - PSA, total and free - TSH   Labs pending Health Maintenance reviewed Diet and exercise encouraged  Follow up plan: 6 months   Evelina Dun, FNP

## 2018-12-31 LAB — CBC WITH DIFFERENTIAL/PLATELET
Basophils Absolute: 0.1 10*3/uL (ref 0.0–0.2)
Basos: 1 %
EOS (ABSOLUTE): 0.6 10*3/uL — ABNORMAL HIGH (ref 0.0–0.4)
Eos: 7 %
Hematocrit: 49.5 % (ref 37.5–51.0)
Hemoglobin: 17.1 g/dL (ref 13.0–17.7)
Immature Grans (Abs): 0 10*3/uL (ref 0.0–0.1)
Immature Granulocytes: 0 %
Lymphocytes Absolute: 2 10*3/uL (ref 0.7–3.1)
Lymphs: 24 %
MCH: 30.8 pg (ref 26.6–33.0)
MCHC: 34.5 g/dL (ref 31.5–35.7)
MCV: 89 fL (ref 79–97)
Monocytes Absolute: 0.9 10*3/uL (ref 0.1–0.9)
Monocytes: 10 %
Neutrophils Absolute: 5.1 10*3/uL (ref 1.4–7.0)
Neutrophils: 58 %
Platelets: 248 10*3/uL (ref 150–450)
RBC: 5.55 x10E6/uL (ref 4.14–5.80)
RDW: 14.1 % (ref 11.6–15.4)
WBC: 8.7 10*3/uL (ref 3.4–10.8)

## 2018-12-31 LAB — CMP14+EGFR
ALT: 27 IU/L (ref 0–44)
AST: 21 IU/L (ref 0–40)
Albumin/Globulin Ratio: 1.6 (ref 1.2–2.2)
Albumin: 4.6 g/dL (ref 3.8–4.9)
Alkaline Phosphatase: 93 IU/L (ref 39–117)
BUN/Creatinine Ratio: 13 (ref 9–20)
BUN: 18 mg/dL (ref 6–24)
Bilirubin Total: 0.5 mg/dL (ref 0.0–1.2)
CO2: 19 mmol/L — ABNORMAL LOW (ref 20–29)
Calcium: 9.8 mg/dL (ref 8.7–10.2)
Chloride: 101 mmol/L (ref 96–106)
Creatinine, Ser: 1.43 mg/dL — ABNORMAL HIGH (ref 0.76–1.27)
GFR calc Af Amer: 62 mL/min/{1.73_m2} (ref >59)
GFR calc non Af Amer: 54 mL/min/{1.73_m2} — ABNORMAL LOW (ref >59)
Globulin, Total: 2.8 g/dL (ref 1.5–4.5)
Glucose: 110 mg/dL — ABNORMAL HIGH (ref 65–99)
Potassium: 4.4 mmol/L (ref 3.5–5.2)
Sodium: 139 mmol/L (ref 134–144)
Total Protein: 7.4 g/dL (ref 6.0–8.5)

## 2018-12-31 LAB — LIPID PANEL
Chol/HDL Ratio: 4.4 ratio (ref 0.0–5.0)
Cholesterol, Total: 127 mg/dL (ref 100–199)
HDL: 29 mg/dL — ABNORMAL LOW (ref >39)
LDL Calculated: 60 mg/dL (ref 0–99)
Triglycerides: 191 mg/dL — ABNORMAL HIGH (ref 0–149)
VLDL Cholesterol Cal: 38 mg/dL (ref 5–40)

## 2018-12-31 LAB — TSH: TSH: 2.58 u[IU]/mL (ref 0.450–4.500)

## 2018-12-31 LAB — PSA, TOTAL AND FREE
PSA, Free Pct: 58.6 %
PSA, Free: 0.41 ng/mL
Prostate Specific Ag, Serum: 0.7 ng/mL (ref 0.0–4.0)

## 2019-01-20 ENCOUNTER — Other Ambulatory Visit: Payer: Self-pay | Admitting: Cardiology

## 2019-03-03 ENCOUNTER — Other Ambulatory Visit: Payer: Self-pay | Admitting: Family

## 2019-03-03 DIAGNOSIS — I10 Essential (primary) hypertension: Secondary | ICD-10-CM

## 2019-03-17 ENCOUNTER — Telehealth: Payer: Self-pay | Admitting: Cardiology

## 2019-03-17 NOTE — Telephone Encounter (Signed)
  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 Please email [email or web address] if possible

## 2019-03-17 NOTE — Telephone Encounter (Signed)
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

## 2019-03-18 NOTE — Telephone Encounter (Signed)
I really think that it would be wise for him to be seen - at least virtually in order to assess his Sx in order to feel comfortable with writing a letter.  Also - need to know what the DOT examiner needs to hear.  Usually, the require a Stress Test.  Glenetta Hew, MD

## 2019-03-19 NOTE — Telephone Encounter (Signed)
Spoke with patient - patient prefer to have virtual appt with dr harding  schedule for 7/17/ 10:40 am    TELEPHONE CALL NOTE  This patient has been deemed a candidate for follow-up tele-health visit to limit community exposure during the Covid-19 pandemic. I spoke with the patient via phone to discuss instructions. This has been outlined on the patient's AVS (dotphrase: [email or web address]). The patient was advised to review the section on consent for treatment as well. The patient will receive a phone call 2-3 days prior to their E-Visit at which time consent will be verbally confirmed.   A Virtual Office Visit appointment type has been scheduled for 7/09/10/18 with HARDING, with "VIDEO"/TEXT    I have  confirmed the patient is active in [place] .   Raiford Simmonds, RN 03/19/2019 3:20 PM

## 2019-03-20 ENCOUNTER — Telehealth: Payer: Self-pay | Admitting: Cardiology

## 2019-03-20 NOTE — Telephone Encounter (Signed)
New Message ° ° ° °Left message to confirm appt and get consent  °

## 2019-03-20 NOTE — Telephone Encounter (Signed)

## 2019-03-21 ENCOUNTER — Telehealth: Payer: Self-pay | Admitting: *Deleted

## 2019-03-21 ENCOUNTER — Telehealth (INDEPENDENT_AMBULATORY_CARE_PROVIDER_SITE_OTHER): Payer: BC Managed Care – PPO | Admitting: Cardiology

## 2019-03-21 ENCOUNTER — Encounter: Payer: Self-pay | Admitting: Cardiology

## 2019-03-21 VITALS — BP 132/87 | HR 59 | Ht 68.0 in | Wt 245.0 lb

## 2019-03-21 DIAGNOSIS — Z021 Encounter for pre-employment examination: Secondary | ICD-10-CM | POA: Insufficient documentation

## 2019-03-21 DIAGNOSIS — E785 Hyperlipidemia, unspecified: Secondary | ICD-10-CM

## 2019-03-21 DIAGNOSIS — Z024 Encounter for examination for driving license: Secondary | ICD-10-CM | POA: Insufficient documentation

## 2019-03-21 DIAGNOSIS — I10 Essential (primary) hypertension: Secondary | ICD-10-CM

## 2019-03-21 DIAGNOSIS — I2510 Atherosclerotic heart disease of native coronary artery without angina pectoris: Secondary | ICD-10-CM

## 2019-03-21 DIAGNOSIS — I255 Ischemic cardiomyopathy: Secondary | ICD-10-CM

## 2019-03-21 NOTE — Assessment & Plan Note (Signed)
Recent labs show that his triglycerides are little high and LDL is still low.  We discussed adding fish oil.  Otherwise continue current dose of statin.  We will need to have labs checked (lipid panel and chemistry panel) prior to 52-month follow-up

## 2019-03-21 NOTE — Telephone Encounter (Signed)
Spoke to patient- instruction  Given from tele visit 03/21/19 avs summary  sent via mychart  labs due the early part of dec 2020. appt schedule for dec 14,2020 Patient verbalized understanding.

## 2019-03-21 NOTE — Assessment & Plan Note (Signed)
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  They need a letter from his cardiologist. He is very stable from a cardiac standpoint at this juncture with no active angina, heart failure or arrhythmia symptoms.  He has preserved ejection fraction of 50 to 55% following his post CABG MI in February 2019.  I see no reason from a cardiology standpoint that he should not be allowed to continue driving.

## 2019-03-21 NOTE — Patient Instructions (Addendum)
Medication Instructions:   No changes  If you need a refill on your cardiac medications before your next appointment, please call your pharmacy.   Lab work:  Fasting lipid panel and CMP prior to 42-month follow-up- will mail lab slip in Nov 2020.  If you have labs (blood work) drawn today and your tests are completely normal, you will receive your results only by: Marland Kitchen MyChart Message (if you have MyChart) OR . A paper copy in the mail If you have any lab test that is abnormal or we need to change your treatment, we will call you to review the results.  Testing/Procedures: Not needed  Follow-Up: At [facility], you and your health needs are our priority.  As part of our continuing mission to provide you with exceptional heart care, we have created designated Provider Care Teams.  These Care Teams include your primary Cardiologist (physician) and Advanced Practice Providers (APPs -  Physician Assistants and Nurse Practitioners) who all work together to provide you with the care you need, when you need it. . You will need a follow up appointment in  5  Months [place] 2020.  Please call our office 2 months in advance to schedule this appointment.  You may see Glenetta Hew, MD or one of the following Advanced Practice Providers on your designated Care Team:   . Rosaria Ferries, PA-C . Jory Sims, DNP, ANP  Any Other Special Instructions Will Be Listed Below. 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  Should be available as of Monday, July 20.

## 2019-03-21 NOTE — Assessment & Plan Note (Signed)
Well controlled on current medications. No change 

## 2019-03-21 NOTE — Progress Notes (Signed)
Virtual Visit via Video Note   This visit type was conducted due to national recommendations for restrictions regarding the COVID-19 Pandemic (e.g. social distancing) in an effort to limit this patient's exposure and mitigate transmission in our community.  Due to his co-morbid illnesses, this patient is at least at moderate risk for complications without adequate follow up.  This format is felt to be most appropriate for this patient at this time.  All issues noted in this document were discussed and addressed.  Sellers limited physical exam was performed with this format.  Please refer to the patient's chart for his consent to telehealth for [facility].   Patient has given verbal permission to conduct this visit via virtual appointment and to bill insurance 03/21/2019 2:21 PM     Evaluation Performed:  Follow-up visit  Date:  03/21/2019   ID:  Parker Sellers, DOB [birth year], MRN [id number]  Patient Location: Home Provider Location: Other:  Hospital Office  PCP:  Parker Sellers, Parker A, FNP  Cardiologist:  Parker Lemmaavid Harding, MD  Electrophysiologist:  None   Chief Complaint: 7840-month follow-up - Needs Letter for DOT   History of Present Illness:    Parker Sellers is Sellers 58 y.o. male with PMH notable for multivessel CAD-CABG who presents via audio/video conferencing for Sellers telehealth visit today.   Sept 2018: STE-ACS - aborted STEMI, with MV CAD noted on urgent Cardiac Cath --> referred for CABG.  CABG x5: LIMA-LAD, SeqfRIMA-OM1-OM2, SeqSVG-rPD-rPL  Feb 2019: F/u STEMI  - [facility]([place] - CTO native RCA & SVG-rPD-rPL  Parker Sellers was last seen in Feb 2020 - was doing well.  No issues.  Interval History:  Parker Sellers is doing very well.  He is staying active as much as possible, but having Sellers hard time getting exercise per se because of his work schedule.  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  He is doing his best to maintain physical distance and wearing mask when not able to  be more than 6 feet apart from colleagues.  He denies any major cardiac symptoms of chest tightness pressure with rest or exertion or any PND, orthopnea or edema.  Cardiovascular ROS: no chest pain or dyspnea on exertion negative for - edema, irregular heartbeat, loss of consciousness, orthopnea, palpitations, paroxysmal nocturnal dyspnea, rapid heart rate or shortness of breath  The patient does not have symptoms concerning for COVID-19 infection (fever, chills, cough, or new shortness of breath).  The patient is practicing social distancing.  ROS:  Please see the history of present illness.    Review of Systems  Constitutional: Negative for chills, fever, malaise/fatigue and weight loss.  HENT: Negative for congestion and nosebleeds.   Respiratory: Negative for cough, shortness of breath and wheezing.   Cardiovascular: Negative for claudication and leg swelling.  Gastrointestinal: Negative for blood in stool, heartburn (spicey foods) and melena.  Genitourinary: Negative for hematuria.  Musculoskeletal: Negative for joint pain and myalgias.  Neurological: Negative for dizziness, focal weakness, weakness and headaches.  Psychiatric/Behavioral: The patient does not have insomnia.   All other systems reviewed and are negative.   Past Medical History:  Diagnosis Date  . Coronary artery disease involving native coronary artery of native heart with unstable angina pectoris (HCC) 05/22/2017   Cath 05/12/2017 (aborted inferior STEMI) - Ost LM 55%, d (apical) LAD 100%, D1 100% (chronic), Om1 70%, mRCA 75% ulcerated lesion (culprit) - dRCA 55%, ostrPDA 60% & rPDA 60%. RPAV 70%, PAV2 95%.  Difficult Radial Access - tortuous innominate artery.;; -->  Status post recurrent MI with occlusion of graft to the right system with negative RCA occlusion  . Hyperlipidemia   . Hypertension   . Ischemic cardiomyopathy 05/12/2017   EF by LV Gram 35-45%; Post MI Echo 45%, OR TEE 35-45%.  Gr 2 DD. -->  Initially  resolved as of September 24, 2017.  EF 50-55%.  . S/P CABG x 5 05/14/2017   LIMA-LAD, seqSVG-OM1-OM2, free r-Rad-rPDA  . Sleep apnea    home CPAP  . ST elevation myocardial infarction (STEMI) of lateral wall, initial episode of care [facility](HCC) 05/12/2017   Recurrent MI -February 2019 [facility]([place] -native RCA occluded along with vein graft to RPDA-RPL unable to wire.  . Tobacco use 05/12/2017   Past Surgical History:  Procedure Laterality Date  . CORONARY ARTERY BYPASS GRAFT N/Sellers 05/14/2017   Procedure: CORONARY ARTERY BYPASS GRAFTING (CABG)/EVH x 5 five using bilateral internal mammary arteries and left saphenous vein.;  Surgeon: Parker Sellers, Parker C, MD;  Location: [facility] OR;  Service: Open Heart Surgery(::) LIMA-LAD, seqSVG-OM1-OM2, free r-Rad-rPDA  . HERNIA REPAIR    . KNEE SURGERY Left    Torn meniscus  . LEFT HEART CATH AND CORONARY ANGIOGRAPHY N/Sellers 05/12/2017   Procedure: LEFT HEART CATH AND CORONARY ANGIOGRAPHY;  Surgeon: Parker Sellers, Parker W, MD;  Location: [facility] INVASIVE CV LAB: MV CAD - ostLM 55%, dLAD 100%, D1 100%, OM 1 70%, mRCA 75%-dRCA 55%, ostPDA 60% - PDA 60^, PL1 70%, PK2 95%.  Mod-Severe LV Dysfunction - ~EF 35-45%  . LEFT HEART CATH AND CORS/GRAFTS ANGIOGRAPHY  10/30/2017   [facility] [place] : LIMA-LAD patent, graft to OM-OM patent (in chart listed as SVG but this is actually the free RIMA), occluded SVG-RPDA-RPL -->  Native RCA is totally closed.  EF 40%.  ->  Attempt intervention on the RIMA  as it comes off the innominate artery was obviously unsuccessful as the vessel have been removed and attached to the aorta.  . TEE WITHOUT CARDIOVERSION N/Sellers 05/14/2017   Procedure: TRANSESOPHAGEAL ECHOCARDIOGRAM (TEE);  Surgeon: Parker Sellers, Parker C, MD;  [facility];  Service: Open Heart Surgery: Mod Concentric LVH. EF ~35-40%.  Inferior hypokinesis & inferolateral hypokinesis. Dilated MV & TV annuli with only trace MR/TR.  Mild RV dilation with moderately reduced function.  .  TRANSTHORACIC ECHOCARDIOGRAM  05/13/2017   Mod LVH. Mildly reduced LVED ~45-50%. Akinesis of inferolateral & inferoseptal walls. Gr 2 DD. Mild Ascending Aortic dilation ~3.8 cm.  . TRANSTHORACIC ECHOCARDIOGRAM  1/'19 & 2/'29   Mild LV dilation with mild increased thickness (mild LVH).  Low-normal function with Sellers EF of 50-55%.  No regional wall motion normalities noted. -->  [facility] [place] (10/2017 - Inf STEMI with occluded RCA + Grafts) also showed EF 50 to 50% with no obvious wall motion abnormality, technically difficult study.     Diagnostic Diagram --> pre-CABG. -->  Most recent cath 10/2017 shows occluded RCA with occluded graft to the RCA.      Current Meds  Medication Sig  . aspirin EC 81 MG tablet Take 1 tablet (81 mg total) by mouth daily.  Marland Kitchen. atorvastatin (LIPITOR) 40 MG tablet TAKE 1 TABLET BY MOUTH ONCE DAILY  . clopidogrel (PLAVIX) 75 MG tablet Take 1 tablet by mouth once daily  . lisinopril-hydrochlorothiazide (ZESTORETIC) 20-12.5 MG tablet Take 2 tablets by mouth once daily  . metoprolol tartrate (LOPRESSOR) 25 MG tablet TAKE 1 &  1/2 (ONE & ONE-HALF) TABLETS BY MOUTH TWICE DAILY  . Multiple Vitamin (MULTIVITAMIN WITH MINERALS) TABS tablet Take 1 tablet by mouth daily.     Allergies:   Patient has no known allergies.   Social History   Tobacco Use  . Smoking status: Former Research scientist (life sciences)  . Smokeless tobacco: Current User    Types: Snuff, Chew  Substance Use Topics  . Alcohol use: Yes    Alcohol/week: 2.0 standard drinks    Types: 2 Cans of beer per week    Comment: quit as of sept 2018  . Drug use: No    Types: Marijuana     Family Hx: The patient's family history includes AAA (abdominal aortic aneurysm) in his father; Alcohol abuse in his father; Cancer in his mother; Coronary artery disease in his brother; Hypertension in his father; Liver disease in his father.   Prior CV studies:   The following studies were reviewed  today: . None:  Labs/Other Tests and Data Reviewed:    EKG:  No ECG reviewed.  Recent Labs: 12/30/2018: ALT 27; BUN 18; Creatinine, Ser 1.43; Hemoglobin 17.1; Platelets 248; Potassium 4.4; Sodium 139; TSH 2.580   Recent Lipid Panel Lab Results  Component Value Date/Time   CHOL 127 12/30/2018 08:26 AM   TRIG 191 (H) 12/30/2018 08:26 AM   HDL 29 (L) 12/30/2018 08:26 AM   CHOLHDL 4.4 12/30/2018 08:26 AM   CHOLHDL 6.7 05/12/2017 11:50 AM   LDLCALC 60 12/30/2018 08:26 AM    Wt Readings from Last 3 Encounters:  03/21/19 245 lb (111.1 kg)  12/30/18 245 lb (111.1 kg)  10/21/18 243 lb 9.6 oz (110.5 kg)     Objective:    Vital Signs:  BP 132/87   Pulse (!) 59   Ht 5\' 8"  (5.188 m)   Wt 245 lb (111.1 kg)   BMI 37.25 kg/m   - before Meds this AM VITAL SIGNS:  reviewed GEN:  no acute distress RESPIRATORY:  normal respiratory effort, symmetric expansion CARDIOVASCULAR:  no peripheral edema MUSCULOSKELETAL:  no obvious deformities. 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  They need Sellers letter from his cardiologist. He is very stable from Sellers cardiac standpoint at this juncture with no active angina, heart failure or arrhythmia symptoms.  He has preserved ejection fraction of 50 to 55% following his post CABG MI in February 2019.  I see no reason from Sellers cardiology standpoint that he should not be allowed to continue driving.      Ischemic cardiomyopathy (Chronic)    Initially reduced EF of the time of diagnosis improved to 50 to 55% even after inferior MI.  No active heart rate symptoms.  Euvolemic.      Hyperlipidemia with target low density lipoprotein (LDL) cholesterol less than 70 mg/dL (Chronic)    Recent labs show that his triglycerides are little high and LDL is  still low.  We discussed adding fish oil.  Otherwise continue current dose of statin.  We will need to have labs checked (lipid panel and chemistry panel) prior to 60-month follow-up      Relevant Orders   Lipid panel   Comprehensive metabolic panel   Essential hypertension (Chronic)    Well-controlled on current medications.  No  change      Coronary artery disease involving native coronary artery of native heart without angina pectoris - Primary (Chronic)    Despite having MV CAD-CABG with occluded RCA system (both Native vessel & Graft) he remains active and has no active anginal symptoms.  He also had preserved EF with no active heart failure symptoms.  Remains on aspirin and Plavix which can both be held (5-7 days) for procedures if necessary. He is on stable dose of beta-blocker plus ACE inhibitor and statin.      Relevant Orders   Comprehensive metabolic panel      COVID-19 Education: The signs and symptoms of COVID-19 were discussed with the patient and how to seek care for testing (follow up with PCP or arrange E-visit).   The importance of social distancing was discussed today.  Time:   Today, I have spent 16 minutes with the patient with telehealth technology discussing the above problems.  As part of this visit, we discussed DOT physical requirements.  I will also be dictating Sellers letter which will be an additional 5 to 10 minutes beyond additional charting.   Medication Adjustments/Labs and Tests Ordered: Current medicines are reviewed at length with the patient today.  Concerns regarding medicines are outlined above.  Medication Instructions:  Go back to taking Fish oil - up to 3,000 mg daily  Tests Ordered: Orders Placed This Encounter  Procedures  . Lipid panel  . Comprehensive metabolic panel    Fasting lipid panel, CMP prior to 5735-month follow-up   Medication Changes: No orders of the defined types were placed in this encounter.   Disposition:  Follow  up in 5 month(s)    Signed, Parker Lemmaavid Harding, MD  03/21/2019 2:21 PM    [facility]

## 2019-03-21 NOTE — Assessment & Plan Note (Addendum)
Despite having MV CAD-CABG with occluded RCA system (both Native vessel & Graft) he remains active and has no active anginal symptoms.  He also had preserved EF with no active heart failure symptoms.  Remains on aspirin and Plavix which can both be held (5-7 days) for procedures if necessary. He is on stable dose of beta-blocker plus ACE inhibitor and statin.

## 2019-03-21 NOTE — Telephone Encounter (Signed)
Patient aware  Requested letter has been faxed to [place] and emailed to patient.

## 2019-03-21 NOTE — Assessment & Plan Note (Signed)
Initially reduced EF of the time of diagnosis improved to 50 to 55% even after inferior MI.  No active heart rate symptoms.  Euvolemic.

## 2019-04-16 ENCOUNTER — Other Ambulatory Visit: Payer: Self-pay | Admitting: Cardiology

## 2019-06-10 ENCOUNTER — Other Ambulatory Visit: Payer: Self-pay | Admitting: Family

## 2019-06-10 DIAGNOSIS — I10 Essential (primary) hypertension: Secondary | ICD-10-CM

## 2019-06-26 ENCOUNTER — Other Ambulatory Visit: Payer: Self-pay

## 2019-06-27 ENCOUNTER — Other Ambulatory Visit: Payer: Self-pay

## 2019-06-30 ENCOUNTER — Other Ambulatory Visit: Payer: Self-pay

## 2019-06-30 ENCOUNTER — Ambulatory Visit (INDEPENDENT_AMBULATORY_CARE_PROVIDER_SITE_OTHER): Payer: BC Managed Care – PPO

## 2019-06-30 ENCOUNTER — Ambulatory Visit (INDEPENDENT_AMBULATORY_CARE_PROVIDER_SITE_OTHER): Payer: BC Managed Care – PPO | Admitting: Family

## 2019-06-30 ENCOUNTER — Encounter: Payer: Self-pay | Admitting: Family

## 2019-06-30 DIAGNOSIS — I10 Essential (primary) hypertension: Secondary | ICD-10-CM

## 2019-06-30 DIAGNOSIS — Z23 Encounter for immunization: Secondary | ICD-10-CM

## 2019-06-30 DIAGNOSIS — Z951 Presence of aortocoronary bypass graft: Secondary | ICD-10-CM

## 2019-06-30 DIAGNOSIS — I2510 Atherosclerotic heart disease of native coronary artery without angina pectoris: Secondary | ICD-10-CM

## 2019-06-30 DIAGNOSIS — E785 Hyperlipidemia, unspecified: Secondary | ICD-10-CM

## 2019-06-30 DIAGNOSIS — Z87891 Personal history of nicotine dependence: Secondary | ICD-10-CM

## 2019-06-30 DIAGNOSIS — E8881 Metabolic syndrome: Secondary | ICD-10-CM

## 2019-06-30 NOTE — Progress Notes (Signed)
Virtual Visit via telephone Note Due to COVID-19 pandemic this visit was conducted virtually. This visit type was conducted due to national recommendations for restrictions regarding the COVID-19 Pandemic (e.g. social distancing, sheltering in place) in an effort to limit this patient's exposure and mitigate transmission in our community. All issues noted in this document were discussed and addressed.  A physical exam was not performed with this format.  I connected with Parker Sellers on 06/30/19 at 8:45 AM by telephone and verified that I am speaking with the correct person using two identifiers. Parker Sellers is currently located at home and no one is currently with no one during visit. The provider, Evelina Dun, FNP is located in their office at time of visit.  I discussed the limitations, risks, security and privacy concerns of performing an evaluation and management service by telephone and the availability of in person appointments. I also discussed with the patient that there may be a patient responsible charge related to this service. The patient expressed understanding and agreed to proceed.   History and Present Illness:  PT calls the office today for chronic follow up. PT is followed by Cardiologists every76month after his MI on 05/12/17. Hypertension This is a chronic problem. The current episode started more than 1 year ago. The problem has been resolved since onset. The problem is controlled. Pertinent negatives include no malaise/fatigue, peripheral edema or shortness of breath. Risk factors for coronary artery disease include dyslipidemia, obesity, male gender, sedentary lifestyle and smoking/tobacco exposure. The current treatment provides moderate improvement. There is no history of kidney disease.  Hyperlipidemia This is a chronic problem. The current episode started more than 1 year ago. The problem is controlled. Recent lipid tests were reviewed and are normal.  Pertinent negatives include no shortness of breath.      Review of Systems  Constitutional: Negative for malaise/fatigue.  Respiratory: Negative for shortness of breath.   All other systems reviewed and are negative.    Observations/Objective: No SOB or distress noted  Assessment and Plan: 1. Essential hypertension - CMP14+EGFR; Future - CBC with Differential/Platelet; Future  2. Coronary artery disease involving native coronary artery of native heart without angina pectoris - CMP14+EGFR; Future - CBC with Differential/Platelet; Future  3. S/P CABG x 5 - CMP14+EGFR; Future - CBC with Differential/Platelet; Future  4. Morbid obesity ([place] - CMP14+EGFR; Future - CBC with Differential/Platelet; Future  5. Hyperlipidemia with target low density lipoprotein (LDL) cholesterol less than 70 mg/dL - CMP14+EGFR; Future - CBC with Differential/Platelet; Future  6. Former smoker - CMP14+EGFR; Future - CBC with Differential/Platelet; Future  7. Metabolic syndrome - [id number]+XBJY Future - CBC with Differential/Platelet; Future  Labs pending  Health maintenance reviewed RTO in 6 months and keep Cardiologists follow up   I discussed the assessment and treatment plan with the patient. The patient was provided an opportunity to ask questions and all were answered. The patient agreed with the plan and demonstrated an understanding of the instructions.   The patient was advised to call back or seek an in-person evaluation if the symptoms worsen or if the condition fails to improve as anticipated.  The above assessment and management plan was discussed with the patient. The patient verbalized understanding of and has agreed to the management plan. Patient is aware to call the clinic if symptoms persist or worsen. Patient is aware when to return to the clinic for a follow-up visit. Patient educated on when it is appropriate to go to the  emergency department.   Time call ended:  9:01AM   I provided 16 minutes of non-face-to-face time during this encounter.    Evelina Dun, FNP

## 2019-07-21 ENCOUNTER — Other Ambulatory Visit: Payer: Self-pay | Admitting: Cardiology

## 2019-08-18 ENCOUNTER — Encounter: Payer: Self-pay | Admitting: Cardiology

## 2019-08-18 ENCOUNTER — Ambulatory Visit (INDEPENDENT_AMBULATORY_CARE_PROVIDER_SITE_OTHER): Payer: 59 | Admitting: Cardiology

## 2019-08-18 ENCOUNTER — Other Ambulatory Visit: Payer: Self-pay

## 2019-08-18 VITALS — BP 114/73 | HR 53 | Temp 98.1°F | Ht 68.0 in | Wt 249.0 lb

## 2019-08-18 DIAGNOSIS — E8881 Metabolic syndrome: Secondary | ICD-10-CM

## 2019-08-18 DIAGNOSIS — I255 Ischemic cardiomyopathy: Secondary | ICD-10-CM

## 2019-08-18 DIAGNOSIS — I2510 Atherosclerotic heart disease of native coronary artery without angina pectoris: Secondary | ICD-10-CM

## 2019-08-18 DIAGNOSIS — Z951 Presence of aortocoronary bypass graft: Secondary | ICD-10-CM

## 2019-08-18 DIAGNOSIS — E785 Hyperlipidemia, unspecified: Secondary | ICD-10-CM

## 2019-08-18 DIAGNOSIS — Z021 Encounter for pre-employment examination: Secondary | ICD-10-CM

## 2019-08-18 DIAGNOSIS — I10 Essential (primary) hypertension: Secondary | ICD-10-CM

## 2019-08-18 NOTE — Progress Notes (Signed)
Primary Care Provider: Junie SpencerHawks, Christy A, FNP Cardiologist: Bryan Lemmaavid Kriss Perleberg, MD Electrophysiologist: n/a  Clinic Note: Chief Complaint  Patient presents with  . Follow-up    6 months  . Coronary Artery Disease    No angina   HPI:    Parker Sellers is a 58 y.o. male with a PMH notable for MV CAD-aborted STEMI (CABG -> with recurrent STEMI due to occlusion sequential SVG) who presents today for 357-month follow-up  CAD History  Sept 2018: STE-ACS - aborted STEMI, with MV CAD noted on urgent Cardiac Cath --> referred for CABG. ? CABG x5: LIMA-LAD, SeqfRIMA-OM1-OM2, SeqSVG-rPD-rPL  Feb 2019: F/u STEMI  - [facility]([place] - CTO native RCA & SVG-rPD-rPL  Last Echo Jan 2019 - EF 50-55%. Mild LVH. Trivial AI, Mild LA dilation.  Parker Sellers was last seen on March 21, 2019 by telemedicine.  He was doing quite well staying as active as possible.  Finding it hard to exercise due to his work schedule.  Still driving a truck, traveling quite a bit.  Doing his best to maintain social distance and wearing mask when necessary.  No chest pain or pressure.  Recent Hospitalizations: None  Reviewed  CV studies:    The following studies were reviewed today: (if available, images/films reviewed: From Epic Chart or Care Everywhere) . None:   Interval History:   Parker Sellers returns today for his routine follow-up doing fairly well.  He got off of the his weight loss kick, somewhat less active during the COVID-19 lockdown.  He is still doing his driving job, and feels somewhat exhausted when he gets home from work.  He otherwise only notes some exertional dyspnea if he really pushes it doing heavy loads moving lots of weight.  He otherwise denies chest pain or dyspnea with routine activity or exercise.  He is due for his updated DOT physical this summer in July. He routinely uses CPAP without any difficulty.   CV Review of Symptoms (Summary) no chest pain or dyspnea on exertion negative for -  edema, irregular heartbeat, orthopnea, palpitations, paroxysmal nocturnal dyspnea, rapid heart rate, shortness of breath or TIA/ amaurosis fugax, syncope/near syncope; claudication.   The patient does not have symptoms concerning for COVID-19 infection (fever, chills, cough, or new shortness of breath).  The patient is practicing social distancing. ++ Masking.  He does go out for his groceries/shopping.  Continues to try to exercise safe social distancing and masking while at work, near people.  Usually is alone.   REVIEWED OF SYSTEMS   A comprehensive ROS was performed. Review of Systems  Constitutional: Negative for malaise/fatigue and weight loss.  HENT: Negative for congestion and nosebleeds.   Respiratory: Negative for cough, shortness of breath and wheezing.   Gastrointestinal: Positive for heartburn (spicy foods). Negative for blood in stool and melena.  Genitourinary: Negative for hematuria.       No ED issues  Musculoskeletal: Positive for joint pain and myalgias (only if he over does it). Negative for falls.  Neurological: Negative for dizziness and headaches.  Endo/Heme/Allergies: Positive for environmental allergies (usually controlled with allergy medication).  Psychiatric/Behavioral: The patient does not have insomnia.    I have reviewed and (if needed) personally updated the patient's problem list, medications, allergies, past medical and surgical history, social and family history.   PAST MEDICAL HISTORY   Past Medical History:  Diagnosis Date  . Coronary artery disease involving native coronary artery of native heart with unstable angina pectoris (HCC)  05/22/2017   Cath 05/12/2017 (aborted inferior STEMI) - Ost LM 55%, d (apical) LAD 100%, D1 100% (chronic), Om1 70%, mRCA 75% ulcerated lesion (culprit) - dRCA 55%, ostrPDA 60% & rPDA 60%. RPAV 70%, PAV2 95%. Difficult Radial Access - tortuous innominate artery.;; -->  Status post recurrent MI with occlusion of graft to the  right system with negative RCA occlusion  . Hyperlipidemia   . Hypertension   . Ischemic cardiomyopathy 05/12/2017   EF by LV Gram 35-45%; Post MI Echo 45%, OR TEE 35-45%.  Gr 2 DD. -->  Initially resolved as of September 24, 2017.  EF 50-55%.  . S/P CABG x 5 05/14/2017   LIMA-LAD, seqSVG-OM1-OM2, free r-Rad-rPDA  . Sleep apnea    home CPAP  . ST elevation myocardial infarction (STEMI) of lateral wall, initial episode of care [facility]) 05/12/2017   Recurrent MI -February 2019 [facility]) -native RCA occluded along with vein graft to RPDA-RPL unable to wire.  . Tobacco use 05/12/2017    PAST SURGICAL HISTORY   Past Surgical History:  Procedure Laterality Date  . CORONARY ARTERY BYPASS GRAFT N/A 05/14/2017   Procedure: CORONARY ARTERY BYPASS GRAFTING (CABG)/EVH x 5 five using bilateral internal mammary arteries and left saphenous vein.;  Surgeon: Loreli Slot, MD;  Location: [facility] OR;  Service: Open Heart Surgery(::) LIMA-LAD, seqSVG-OM1-OM2, free r-Rad-rPDA  . HERNIA REPAIR    . KNEE SURGERY Left    Torn meniscus  . LEFT HEART CATH AND CORONARY ANGIOGRAPHY N/A 05/12/2017   Procedure: LEFT HEART CATH AND CORONARY ANGIOGRAPHY;  Surgeon: Marykay Lex, MD;  Location: [facility] INVASIVE CV LAB: MV CAD - ostLM 55%, dLAD 100%, D1 100%, OM 1 70%, mRCA 75%-dRCA 55%, ostPDA 60% - PDA 60^, PL1 70%, PK2 95%.  Mod-Severe LV Dysfunction - ~EF 35-45%  . LEFT HEART CATH AND CORS/GRAFTS ANGIOGRAPHY  10/30/2017   [facility], [place] : LIMA-LAD patent, graft to OM-OM patent (in chart listed as SVG but this is actually the free RIMA), occluded SVG-RPDA-RPL -->  Native RCA is totally closed.  EF 40%.  ->  Attempt intervention on the RIMA  as it comes off the innominate artery was obviously unsuccessful as the vessel have been removed and attached to the aorta.  . TEE WITHOUT CARDIOVERSION N/A 05/14/2017   Procedure: TRANSESOPHAGEAL ECHOCARDIOGRAM (TEE);  Surgeon: Loreli Slot, MD;   Location: [facility] OR;  Service: Open Heart Surgery: Mod Concentric LVH. EF ~35-40%.  Inferior hypokinesis & inferolateral hypokinesis. Dilated MV & TV annuli with only trace MR/TR.  Mild RV dilation with moderately reduced function.  . TRANSTHORACIC ECHOCARDIOGRAM  05/13/2017   Mod LVH. Mildly reduced LVED ~45-50%. Akinesis of inferolateral & inferoseptal walls. Gr 2 DD. Mild Ascending Aortic dilation ~3.8 cm.  . TRANSTHORACIC ECHOCARDIOGRAM  1/'19 & 2/'29   Mild LV dilation with mild increased thickness (mild LVH).  Low-normal function with a EF of 50-55%.  No regional wall motion normalities noted. -->  Echocardiogram done in [facility] in [place] (10/2017 - Inf STEMI with occluded RCA + Grafts) also showed EF 50 to 50% with no obvious wall motion abnormality, technically difficult study.    LEFT HEART CATH AND CORONARY ANGIOGRAPHY: Presented as ~ aborted STEMI 05/12/2017 - Diagnostic Diagram --> pre-CABG (LIMA-LAD, Seq SVG-rPDA-PL, frRIMA-OM-OM).   MV CAD - ostLM 55%, dLAD 100%, D1 100%, OM 1 70%, mRCA 75%-dRCA 55%, ostPDA 60% - PDA 60^, RPAV-PL1 70%, PL2 95%.    -->  Most recent  cath 10/2017 shows occluded RCA with occluded graft to the RCA. SeqSVG-RPDA-PL.        MEDICATIONS/ALLERGIES   Current Meds  Medication Sig  . aspirin EC 81 MG tablet Take 1 tablet (81 mg total) by mouth daily.  Marland Kitchen atorvastatin (LIPITOR) 40 MG tablet Take 1 tablet by mouth once daily  . clopidogrel (PLAVIX) 75 MG tablet Take 1 tablet by mouth once daily  . lisinopril-hydrochlorothiazide (ZESTORETIC) 20-12.5 MG tablet Take 2 tablets by mouth once daily  . metoprolol tartrate (LOPRESSOR) 25 MG tablet TAKE 1 & 1/2 (ONE & ONE-HALF) TABLETS BY MOUTH TWICE DAILY  . Multiple Vitamin (MULTIVITAMIN WITH MINERALS) TABS tablet Take 1 tablet by mouth daily.  . Omega-3 Fatty Acids (FISH OIL) 1000 MG CAPS Take by mouth.    No Known Allergies   SOCIAL HISTORY/FAMILY HISTORY   Social History   Tobacco Use  .  Smoking status: Former Games developer  . Smokeless tobacco: Current User    Types: Snuff, Chew  Substance Use Topics  . Alcohol use: Yes    Alcohol/week: 2.0 standard drinks    Types: 2 Cans of beer per week    Comment: quit as of sept 2018  . Drug use: No    Types: Marijuana   Social History   Social History Narrative  . Not on file    Family History family history includes AAA (abdominal aortic aneurysm) in his father; Alcohol abuse in his father; Cancer in his mother; Coronary artery disease in his brother; Hypertension in his father; Liver disease in his father.   OBJCTIVE -PE, EKG, labs   Wt Readings from Last 3 Encounters:  08/18/19 249 lb (112.9 kg)  03/21/19 245 lb (111.1 kg)  12/30/18 245 lb (111.1 kg)    Physical Exam: BP 114/73   Pulse (!) 53   Temp 98.1 F (36.7 C)   Ht  (1.727 m)   Wt 249 lb (112.9 kg)   SpO2 96%   BMI 37.86 kg/m  Physical Exam  Constitutional: He is oriented to person, place, and time. He appears well-developed and well-nourished. No distress.  Moderate-morbidly obese mostly truncal obesity.  Pretty well-groomed.  Wearing his work clothes.  Healthy-appearing.  HENT:  Head: Normocephalic and atraumatic.  Eyes: EOM are normal.  Neck: No hepatojugular reflux and no JVD present. Carotid bruit is not present. No thyromegaly present.  Difficult assess JVD or HJR due to body habitus.  Cardiovascular: Normal rate, regular rhythm, S1 normal and S2 normal.  Occasional extrasystoles are present. PMI is not displaced (Difficult to palpate due to body habitus). Exam reveals distant heart sounds and decreased pulses (Somewhat decreased pedal pulses). Exam reveals no gallop and no friction rub.  Murmur heard. Pulmonary/Chest: Effort normal and breath sounds normal. No respiratory distress. He has no wheezes. He has no rales.  Well-healed sternotomy scar.  Abdominal: Soft. Bowel sounds are normal. He exhibits no distension. There is no abdominal  tenderness. There is no rebound.  Rotund obese abdomen.  Unable to palpate HSM  Musculoskeletal:        General: Edema (Trivial pedal) present. Normal range of motion.     Cervical back: Normal range of motion and neck supple.  Neurological: He is alert and oriented to person, place, and time.  Skin: Skin is warm and dry. No rash noted.  Psychiatric: He has a normal mood and affect. His behavior is normal. Judgment and thought content normal.  Vitals reviewed.   Adult ECG Report  Rate: 55 ;  Rhythm: sinus bradycardia and normal axis, intervals & durations. ;   Narrative Interpretation: Normal EKG  Recent Labs:  n/a  Lab Results  Component Value Date   CHOL 127 12/30/2018   HDL 29 (L) 12/30/2018   LDLCALC 60 12/30/2018   TRIG 191 (H) 12/30/2018   CHOLHDL 4.4 12/30/2018   Lab Results  Component Value Date   CREATININE 1.43 (H) 12/30/2018   BUN 18 12/30/2018   NA 139 12/30/2018   K 4.4 12/30/2018   CL 101 12/30/2018   CO2 19 (L) 12/30/2018    ASSESSMENT/PLAN    Problem List Items Addressed This Visit    Coronary artery disease involving native coronary artery of native heart without angina pectoris - Primary (Chronic)    Has multivessel CAD with CABG and then already known occluded grafts and mostly native to the RCA system.  Thankfully, no active angina symptoms suggesting his other grafts are patent.    He is on aspirin plus Plavix (okay to hold for procedures 5-7 days preop)  On stable dose of statin, beta-blocker and ACE inhibitor.      Relevant Orders   EKG 12-Lead   Ischemic cardiomyopathy (Chronic)    EF improved almost all to 50 to 55% after recurrent MI.  No active heart failure symptoms.  Feels well.  Euvolemic on exam.  Is really only on low-dose Lopressor and Zestoretic (ACE inhibitor-HCTZ combo).  Other than low-dose HCTZ, not requiring diuretic.      Relevant Orders   EKG 12-Lead   Essential hypertension (Chronic)    Blood pressures well controlled  on current meds.  No change.  Stable for CAD.      Hyperlipidemia with target low density lipoprotein (LDL) cholesterol less than 70 mg/dL (Chronic)    Triglycerides have been a little bit high, but LDL was pretty well controlled as of this April.  We started him on fish oil which he is able to tolerate twice daily.  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 Should be due to have lipids checked in April prior to 5-73-month follow-up..      S/P CABG x 5 (Chronic)    3-5 graft still patent with occluded free radial to the RCA system.  No active angina.  Somewhat reluctant to do stress test unless necessary for symptoms based on the fact that he likely will have abnormal findings in the inferior wall.      Relevant Orders   EKG 12-Lead   Morbid obesity (HCC) (Chronic)    Again talked importance of dietary modification and increased exercise.  Difficult given his current line of work, but I would recommend that he tries get out and do some walking in between runs.      Metabolic syndrome    Obesity, hypertension and dyslipidemia with high triglycerides.  Discussed how this places him at higher overall risk which goes along with his existing CAD.  Need to closely monitor his renal function as well as glycemic control.      Pre-employment examination    Due for DOT physical evaluation in July.  He says he only needs a letter, so we will try to see him back closer to that time in order to ensure that he is not having any active symptoms.  If necessary, we could check a stress test.  As it stands, there would be no clinical reason to check a stress  test as he is doing well with no major angina or heart failure symptoms.      Relevant Orders   EKG 12-Lead      COVID-19 Education: The signs and symptoms of COVID-19 were discussed with the patient and how to seek care for testing (follow up with PCP or arrange E-visit).   The importance of  social distancing was discussed today.  I spent a total of 31minutes with the patient and chart review. >  50% of the time was spent in direct patient consultation.  Additional time spent with chart review (studies, outside notes, etc): 8 Total Time: 26 min   Current medicines are reviewed at length with the patient today.  (+/- concerns) none   Patient Instructions / Medication Changes & Studies & Tests Ordered   Patient Instructions  Medication Instructions:  NO CHANGES  *If you need a refill on your cardiac medications before your next appointment, please call your pharmacy*  Lab Work: NOT NEEDED  Testing/Procedures: NOT NEEDED  Follow-Up: At [facility], you and your health needs are our priority.  As part of our continuing mission to provide you with exceptional heart care, we have created designated Provider Care Teams.  These Care Teams include your primary Cardiologist (physician) and Advanced Practice Providers (APPs -  Physician Assistants and Nurse Practitioners) who all work together to provide you with the care you need, when you need it.  Your next appointment:   5  OR 6 month(s) ( MAY OR June 2021  The format for your next appointment:   In Person  Provider:   Glenetta Hew, MD  Other Instructions  BEFORE  YOUR NEXT APPOINTMENT ASK YOUR EMPLOYER IF YOU NEED TO HAVE A D.O.T. PHYSICAL  ,SO IT CAN BE ORDER IF NEEDED    Studies Ordered:   Orders Placed This Encounter  Procedures  . EKG 12-Lead     Glenetta Hew, M.D., M.S. Interventional Cardiologist   Pager # [phone] Phone # [phone] [address] [place], [place] [address]   Thank you for choosing Heartcare at [facility]!!

## 2019-08-18 NOTE — Patient Instructions (Signed)
Medication Instructions:  NO CHANGES  *If you need a refill on your cardiac medications before your next appointment, please call your pharmacy*  Lab Work: NOT NEEDED  Testing/Procedures: NOT NEEDED  Follow-Up: At [facility], you and your health needs are our priority.  As part of our continuing mission to provide you with exceptional heart care, we have created designated Provider Care Teams.  These Care Teams include your primary Cardiologist (physician) and Advanced Practice Providers (APPs -  Physician Assistants and Nurse Practitioners) who all work together to provide you with the care you need, when you need it.  Your next appointment:   5  OR 6 month(s) ( MAY OR June 2021  The format for your next appointment:   In Person  Provider:   Glenetta Hew, MD  Other Instructions  BEFORE  YOUR NEXT APPOINTMENT ASK YOUR EMPLOYER IF YOU NEED TO HAVE A D.O.T. PHYSICAL  ,SO IT CAN BE ORDER IF NEEDED

## 2019-08-19 ENCOUNTER — Encounter: Payer: Self-pay | Admitting: Cardiology

## 2019-08-19 NOTE — Assessment & Plan Note (Signed)
Triglycerides have been a little bit high, but LDL was pretty well controlled as of this April.  We started him on fish oil which he is able to tolerate twice daily.  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 Should be due to have lipids checked in April prior to 5-59-month follow-up.Marland Kitchen

## 2019-08-19 NOTE — Assessment & Plan Note (Signed)
3-5 graft still patent with occluded free radial to the RCA system.  No active angina.  Somewhat reluctant to do stress test unless necessary for symptoms based on the fact that he likely will have abnormal findings in the inferior wall.

## 2019-08-19 NOTE — Assessment & Plan Note (Signed)
Has multivessel CAD with CABG and then already known occluded grafts and mostly native to the RCA system.  Thankfully, no active angina symptoms suggesting his other grafts are patent.    He is on aspirin plus Plavix (okay to hold for procedures 5-7 days preop)  On stable dose of statin, beta-blocker and ACE inhibitor.

## 2019-08-19 NOTE — Assessment & Plan Note (Signed)
Blood pressures well controlled on current meds.  No change.  Stable for CAD.

## 2019-08-19 NOTE — Assessment & Plan Note (Signed)
Obesity, hypertension and dyslipidemia with high triglycerides.  Discussed how this places him at higher overall risk which goes along with his existing CAD.  Need to closely monitor his renal function as well as glycemic control.

## 2019-08-19 NOTE — Assessment & Plan Note (Signed)
EF improved almost all to 50 to 55% after recurrent MI.  No active heart failure symptoms.  Feels well.  Euvolemic on exam.  Is really only on low-dose Lopressor and Zestoretic (ACE inhibitor-HCTZ combo).  Other than low-dose HCTZ, not requiring diuretic.

## 2019-08-19 NOTE — Assessment & Plan Note (Signed)
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  He says he only needs a letter, so we will try to see him back closer to that time in order to ensure that he is not having any active symptoms.  If necessary, we could check a stress test.  As it stands, there would be no clinical reason to check a stress test as he is doing well with no major angina or heart failure symptoms.

## 2019-08-19 NOTE — Assessment & Plan Note (Signed)
Again talked importance of dietary modification and increased exercise.  Difficult given his current line of work, but I would recommend that he tries get out and do some walking in between runs.

## 2019-09-17 ENCOUNTER — Other Ambulatory Visit: Payer: Self-pay | Admitting: Family

## 2019-09-17 DIAGNOSIS — I10 Essential (primary) hypertension: Secondary | ICD-10-CM

## 2019-10-26 ENCOUNTER — Other Ambulatory Visit: Payer: Self-pay | Admitting: Cardiology

## 2019-11-02 ENCOUNTER — Other Ambulatory Visit: Payer: Self-pay | Admitting: Cardiology

## 2019-11-10 ENCOUNTER — Other Ambulatory Visit: Payer: Self-pay | Admitting: Cardiology

## 2019-12-26 ENCOUNTER — Other Ambulatory Visit: Payer: Self-pay | Admitting: Family

## 2019-12-26 DIAGNOSIS — I10 Essential (primary) hypertension: Secondary | ICD-10-CM

## 2020-01-19 ENCOUNTER — Encounter: Payer: Self-pay | Admitting: Family

## 2020-01-19 ENCOUNTER — Other Ambulatory Visit: Payer: Self-pay

## 2020-01-19 ENCOUNTER — Ambulatory Visit (INDEPENDENT_AMBULATORY_CARE_PROVIDER_SITE_OTHER): Payer: BC Managed Care – PPO | Admitting: Family

## 2020-01-19 VITALS — BP 137/82 | HR 64 | Temp 98.4°F | Ht 68.0 in | Wt 247.8 lb

## 2020-01-19 DIAGNOSIS — Z1212 Encounter for screening for malignant neoplasm of rectum: Secondary | ICD-10-CM

## 2020-01-19 DIAGNOSIS — Z1211 Encounter for screening for malignant neoplasm of colon: Secondary | ICD-10-CM

## 2020-01-19 DIAGNOSIS — Z87891 Personal history of nicotine dependence: Secondary | ICD-10-CM | POA: Diagnosis not present

## 2020-01-19 DIAGNOSIS — E8881 Metabolic syndrome: Secondary | ICD-10-CM

## 2020-01-19 DIAGNOSIS — M109 Gout, unspecified: Secondary | ICD-10-CM

## 2020-01-19 DIAGNOSIS — I2510 Atherosclerotic heart disease of native coronary artery without angina pectoris: Secondary | ICD-10-CM | POA: Diagnosis not present

## 2020-01-19 DIAGNOSIS — E785 Hyperlipidemia, unspecified: Secondary | ICD-10-CM | POA: Diagnosis not present

## 2020-01-19 DIAGNOSIS — Z951 Presence of aortocoronary bypass graft: Secondary | ICD-10-CM

## 2020-01-19 DIAGNOSIS — I10 Essential (primary) hypertension: Secondary | ICD-10-CM | POA: Diagnosis not present

## 2020-01-19 MED ORDER — LISINOPRIL-HYDROCHLOROTHIAZIDE 20-12.5 MG PO TABS: 2.0000 | ORAL_TABLET | Freq: Every day | ORAL | 3 refills | Status: DC

## 2020-01-19 NOTE — Patient Instructions (Signed)
Gout  Gout is a condition that causes painful swelling of the joints. Gout is a type of inflammation of the joints (arthritis). This condition is caused by having too much uric acid in the body. Uric acid is a chemical that forms when the body breaks down substances called purines. Purines are important for building body proteins. When the body has too much uric acid, sharp crystals can form and build up inside the joints. This causes pain and swelling. Gout attacks can happen quickly and may be very painful (acute gout). Over time, the attacks can affect more joints and become more frequent (chronic gout). Gout can also cause uric acid to build up under the skin and inside the kidneys. What are the causes? This condition is caused by too much uric acid in your blood. This can happen because:  Your kidneys do not remove enough uric acid from your blood. This is the most common cause.  Your body makes too much uric acid. This can happen with some cancers and cancer treatments. It can also occur if your body is breaking down too many red blood cells (hemolytic anemia).  You eat too many foods that are high in purines. These foods include organ meats and some seafood. Alcohol, especially beer, is also high in purines. A gout attack may be triggered by trauma or stress. What increases the risk? You are more likely to develop this condition if you:  Have a family history of gout.  Are male and middle-aged.  Are male and have gone through menopause.  Are obese.  Frequently drink alcohol, especially beer.  Are dehydrated.  Lose weight too quickly.  Have an organ transplant.  Have lead poisoning.  Take certain medicines, including aspirin, cyclosporine, diuretics, levodopa, and niacin.  Have kidney disease.  Have a skin condition called psoriasis. What are the signs or symptoms? An attack of acute gout happens quickly. It usually occurs in just one joint. The most common place is  the big toe. Attacks often start at night. Other joints that may be affected include joints of the feet, ankle, knee, fingers, wrist, or elbow. Symptoms of this condition may include:  Severe pain.  Warmth.  Swelling.  Stiffness.  Tenderness. The affected joint may be very painful to touch.  Shiny, red, or purple skin.  Chills and fever. Chronic gout may cause symptoms more frequently. More joints may be involved. You may also have white or yellow lumps (tophi) on your hands or feet or in other areas near your joints. How is this diagnosed? This condition is diagnosed based on your symptoms, medical history, and physical exam. You may have tests, such as:  Blood tests to measure uric acid levels.  Removal of joint fluid with a thin needle (aspiration) to look for uric acid crystals.  X-rays to look for joint damage. How is this treated? Treatment for this condition has two phases: treating an acute attack and preventing future attacks. Acute gout treatment may include medicines to reduce pain and swelling, including:  NSAIDs.  Steroids. These are strong anti-inflammatory medicines that can be taken by mouth (orally) or injected into a joint.  Colchicine. This medicine relieves pain and swelling when it is taken soon after an attack. It can be given by mouth or through an IV. Preventive treatment may include:  Daily use of smaller doses of NSAIDs or colchicine.  Use of a medicine that reduces uric acid levels in your blood.  Changes to your diet. You may   need to see a dietitian about what to eat and drink to prevent gout. Follow these instructions at home: During a gout attack   If directed, put ice on the affected area: ? Put ice in a plastic bag. ? Place a towel between your skin and the bag. ? Leave the ice on for 20 minutes, 2-3 times a day.  Raise (elevate) the affected joint above the level of your heart as often as possible.  Rest the joint as much as possible.  If the affected joint is in your leg, you may be given crutches to use.  Follow instructions from your health care provider about eating or drinking restrictions. Avoiding future gout attacks  Follow a low-purine diet as told by your dietitian or health care provider. Avoid foods and drinks that are high in purines, including liver, kidney, anchovies, asparagus, herring, mushrooms, mussels, and beer.  Maintain a healthy weight or lose weight if you are overweight. If you want to lose weight, talk with your health care provider. It is important that you do not lose weight too quickly.  Start or maintain an exercise program as told by your health care provider. Eating and drinking  Drink enough fluids to keep your urine pale yellow.  If you drink alcohol: ? Limit how much you use to:  0-1 drink a day for women.  0-2 drinks a day for men. ? Be aware of how much alcohol is in your drink. In the [place] one drink equals one 12 oz bottle of beer (355 mL) one 5 oz glass of wine (148 mL), or one 1 oz glass of hard liquor (44 mL). General instructions  Take over-the-counter and prescription medicines only as told by your health care provider.  Do not drive or use heavy machinery while taking prescription pain medicine.  Return to your normal activities as told by your health care provider. Ask your health care provider what activities are safe for you.  Keep all follow-up visits as told by your health care provider. This is important. Contact a health care provider if you have:  Another gout attack.  Continuing symptoms of a gout attack after 10 days of treatment.  Side effects from your medicines.  Chills or a fever.  Burning pain when you urinate.  Pain in your lower back or belly. Get help right away if you:  Have severe or uncontrolled pain.  Cannot urinate. Summary  Gout is painful swelling of the joints caused by inflammation.  The most common site of pain is the big  toe, but it can affect other joints in the body.  Medicines and dietary changes can help to prevent and treat gout attacks. This information is not intended to replace advice given to you by your health care provider. Make sure you discuss any questions you have with your health care provider. Document Revised: 03/13/2018 Document Reviewed: 03/13/2018 Elsevier Patient Education  2020 Elsevier Inc.  

## 2020-01-19 NOTE — Progress Notes (Signed)
Subjective:    Patient ID: Parker Sellers, male    DOB: 1961-09-01, 59 y.o.   MRN: [id number]  Chief Complaint  Patient presents with  . Medical Management of Chronic Issues  . Gout    wants uric acid level    PT presents to  the office today for chronic follow up. PT is followed by Cardiologists every73month after his MI on 05/12/17. Hypertension This is a chronic problem. The current episode started more than 1 year ago. The problem has been resolved since onset. The problem is controlled. Associated symptoms include malaise/fatigue. Pertinent negatives include no peripheral edema or shortness of breath. Risk factors for coronary artery disease include dyslipidemia, obesity and male gender. The current treatment provides moderate improvement. Hypertensive end-organ damage includes CAD/MI.  Hyperlipidemia This is a chronic problem. The current episode started more than 1 year ago. The problem is controlled. Recent lipid tests were reviewed and are normal. Exacerbating diseases include obesity. Pertinent negatives include no shortness of breath. Current antihyperlipidemic treatment includes statins. The current treatment provides moderate improvement of lipids. Risk factors for coronary artery disease include dyslipidemia, male sex, hypertension and a sedentary lifestyle.  Metabolic Syndrome PT does not do any scheduling exercise. Tries to eat healthy. Gout Pt had gout 12/26/19 for the first time in his left great toe. Was given Colchicine.     Review of Systems  Constitutional: Positive for malaise/fatigue.  Respiratory: Negative for shortness of breath.   All other systems reviewed and are negative.      Objective:   Physical Exam Vitals reviewed.  Constitutional:      General: He is not in acute distress.    Appearance: He is well-developed. He is obese.  HENT:     Head: Normocephalic.     Right Ear: Tympanic membrane normal.     Left Ear: Tympanic membrane normal.    Eyes:     General:        Right eye: No discharge.        Left eye: No discharge.     Pupils: Pupils are equal, round, and reactive to light.  Neck:     Thyroid: No thyromegaly.  Cardiovascular:     Rate and Rhythm: Normal rate and regular rhythm.     Heart sounds: Normal heart sounds. No murmur.  Pulmonary:     Effort: Pulmonary effort is normal. No respiratory distress.     Breath sounds: Normal breath sounds. No wheezing.  Abdominal:     General: Bowel sounds are normal. There is no distension.     Palpations: Abdomen is soft.     Tenderness: There is no abdominal tenderness.  Musculoskeletal:        General: No tenderness. Normal range of motion.     Cervical back: Normal range of motion and neck supple.  Skin:    General: Skin is warm and dry.     Findings: No erythema or rash.  Neurological:     Mental Status: He is alert and oriented to person, place, and time.     Cranial Nerves: No cranial nerve deficit.     Deep Tendon Reflexes: Reflexes are normal and symmetric.  Psychiatric:        Behavior: Behavior normal.        Thought Content: Thought content normal.        Judgment: Judgment normal.     BP 137/82   Pulse 64   Temp 98.4 F (36.9 C) (Temporal)  Ht '5\' 8"'$  (1.727 m)   Wt 247 lb 12.8 oz (112.4 kg)   SpO2 97%   BMI 37.68 kg/m      Assessment & Plan:  Parker Sellers comes in today with chief complaint of Medical Management of Chronic Issues and Gout (wants uric acid level )   Diagnosis and orders addressed:  1. Hypertension, unspecified type - lisinopril-hydrochlorothiazide (ZESTORETIC) 20-12.5 MG tablet; Take 2 tablets by mouth daily. (Needs to be seen before next refill)  Dispense: 90 tablet; Refill: 3 - CMP14+EGFR - CBC with Differential/Platelet  2. Essential hypertension - CMP14+EGFR - CBC with Differential/Platelet  3. Coronary artery disease involving native coronary artery of native heart without angina pectoris - CMP14+EGFR - CBC  with Differential/Platelet - Lipid panel  4. Former smoker - CMP14+EGFR - CBC with Differential/Platelet  5. Hyperlipidemia with target low density lipoprotein (LDL) cholesterol less than 70 mg/dL - CMP14+EGFR - CBC with Differential/Platelet - Lipid panel  6. Metabolic syndrome - ZHQ60+QNVV - CBC with Differential/Platelet  7. Morbid obesity ([place]) - CMP14+EGFR - CBC with Differential/Platelet  8. S/P CABG x 5 - CMP14+EGFR - CBC with Differential/Platelet  9. Colon cancer screening - Cologuard - CMP14+EGFR - CBC with Differential/Platelet  10. Screening for malignant neoplasm of the rectum - Cologuard - CMP14+EGFR - CBC with Differential/Platelet  11. Acute gout involving toe of left foot, unspecified cause Low purine diet Force fluids - CMP14+EGFR - CBC with Differential/Platelet - Uric acid   Labs pending Health Maintenance reviewed Diet and exercise encouraged  Follow up plan: 6 months    Evelina Dun, FNP

## 2020-01-20 LAB — CBC WITH DIFFERENTIAL/PLATELET
Basophils Absolute: 0.1 10*3/uL (ref 0.0–0.2)
Basos: 1 %
EOS (ABSOLUTE): 0.5 10*3/uL — ABNORMAL HIGH (ref 0.0–0.4)
Eos: 6 %
Hematocrit: 49.8 % (ref 37.5–51.0)
Hemoglobin: 17.4 g/dL (ref 13.0–17.7)
Immature Grans (Abs): 0 10*3/uL (ref 0.0–0.1)
Immature Granulocytes: 0 %
Lymphocytes Absolute: 2 10*3/uL (ref 0.7–3.1)
Lymphs: 26 %
MCH: 31.5 pg (ref 26.6–33.0)
MCHC: 34.9 g/dL (ref 31.5–35.7)
MCV: 90 fL (ref 79–97)
Monocytes Absolute: 0.7 10*3/uL (ref 0.1–0.9)
Monocytes: 9 %
Neutrophils Absolute: 4.7 10*3/uL (ref 1.4–7.0)
Neutrophils: 58 %
Platelets: 219 10*3/uL (ref 150–450)
RBC: 5.53 x10E6/uL (ref 4.14–5.80)
RDW: 13.2 % (ref 11.6–15.4)
WBC: 8 10*3/uL (ref 3.4–10.8)

## 2020-01-20 LAB — LIPID PANEL
Chol/HDL Ratio: 4.1 ratio (ref 0.0–5.0)
Cholesterol, Total: 127 mg/dL (ref 100–199)
HDL: 31 mg/dL — ABNORMAL LOW (ref >39)
LDL Chol Calc (NIH): 65 mg/dL (ref 0–99)
Triglycerides: 184 mg/dL — ABNORMAL HIGH (ref 0–149)
VLDL Cholesterol Cal: 31 mg/dL (ref 5–40)

## 2020-01-20 LAB — CMP14+EGFR
ALT: 24 IU/L (ref 0–44)
AST: 17 IU/L (ref 0–40)
Albumin/Globulin Ratio: 1.7 (ref 1.2–2.2)
Albumin: 4.7 g/dL (ref 3.8–4.9)
Alkaline Phosphatase: 87 IU/L (ref 48–121)
BUN/Creatinine Ratio: 13 (ref 9–20)
BUN: 18 mg/dL (ref 6–24)
Bilirubin Total: 0.5 mg/dL (ref 0.0–1.2)
CO2: 22 mmol/L (ref 20–29)
Calcium: 10.2 mg/dL (ref 8.7–10.2)
Chloride: 102 mmol/L (ref 96–106)
Creatinine, Ser: 1.37 mg/dL — ABNORMAL HIGH (ref 0.76–1.27)
GFR calc Af Amer: 65 mL/min/{1.73_m2} (ref >59)
GFR calc non Af Amer: 56 mL/min/{1.73_m2} — ABNORMAL LOW (ref >59)
Globulin, Total: 2.7 g/dL (ref 1.5–4.5)
Glucose: 107 mg/dL — ABNORMAL HIGH (ref 65–99)
Potassium: 4.6 mmol/L (ref 3.5–5.2)
Sodium: 138 mmol/L (ref 134–144)
Total Protein: 7.4 g/dL (ref 6.0–8.5)

## 2020-01-20 LAB — URIC ACID: Uric Acid: 9 mg/dL — ABNORMAL HIGH (ref 3.8–8.4)

## 2020-02-11 ENCOUNTER — Other Ambulatory Visit: Payer: Self-pay | Admitting: Cardiology

## 2020-02-24 ENCOUNTER — Encounter: Payer: Self-pay | Admitting: Physician Assistant

## 2020-02-24 ENCOUNTER — Other Ambulatory Visit: Payer: Self-pay

## 2020-02-24 ENCOUNTER — Ambulatory Visit (INDEPENDENT_AMBULATORY_CARE_PROVIDER_SITE_OTHER): Payer: 59 | Admitting: Physician Assistant

## 2020-02-24 VITALS — BP 120/76 | HR 53 | Temp 97.8°F | Ht 68.0 in | Wt 247.0 lb

## 2020-02-24 DIAGNOSIS — Z951 Presence of aortocoronary bypass graft: Secondary | ICD-10-CM

## 2020-02-24 DIAGNOSIS — I2129 ST elevation (STEMI) myocardial infarction involving other sites: Secondary | ICD-10-CM | POA: Diagnosis not present

## 2020-02-24 DIAGNOSIS — I10 Essential (primary) hypertension: Secondary | ICD-10-CM

## 2020-02-24 DIAGNOSIS — E785 Hyperlipidemia, unspecified: Secondary | ICD-10-CM

## 2020-02-24 DIAGNOSIS — I255 Ischemic cardiomyopathy: Secondary | ICD-10-CM

## 2020-02-24 DIAGNOSIS — I2510 Atherosclerotic heart disease of native coronary artery without angina pectoris: Secondary | ICD-10-CM

## 2020-02-24 NOTE — Patient Instructions (Signed)
Medication Instructions:  Your physician recommends that you continue on your current medications as directed. Please refer to the Current Medication list given to you today.  *If you need a refill on your cardiac medications before your next appointment, please call your pharmacy*   Follow-Up: At [facility], you and your health needs are our priority.  As part of our continuing mission to provide you with exceptional heart care, we have created designated Provider Care Teams.  These Care Teams include your primary Cardiologist (physician) and Advanced Practice Providers (APPs -  Physician Assistants and Nurse Practitioners) who all work together to provide you with the care you need, when you need it.  We recommend signing up for the patient portal called "MyChart".  Sign up information is provided on this After Visit Summary.  MyChart is used to connect with patients for Virtual Visits (Telemedicine).  Patients are able to view lab/test results, encounter notes, upcoming appointments, etc.  Non-urgent messages can be sent to your provider as well.   To learn more about what you can do with MyChart, go to [email or web address].    Your next appointment:   6-12 month(s)  The format for your next appointment:   In Person  Provider:   You may see Bryan Lemma, MD or one of the following Advanced Practice Providers on your designated Care Team:    Theodore Demark, PA-C  Joni Reining, DNP, ANP  Cadence Fransico Michael, NP    Other Instructions  We will send your letter to DOT.  Please call our office 2 months in advance to schedule your follow-up appointment with Dr. Herbie Baltimore.

## 2020-02-24 NOTE — Progress Notes (Signed)
Cardiology Office Note   Date:  02/24/2020   ID:  Parker Sellers, DOB 1961/07/26, MRN [id number]  PCP:  Junie Spencer, FNP Cardiologist:  Bryan Lemma, MD 08/19/2019 Electrphysiologist: None Theodore Demark, PA-C   No chief complaint on file.   History of Present Illness: Parker Sellers is a 59 y.o. male with a history of CAD 2018>CABG x 5, HTN, HLD, morbid obesity, gout, ICM w/ EF 35%>>55%, STEMI 2019 ([place]) w/ RCA & SVG-PD-PL 100%, OSA on CPAP  Parker Sellers presents for cardiology follow up.   He gets activity w/ hunting, yard work. 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  He does not get chest pain or SOB w/ exertion. He got a little light-headed the other day at work when working in 90+ degree heat. This resolved w/ hydration.   He is compliant w/ CPAP.  Denies orthopnea or LE edema.   He denies palpitations, no presyncope or syncope.   He does not feel at all limited in his activity.  He has not smoked in more than 20 years.   Past Medical History:  Diagnosis Date  . Coronary artery disease involving native coronary artery of native heart with unstable angina pectoris (HCC) 05/22/2017   Cath 05/12/2017 (aborted inferior STEMI) - Ost LM 55%, d (apical) LAD 100%, D1 100% (chronic), Om1 70%, mRCA 75% ulcerated lesion (culprit) - dRCA 55%, ostrPDA 60% & rPDA 60%. RPAV 70%, PAV2 95%. Difficult Radial Access - tortuous innominate artery.;; -->  Status post recurrent MI with occlusion of graft to the right system with negative RCA occlusion  . Hyperlipidemia   . Hypertension   . Ischemic cardiomyopathy 05/12/2017   EF by LV Gram 35-45%; Post MI Echo 45%, OR TEE 35-45%.  Gr 2 DD. -->  Initially resolved as of September 24, 2017.  EF 50-55%.  . S/P CABG x 5 05/14/2017   LIMA-LAD, seqSVG-OM1-OM2, free r-Rad-rPDA  . Sleep apnea    home CPAP  . ST elevation myocardial infarction (STEMI) of lateral wall, initial episode of care [facility] 05/12/2017     Recurrent MI -February 2019 [facility]) -native RCA occluded along with vein graft to RPDA-RPL unable to wire.  . Tobacco use 05/12/2017    Past Surgical History:  Procedure Laterality Date  . CORONARY ARTERY BYPASS GRAFT N/A 05/14/2017   Procedure: CORONARY ARTERY BYPASS GRAFTING (CABG)/EVH x 5 five using bilateral internal mammary arteries and left saphenous vein.;  Surgeon: Loreli Slot, MD;  Location: [facility] OR;  Service: Open Heart Surgery(::) LIMA-LAD, seqSVG-OM1-OM2, free r-Rad-rPDA  . HERNIA REPAIR    . KNEE SURGERY Left    Torn meniscus  . LEFT HEART CATH AND CORONARY ANGIOGRAPHY N/A 05/12/2017   Procedure: LEFT HEART CATH AND CORONARY ANGIOGRAPHY;  Surgeon: Marykay Lex, MD;  Location: [facility] INVASIVE CV LAB: MV CAD - ostLM 55%, dLAD 100%, D1 100%, OM 1 70%, mRCA 75%-dRCA 55%, ostPDA 60% - PDA 60^, PL1 70%, PK2 95%.  Mod-Severe LV Dysfunction - ~EF 35-45%  . LEFT HEART CATH AND CORS/GRAFTS ANGIOGRAPHY  10/30/2017   [facility] [place] : LIMA-LAD patent, graft to OM-OM patent (in chart listed as SVG but this is actually the free RIMA), occluded SVG-RPDA-RPL -->  Native RCA is totally closed.  EF 40%.  ->  Attempt intervention on the RIMA  as it comes off the innominate artery was obviously unsuccessful as the vessel have been removed and attached  to the aorta.  . TEE WITHOUT CARDIOVERSION N/A 05/14/2017   Procedure: TRANSESOPHAGEAL ECHOCARDIOGRAM (TEE);  Surgeon: Melrose Nakayama, MD;  Location: [place];  Service: Open Heart Surgery: Mod Concentric LVH. EF ~35-40%.  Inferior hypokinesis & inferolateral hypokinesis. Dilated MV & TV annuli with only trace MR/TR.  Mild RV dilation with moderately reduced function.  . TRANSTHORACIC ECHOCARDIOGRAM  05/13/2017   Mod LVH. Mildly reduced LVED ~45-50%. Akinesis of inferolateral & inferoseptal walls. Gr 2 DD. Mild Ascending Aortic dilation ~3.8 cm.  . TRANSTHORACIC ECHOCARDIOGRAM  1/'19 & 2/'29   Mild LV dilation with  mild increased thickness (mild LVH).  Low-normal function with a EF of 50-55%.  No regional wall motion normalities noted. -->  Echocardiogram done in [facility] in [place] (10/2017 - Inf STEMI with occluded RCA + Grafts) also showed EF 50 to 50% with no obvious wall motion abnormality, technically difficult study.    Current Outpatient Medications  Medication Sig Dispense Refill  . aspirin EC 81 MG tablet Take 1 tablet (81 mg total) by mouth daily. 90 tablet 3  . atorvastatin (LIPITOR) 40 MG tablet Take 1 tablet by mouth once daily 90 tablet 2  . clopidogrel (PLAVIX) 75 MG tablet Take 1 tablet by mouth once daily 90 tablet 0  . colchicine 0.6 MG tablet Take one po bid prn gout flare    . lisinopril-hydrochlorothiazide (ZESTORETIC) 20-12.5 MG tablet Take 2 tablets by mouth daily. (Needs to be seen before next refill) 90 tablet 3  . metoprolol tartrate (LOPRESSOR) 25 MG tablet TAKE 1 & 1/2 (ONE & ONE-HALF) TABLETS BY MOUTH TWICE DAILY 270 tablet 2  . Multiple Vitamin (MULTIVITAMIN WITH MINERALS) TABS tablet Take 1 tablet by mouth daily.    . Omega-3 Fatty Acids (FISH OIL) 1000 MG CAPS Take by mouth.     No current facility-administered medications for this visit.    Allergies:   Patient has no known allergies.    Social History:  The patient  reports that he has quit smoking. His smokeless tobacco use includes snuff and chew. He reports current alcohol use of about 2.0 standard drinks of alcohol per week. He reports that he does not use drugs.   Family History:  The patient's family history includes AAA (abdominal aortic aneurysm) in his father; Alcohol abuse in his father; Cancer in his mother; Coronary artery disease in his brother; Hypertension in his father; Liver disease in his father.  He indicated that his mother is deceased. He indicated that his father is deceased. He indicated that his brother is alive.  ROS:  Please see the history of present illness. All other  systems are reviewed and negative.    PHYSICAL EXAM: VS:  BP 120/76   Pulse (!) 53   Temp 97.8 F (36.6 C)   Ht 5\' 8"  (1.727 m)   Wt 247 lb (112 kg)   SpO2 95%   BMI 37.56 kg/m  , BMI Body mass index is 37.56 kg/m. GEN: Well nourished, well developed, male in no acute distress HEENT: normal for age  Neck: no JVD, no carotid bruit, no masses Cardiac: RRR; no murmur, no rubs, or gallops Respiratory:  clear to auscultation bilaterally, normal work of breathing GI: soft, nontender, nondistended, + BS MS: no deformity or atrophy; no edema; distal pulses are 2+ in all 4 extremities  Skin: warm and dry, no rash Neuro:  Strength and sensation are intact Psych: euthymic mood, full affect   EKG:  EKG  is ordered today. The ekg ordered today demonstrates sinus bradycardia, heart rate 53, possible left atrial enlargement, Q waves in lead III only, no significant change from 08/20/2019  ECHO: 09/24/2017 - Left ventricle: The cavity size was mildly dilated. Wall  thickness was increased in a pattern of mild LVH. Systolic  function was normal. The estimated ejection fraction was in the  range of 50% to 55%.  - Aortic valve: There was trivial regurgitation.  - Left atrium: The atrium was mildly dilated.   CABG: 05/14/2017  LIMA-LAD, FRIMA-OM1-O2, SVG-PD-PL  Recent Labs: 01/19/2020: ALT 24; BUN 18; Creatinine, Ser 1.37; Hemoglobin 17.4; Platelets 219; Potassium 4.6; Sodium 138  CBC    Component Value Date/Time   WBC 8.0 01/19/2020 1009   WBC 14.7 (H) 06/06/2017 1741   RBC 5.53 01/19/2020 1009   RBC 4.53 06/06/2017 1741   HGB 17.4 01/19/2020 1009   HCT 49.8 01/19/2020 1009   PLT 219 01/19/2020 1009   MCV 90 01/19/2020 1009   MCH 31.5 01/19/2020 1009   MCH 30.5 06/06/2017 1741   MCHC 34.9 01/19/2020 1009   MCHC 34.3 06/06/2017 1741   RDW 13.2 01/19/2020 1009   LYMPHSABS 2.0 01/19/2020 1009   MONOABS 0.8 06/06/2017 1741   EOSABS 0.5 (H) 01/19/2020 1009   BASOSABS 0.1  01/19/2020 1009   CMP Latest Ref Rng & Units 01/19/2020 12/30/2018 06/24/2018  Glucose 65 - 99 mg/dL 017(B) 939(Q) 300(P)  BUN 6 - 24 mg/dL 18 18 20   Creatinine 0.76 - 1.27 mg/dL ) 2.33(A) 0.76(A)  Sodium 134 - 144 mmol/L 138 139 139  Potassium 3.5 - 5.2 mmol/L 4.6 4.4 4.3  Chloride 96 - 106 mmol/L 102 101 101  CO2 20 - 29 mmol/L 22 19(L) 23  Calcium 8.7 - 10.2 mg/dL 2.63(F 9.8 9.6  Total Protein 6.0 - 8.5 g/dL 7.4 7.4 7.1  Total Bilirubin 0.0 - 1.2 mg/dL 0.5 0.5 0.3  Alkaline Phos 48 - 121 IU/L 87 93 83  AST 0 - 40 IU/L 17 21 16   ALT 0 - 44 IU/L 24 27 19      Lipid Panel Lab Results  Component Value Date   CHOL 127 01/19/2020   HDL 31 (L) 01/19/2020   LDLCALC 65 01/19/2020   TRIG 184 (H) 01/19/2020   CHOLHDL 4.1 01/19/2020      Wt Readings from Last 3 Encounters:  02/24/20 247 lb (112 kg)  01/19/20 247 lb 12.8 oz (112.4 kg)  08/18/19 249 lb (112.9 kg)     Other studies Reviewed: Additional studies/ records that were reviewed today include: Office notes, hospital records and testing.  ASSESSMENT AND PLAN:  1.  CAD, history of CABG with subsequent STEMI: -He has a substrate for angina with an occluded RCA and his SVG-PDA-PL was occluded at cath in 2019. -However, he has a good activity level without any ischemic symptoms. -No cardiac testing is needed -He is okay to drive for the DOT  2. Remote tobacco use: -He has not smoked in over 20 years   3. Hypertension: -His blood pressure is well controlled on current therapy -His heart rate is in the 50s, but he is asymptomatic with this, continue beta-blocker at current dose  4. HLD: -His PCP follows his labs. -His LDL is at goal. His triglycerides were elevated at one time, but are now only minimally above goal.  5. Ischemic cardiomyopathy: -His EF was as low as 35% at the time of his initial MI in 2018, pre-CABG -His EF had  improved to 55% at echo in 2019. -Continue lisinopril/HCTZ and beta-blocker  6.  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  Current medicines are reviewed at length with the patient today.  The patient does not have concerns regarding medicines.  The following changes have been made:  no change  Labs/ tests ordered today include:   Orders Placed This Encounter  Procedures  . EKG 12-Lead     Disposition:   FU with Bryan Lemma, MD  Signed, Theodore Demark, PA-C  02/24/2020 9:25 AM    [facility] Phone: [phone]; Fax: [phone]

## 2020-03-09 ENCOUNTER — Telehealth: Payer: Self-pay | Admitting: Cardiology

## 2020-03-09 NOTE — Telephone Encounter (Signed)
Spoke with pt, last office note faxed to his attention at number provided.

## 2020-03-09 NOTE — Telephone Encounter (Signed)
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 Please assist with this request and call patient once it has been faxed to confirm.

## 2020-05-04 ENCOUNTER — Telehealth: Payer: Self-pay | Admitting: Family

## 2020-05-06 MED ORDER — COLCHICINE 0.6 MG PO TABS: ORAL_TABLET | ORAL | 2 refills | Status: DC

## 2020-05-06 NOTE — Telephone Encounter (Signed)
Patient aware and verbalized understanding. °

## 2020-05-06 NOTE — Telephone Encounter (Signed)
Colchicine Prescription sent to pharmacy

## 2020-05-23 ENCOUNTER — Other Ambulatory Visit: Payer: Self-pay | Admitting: Cardiology

## 2020-07-26 ENCOUNTER — Encounter: Payer: Self-pay | Admitting: Family

## 2020-07-26 ENCOUNTER — Other Ambulatory Visit: Payer: Self-pay

## 2020-07-26 ENCOUNTER — Ambulatory Visit (INDEPENDENT_AMBULATORY_CARE_PROVIDER_SITE_OTHER): Payer: PRIVATE HEALTH INSURANCE | Admitting: Family

## 2020-07-26 VITALS — BP 125/85 | HR 59 | Temp 97.9°F | Ht 68.0 in | Wt 249.6 lb

## 2020-07-26 DIAGNOSIS — Z951 Presence of aortocoronary bypass graft: Secondary | ICD-10-CM | POA: Diagnosis not present

## 2020-07-26 DIAGNOSIS — Z87891 Personal history of nicotine dependence: Secondary | ICD-10-CM

## 2020-07-26 DIAGNOSIS — I2510 Atherosclerotic heart disease of native coronary artery without angina pectoris: Secondary | ICD-10-CM

## 2020-07-26 DIAGNOSIS — Z1211 Encounter for screening for malignant neoplasm of colon: Secondary | ICD-10-CM

## 2020-07-26 DIAGNOSIS — Z23 Encounter for immunization: Secondary | ICD-10-CM

## 2020-07-26 DIAGNOSIS — I10 Essential (primary) hypertension: Secondary | ICD-10-CM

## 2020-07-26 DIAGNOSIS — E785 Hyperlipidemia, unspecified: Secondary | ICD-10-CM | POA: Diagnosis not present

## 2020-07-26 DIAGNOSIS — M109 Gout, unspecified: Secondary | ICD-10-CM

## 2020-07-26 LAB — CMP14+EGFR
ALT: 29 IU/L (ref 0–44)
AST: 18 IU/L (ref 0–40)
Albumin/Globulin Ratio: 1.7 (ref 1.2–2.2)
Albumin: 4.6 g/dL (ref 3.8–4.9)
Alkaline Phosphatase: 90 IU/L (ref 44–121)
BUN/Creatinine Ratio: 12 (ref 10–24)
BUN: 17 mg/dL (ref 8–27)
Bilirubin Total: 0.5 mg/dL (ref 0.0–1.2)
CO2: 21 mmol/L (ref 20–29)
Calcium: 9.6 mg/dL (ref 8.6–10.2)
Chloride: 98 mmol/L (ref 96–106)
Creatinine, Ser: 1.39 mg/dL — ABNORMAL HIGH (ref 0.76–1.27)
GFR calc Af Amer: 63 mL/min/{1.73_m2} (ref >59)
GFR calc non Af Amer: 55 mL/min/{1.73_m2} — ABNORMAL LOW (ref >59)
Globulin, Total: 2.7 g/dL (ref 1.5–4.5)
Glucose: 102 mg/dL — ABNORMAL HIGH (ref 65–99)
Potassium: 4.4 mmol/L (ref 3.5–5.2)
Sodium: 134 mmol/L (ref 134–144)
Total Protein: 7.3 g/dL (ref 6.0–8.5)

## 2020-07-26 MED ORDER — LISINOPRIL-HYDROCHLOROTHIAZIDE 20-12.5 MG PO TABS: 2.0000 | ORAL_TABLET | Freq: Every day | ORAL | 3 refills | Status: DC

## 2020-07-26 MED ORDER — METOPROLOL TARTRATE 25 MG PO TABS: ORAL_TABLET | ORAL | 2 refills | Status: DC

## 2020-07-26 MED ORDER — CLOPIDOGREL BISULFATE 75 MG PO TABS: 75.0000 mg | ORAL_TABLET | Freq: Every day | ORAL | 3 refills | Status: DC

## 2020-07-26 MED ORDER — ATORVASTATIN CALCIUM 40 MG PO TABS: 40.0000 mg | ORAL_TABLET | Freq: Every day | ORAL | 2 refills | Status: DC

## 2020-07-26 NOTE — Progress Notes (Signed)
Subjective:    Patient ID: Parker Sellers, male    DOB: [birth year], 59 y.o.   MRN: [id number]  Chief Complaint  Patient presents with  . Hypertension    6 mth, no concerns, fasting    PT presents to  the office today for chronic follow up.PT is followed by Cardiologists annually after his MI on 05/12/17. Hypertension This is a chronic problem. The current episode started more than 1 year ago. The problem has been resolved since onset. The problem is controlled. Associated symptoms include malaise/fatigue. Pertinent negatives include no peripheral edema or shortness of breath. Risk factors for coronary artery disease include dyslipidemia, obesity and sedentary lifestyle. The current treatment provides moderate improvement. Hypertensive end-organ damage includes CAD/MI.  Hyperlipidemia This is a chronic problem. The current episode started more than 1 year ago. Exacerbating diseases include obesity. Pertinent negatives include no shortness of breath. Current antihyperlipidemic treatment includes statins. The current treatment provides moderate improvement of lipids. Risk factors for coronary artery disease include dyslipidemia, male sex and hypertension.  Gout Pt takes colchicine as needed. States he has had two flare ups this year.    Review of Systems  Constitutional: Positive for malaise/fatigue.  Respiratory: Negative for shortness of breath.   All other systems reviewed and are negative.      Objective:   Physical Exam Vitals reviewed.  Constitutional:      General: He is not in acute distress.    Appearance: He is well-developed.  HENT:     Head: Normocephalic.     Right Ear: Tympanic membrane normal.     Left Ear: Tympanic membrane normal.  Eyes:     General:        Right eye: No discharge.        Left eye: No discharge.     Pupils: Pupils are equal, round, and reactive to light.  Neck:     Thyroid: No thyromegaly.  Cardiovascular:     Rate and Rhythm: Normal  rate and regular rhythm.     Heart sounds: Normal heart sounds. No murmur heard.   Pulmonary:     Effort: Pulmonary effort is normal. No respiratory distress.     Breath sounds: Normal breath sounds. No wheezing.  Abdominal:     General: Bowel sounds are normal. There is no distension.     Palpations: Abdomen is soft.     Tenderness: There is no abdominal tenderness.  Musculoskeletal:        General: No tenderness. Normal range of motion.     Cervical back: Normal range of motion and neck supple.  Skin:    General: Skin is warm and dry.     Findings: No erythema or rash.  Neurological:     Mental Status: He is alert and oriented to person, place, and time.     Cranial Nerves: No cranial nerve deficit.     Deep Tendon Reflexes: Reflexes are normal and symmetric.  Psychiatric:        Behavior: Behavior normal.        Thought Content: Thought content normal.        Judgment: Judgment normal.       BP 128/90   Pulse (!) 59   Temp 97.9 F (36.6 C) (Temporal)   Ht _0  (1.727 m)   Wt 249 lb 9.6 oz (113.2 kg)   BMI 37.95 kg/m      Assessment & Plan:  Parker Sellers comes in today with chief  complaint of Hypertension (6 mth, no concerns, fasting )   Diagnosis and orders addressed:  1. Hypertension, unspecified type - lisinopril-hydrochlorothiazide (ZESTORETIC) 20-12.5 MG tablet; Take 2 tablets by mouth daily. (Needs to be seen before next refill)  Dispense: 90 tablet; Refill: 3 - metoprolol tartrate (LOPRESSOR) 25 MG tablet; TAKE 1 & 1/2 (ONE & ONE-HALF) TABLETS BY MOUTH TWICE DAILY  Dispense: 270 tablet; Refill: 2 - CMP14+EGFR  2. Essential hypertension - CMP14+EGFR  3. Morbid obesity ([place]) - CMP14+EGFR  4. Hyperlipidemia with target low density lipoprotein (LDL) cholesterol less than 70 mg/dL - atorvastatin (LIPITOR) 40 MG tablet; Take 1 tablet (40 mg total) by mouth daily.  Dispense: 90 tablet; Refill: 2 - CMP14+EGFR  5. S/P CABG x 5 - clopidogrel (PLAVIX)  75 MG tablet; Take 1 tablet (75 mg total) by mouth daily.  Dispense: 90 tablet; Refill: 3 - CMP14+EGFR  6. Former smoker - CMP14+EGFR  7. Coronary artery disease involving native coronary artery of native heart without angina pectoris - CMP14+EGFR  8. Colon cancer screening - Cologuard - CMP14+EGFR   9. Gout, unspecified cause, unspecified chronicity, unspecified site Continue colchicine as needed Call if symptoms worsens, and may need to start allopurinol   Labs pending Health Maintenance reviewed Diet and exercise encouraged  Follow up plan: 6 months    Evelina Dun, FNP

## 2020-07-26 NOTE — Patient Instructions (Signed)
Gout  Gout is a condition that causes painful swelling of the joints. Gout is a type of inflammation of the joints (arthritis). This condition is caused by having too much uric acid in the body. Uric acid is a chemical that forms when the body breaks down substances called purines. Purines are important for building body proteins. When the body has too much uric acid, sharp crystals can form and build up inside the joints. This causes pain and swelling. Gout attacks can happen quickly and may be very painful (acute gout). Over time, the attacks can affect more joints and become more frequent (chronic gout). Gout can also cause uric acid to build up under the skin and inside the kidneys. What are the causes? This condition is caused by too much uric acid in your blood. This can happen because:  Your kidneys do not remove enough uric acid from your blood. This is the most common cause.  Your body makes too much uric acid. This can happen with some cancers and cancer treatments. It can also occur if your body is breaking down too many red blood cells (hemolytic anemia).  You eat too many foods that are high in purines. These foods include organ meats and some seafood. Alcohol, especially beer, is also high in purines. A gout attack may be triggered by trauma or stress. What increases the risk? You are more likely to develop this condition if you:  Have a family history of gout.  Are male and middle-aged.  Are male and have gone through menopause.  Are obese.  Frequently drink alcohol, especially beer.  Are dehydrated.  Lose weight too quickly.  Have an organ transplant.  Have lead poisoning.  Take certain medicines, including aspirin, cyclosporine, diuretics, levodopa, and niacin.  Have kidney disease.  Have a skin condition called psoriasis. What are the signs or symptoms? An attack of acute gout happens quickly. It usually occurs in just one joint. The most common place is  the big toe. Attacks often start at night. Other joints that may be affected include joints of the feet, ankle, knee, fingers, wrist, or elbow. Symptoms of this condition may include:  Severe pain.  Warmth.  Swelling.  Stiffness.  Tenderness. The affected joint may be very painful to touch.  Shiny, red, or purple skin.  Chills and fever. Chronic gout may cause symptoms more frequently. More joints may be involved. You may also have white or yellow lumps (tophi) on your hands or feet or in other areas near your joints. How is this diagnosed? This condition is diagnosed based on your symptoms, medical history, and physical exam. You may have tests, such as:  Blood tests to measure uric acid levels.  Removal of joint fluid with a thin needle (aspiration) to look for uric acid crystals.  X-rays to look for joint damage. How is this treated? Treatment for this condition has two phases: treating an acute attack and preventing future attacks. Acute gout treatment may include medicines to reduce pain and swelling, including:  NSAIDs.  Steroids. These are strong anti-inflammatory medicines that can be taken by mouth (orally) or injected into a joint.  Colchicine. This medicine relieves pain and swelling when it is taken soon after an attack. It can be given by mouth or through an IV. Preventive treatment may include:  Daily use of smaller doses of NSAIDs or colchicine.  Use of a medicine that reduces uric acid levels in your blood.  Changes to your diet. You may   need to see a dietitian about what to eat and drink to prevent gout. Follow these instructions at home: During a gout attack   If directed, put ice on the affected area: ? Put ice in a plastic bag. ? Place a towel between your skin and the bag. ? Leave the ice on for 20 minutes, 2-3 times a day.  Raise (elevate) the affected joint above the level of your heart as often as possible.  Rest the joint as much as possible.  If the affected joint is in your leg, you may be given crutches to use.  Follow instructions from your health care provider about eating or drinking restrictions. Avoiding future gout attacks  Follow a low-purine diet as told by your dietitian or health care provider. Avoid foods and drinks that are high in purines, including liver, kidney, anchovies, asparagus, herring, mushrooms, mussels, and beer.  Maintain a healthy weight or lose weight if you are overweight. If you want to lose weight, talk with your health care provider. It is important that you do not lose weight too quickly.  Start or maintain an exercise program as told by your health care provider. Eating and drinking  Drink enough fluids to keep your urine pale yellow.  If you drink alcohol: ? Limit how much you use to:  0-1 drink a day for women.  0-2 drinks a day for men. ? Be aware of how much alcohol is in your drink. In the [place] one drink equals one 12 oz bottle of beer (355 mL) one 5 oz glass of wine (148 mL), or one 1 oz glass of hard liquor (44 mL). General instructions  Take over-the-counter and prescription medicines only as told by your health care provider.  Do not drive or use heavy machinery while taking prescription pain medicine.  Return to your normal activities as told by your health care provider. Ask your health care provider what activities are safe for you.  Keep all follow-up visits as told by your health care provider. This is important. Contact a health care provider if you have:  Another gout attack.  Continuing symptoms of a gout attack after 10 days of treatment.  Side effects from your medicines.  Chills or a fever.  Burning pain when you urinate.  Pain in your lower back or belly. Get help right away if you:  Have severe or uncontrolled pain.  Cannot urinate. Summary  Gout is painful swelling of the joints caused by inflammation.  The most common site of pain is the big  toe, but it can affect other joints in the body.  Medicines and dietary changes can help to prevent and treat gout attacks. This information is not intended to replace advice given to you by your health care provider. Make sure you discuss any questions you have with your health care provider. Document Revised: 03/13/2018 Document Reviewed: 03/13/2018 Elsevier Patient Education  2020 Elsevier Inc.  

## 2021-01-24 ENCOUNTER — Ambulatory Visit (INDEPENDENT_AMBULATORY_CARE_PROVIDER_SITE_OTHER): Payer: PRIVATE HEALTH INSURANCE | Admitting: Family

## 2021-01-24 ENCOUNTER — Encounter: Payer: Self-pay | Admitting: Family

## 2021-01-24 ENCOUNTER — Other Ambulatory Visit: Payer: Self-pay

## 2021-01-24 VITALS — BP 127/82 | HR 58 | Temp 97.0°F | Ht 68.0 in | Wt 253.2 lb

## 2021-01-24 DIAGNOSIS — I2510 Atherosclerotic heart disease of native coronary artery without angina pectoris: Secondary | ICD-10-CM

## 2021-01-24 DIAGNOSIS — Z87891 Personal history of nicotine dependence: Secondary | ICD-10-CM

## 2021-01-24 DIAGNOSIS — Z0000 Encounter for general adult medical examination without abnormal findings: Secondary | ICD-10-CM

## 2021-01-24 DIAGNOSIS — Z951 Presence of aortocoronary bypass graft: Secondary | ICD-10-CM

## 2021-01-24 DIAGNOSIS — I10 Essential (primary) hypertension: Secondary | ICD-10-CM

## 2021-01-24 DIAGNOSIS — E785 Hyperlipidemia, unspecified: Secondary | ICD-10-CM

## 2021-01-24 NOTE — Patient Instructions (Signed)

## 2021-01-24 NOTE — Progress Notes (Signed)
Subjective:    Patient ID: Parker Sellers, male    DOB: 02-12-1961, 60 y.o.   MRN: [id number]  Chief Complaint  Patient presents with  . Hypertension    6 mths no concerns fasting    PTpresents tothe office today for CPE.PT is followed by Cardiologists annually after his MI on 05/12/17. Hypertension This is a chronic problem. The current episode started more than 1 year ago. The problem has been resolved since onset. The problem is controlled. Pertinent negatives include no malaise/fatigue, peripheral edema or shortness of breath. Risk factors for coronary artery disease include dyslipidemia, obesity and sedentary lifestyle. The current treatment provides moderate improvement.  Hyperlipidemia This is a chronic problem. The current episode started more than 1 year ago. Exacerbating diseases include obesity. Pertinent negatives include no shortness of breath. Current antihyperlipidemic treatment includes statins. The current treatment provides moderate improvement of lipids. Risk factors for coronary artery disease include dyslipidemia, diabetes mellitus, male sex, hypertension and a sedentary lifestyle.      Review of Systems  Constitutional: Negative for malaise/fatigue.  Respiratory: Negative for shortness of breath.   All other systems reviewed and are negative.  Family History  Problem Relation Age of Onset  . Cancer Mother   . Hypertension Father   . Liver disease Father   . Alcohol abuse Father   . AAA (abdominal aortic aneurysm) Father   . Coronary artery disease Brother        stent placement age 35   Social History   Socioeconomic History  . Marital status: Single    Spouse name: Not on file  . Number of children: Not on file  . Years of education: Not on file  . Highest education level: Not on file  Occupational History  . Occupation: Wellsite geologist for [employer]  Tobacco Use  . Smoking status: Former Research scientist (life sciences)  . Smokeless tobacco: Current  User    Types: Snuff, Chew  Vaping Use  . Vaping Use: Never used  Substance and Sexual Activity  . Alcohol use: Yes    Alcohol/week: 2.0 standard drinks    Types: 2 Cans of beer per week    Comment: quit as of sept 2018  . Drug use: No    Types: Marijuana  . Sexual activity: Yes  Other Topics Concern  . Not on file  Social History Narrative  . Not on file   Social Determinants of Health   Financial Resource Strain: Not on file  Food Insecurity: Not on file  Transportation Needs: Not on file  Physical Activity: Not on file  Stress: Not on file  Social Connections: Not on file       Objective:   Physical Exam Vitals reviewed.  Constitutional:      General: He is not in acute distress.    Appearance: He is well-developed. He is obese.  HENT:     Head: Normocephalic.     Right Ear: Tympanic membrane normal.     Left Ear: Tympanic membrane normal.  Eyes:     General:        Right eye: No discharge.        Left eye: No discharge.     Pupils: Pupils are equal, round, and reactive to light.  Neck:     Thyroid: No thyromegaly.  Cardiovascular:     Rate and Rhythm: Normal rate and regular rhythm.     Heart sounds: Normal heart sounds. No murmur heard.   Pulmonary:  Effort: Pulmonary effort is normal. No respiratory distress.     Breath sounds: Normal breath sounds. No wheezing.  Abdominal:     General: Bowel sounds are normal. There is no distension.     Palpations: Abdomen is soft.     Tenderness: There is no abdominal tenderness.  Musculoskeletal:        General: No tenderness. Normal range of motion.     Cervical back: Normal range of motion and neck supple.  Skin:    General: Skin is warm and dry.     Findings: No erythema or rash.  Neurological:     Mental Status: He is alert and oriented to person, place, and time.     Cranial Nerves: No cranial nerve deficit.     Deep Tendon Reflexes: Reflexes are normal and symmetric.  Psychiatric:        Behavior:  Behavior normal.        Thought Content: Thought content normal.        Judgment: Judgment normal.       BP 127/82   Pulse (!) 58   Temp (!) 97 F (36.1 C) (Temporal)   Ht _0  (1.727 m)   Wt 253 lb 3.2 oz (114.9 kg)   BMI 38.50 kg/m      Assessment & Plan:  Parker Sellers comes in today with chief complaint of Hypertension (6 mths no concerns fasting )   Diagnosis and orders addressed:  1. Essential hypertension - CMP14+EGFR - CBC with Differential/Platelet  2. S/P CABG x 5 - CMP14+EGFR - CBC with Differential/Platelet  3. Morbid obesity ([place] - CMP14+EGFR - CBC with Differential/Platelet  4. Hyperlipidemia with target low density lipoprotein (LDL) cholesterol less than 70 mg/dL - CMP14+EGFR - CBC with Differential/Platelet - Lipid panel  5. Former smoker - CMP14+EGFR - CBC with Differential/Platelet  6. Coronary artery disease involving native coronary artery of native heart without angina pectoris - CMP14+EGFR - CBC with Differential/Platelet  7. Annual physical exam - CMP14+EGFR - CBC with Differential/Platelet - Lipid panel - TSH - PSA, total and free   Labs pending Health Maintenance reviewed Diet and exercise encouraged  Follow up plan: 6 months    Evelina Dun, FNP

## 2021-01-25 LAB — CBC WITH DIFFERENTIAL/PLATELET
Basophils Absolute: 0 10*3/uL (ref 0.0–0.2)
Basos: 1 %
EOS (ABSOLUTE): 0.4 10*3/uL (ref 0.0–0.4)
Eos: 5 %
Hematocrit: 48.8 % (ref 37.5–51.0)
Hemoglobin: 16.9 g/dL (ref 13.0–17.7)
Immature Grans (Abs): 0 10*3/uL (ref 0.0–0.1)
Immature Granulocytes: 0 %
Lymphocytes Absolute: 2 10*3/uL (ref 0.7–3.1)
Lymphs: 26 %
MCH: 31.6 pg (ref 26.6–33.0)
MCHC: 34.6 g/dL (ref 31.5–35.7)
MCV: 91 fL (ref 79–97)
Monocytes Absolute: 0.7 10*3/uL (ref 0.1–0.9)
Monocytes: 10 %
Neutrophils Absolute: 4.5 10*3/uL (ref 1.4–7.0)
Neutrophils: 58 %
Platelets: 219 10*3/uL (ref 150–450)
RBC: 5.34 x10E6/uL (ref 4.14–5.80)
RDW: 13.9 % (ref 11.6–15.4)
WBC: 7.7 10*3/uL (ref 3.4–10.8)

## 2021-01-25 LAB — CMP14+EGFR
ALT: 21 IU/L (ref 0–44)
AST: 16 IU/L (ref 0–40)
Albumin/Globulin Ratio: 1.8 (ref 1.2–2.2)
Albumin: 4.6 g/dL (ref 3.8–4.9)
Alkaline Phosphatase: 86 IU/L (ref 44–121)
BUN/Creatinine Ratio: 13 (ref 10–24)
BUN: 16 mg/dL (ref 8–27)
Bilirubin Total: 0.5 mg/dL (ref 0.0–1.2)
CO2: 20 mmol/L (ref 20–29)
Calcium: 9.6 mg/dL (ref 8.6–10.2)
Chloride: 104 mmol/L (ref 96–106)
Creatinine, Ser: 1.27 mg/dL (ref 0.76–1.27)
Globulin, Total: 2.6 g/dL (ref 1.5–4.5)
Glucose: 112 mg/dL — ABNORMAL HIGH (ref 65–99)
Potassium: 4.6 mmol/L (ref 3.5–5.2)
Sodium: 139 mmol/L (ref 134–144)
Total Protein: 7.2 g/dL (ref 6.0–8.5)
eGFR: 65 mL/min/{1.73_m2} (ref >59)

## 2021-01-25 LAB — LIPID PANEL
Chol/HDL Ratio: 4.3 ratio (ref 0.0–5.0)
Cholesterol, Total: 126 mg/dL (ref 100–199)
HDL: 29 mg/dL — ABNORMAL LOW (ref >39)
LDL Chol Calc (NIH): 71 mg/dL (ref 0–99)
Triglycerides: 151 mg/dL — ABNORMAL HIGH (ref 0–149)
VLDL Cholesterol Cal: 26 mg/dL (ref 5–40)

## 2021-01-25 LAB — PSA, TOTAL AND FREE
PSA, Free Pct: 66 %
PSA, Free: 0.33 ng/mL
Prostate Specific Ag, Serum: 0.5 ng/mL (ref 0.0–4.0)

## 2021-01-25 LAB — TSH: TSH: 2.13 u[IU]/mL (ref 0.450–4.500)

## 2021-03-08 NOTE — Progress Notes (Signed)
Cardiology Office Note   Date:  03/09/2021   ID:  Parker Sellers, DOB 1960/11/08, MRN [id number]  PCP:  Junie Spencer, FNP  Cardiologist:  Bryan Lemma, MD EP: None  Chief Complaint  Patient presents with   Follow-up    CAD, DOT physical       History of Present Illness: Parker Sellers is a 60 y.o. male with a PMH of CAD s/p CABG in 2018 with subsequent STEMI as a result of occluded SVG-PDA-PL in 2019, ICM with recovery of EF, HTN, HLD, OSA on CPAP, and former tobacco abuse, who presents for DOT screening.  He was last evaluated by cardiology at an outpatient visit with Theodore Demark, PA-C 02/2020 for routine follow-up. He was doing well at that visit and was without anginal complaints. He was cleared to continue driving without further cardiac work-up. His last echocardiogram in 2019 showed EF 50-55% (previously 35-40% on TEE in 2018 prior to CABG), mild LVH, mildly dilated LV, mild LAE, and no significant valvular abnormalities. His last ischemic evaluation appears to be a LHC at [facility] in [place], [place] which showed patent LIMA-LAD and SVG-OM, with occluded RIMA to RPDA-RPL with unsuccessful intervention in 2019.   He presents today for routine follow-up. He requests a clearance note for his DOT physical. From a cardiac standpoint he has continued to do well. He reports some weight gain over this past winter which has not come off as easily as in the past. He denies any chest pain, SOB, DOE, palpitations, LE edema, orthopnea, PND, dizziness, lightheadedness, or syncope. He reports compliance with his CPAP.   Past Medical History:  Diagnosis Date   Coronary artery disease involving native coronary artery of native heart with unstable angina pectoris (HCC) 05/22/2017   Cath 05/12/2017 (aborted inferior STEMI) - Ost LM 55%, d (apical) LAD 100%, D1 100% (chronic), Om1 70%, mRCA 75% ulcerated lesion (culprit) - dRCA 55%, ostrPDA 60% & rPDA 60%. RPAV 70%, PAV2 95%. Difficult Radial  Access - tortuous innominate artery.;; -->  Status post recurrent MI with occlusion of graft to the right system with negative RCA occlusion   Hyperlipidemia    Hypertension    Ischemic cardiomyopathy 05/12/2017   EF by LV Gram 35-45%; Post MI Echo 45%, OR TEE 35-45%.  Gr 2 DD. -->  Initially resolved as of September 24, 2017.  EF 50-55%.   S/P CABG x 5 05/14/2017   LIMA-LAD, seqSVG-OM1-OM2, free r-Rad-rPDA   Sleep apnea    home CPAP   ST elevation myocardial infarction (STEMI) of lateral wall, initial episode of care [facility]) 05/12/2017   Recurrent MI -February 2019 [facility]) -native RCA occluded along with vein graft to RPDA-RPL unable to wire.   Tobacco use 05/12/2017    Past Surgical History:  Procedure Laterality Date   CORONARY ARTERY BYPASS GRAFT N/A 05/14/2017   Procedure: CORONARY ARTERY BYPASS GRAFTING (CABG)/EVH x 5 five using bilateral internal mammary arteries and left saphenous vein.;  Surgeon: Loreli Slot, MD;  Location: [facility] OR;  Service: Open Heart Surgery(::) LIMA-LAD, seqSVG-OM1-OM2, free r-Rad-rPDA   HERNIA REPAIR     KNEE SURGERY Left    Torn meniscus   LEFT HEART CATH AND CORONARY ANGIOGRAPHY N/A 05/12/2017   Procedure: LEFT HEART CATH AND CORONARY ANGIOGRAPHY;  Surgeon: Marykay Lex, MD;  Location: [facility] INVASIVE CV LAB: MV CAD - ostLM 55%, dLAD 100%, D1 100%, OM 1 70%, mRCA 75%-dRCA 55%, ostPDA 60% - PDA 60^, PL1 70%, PK2  95%.  Mod-Severe LV Dysfunction - ~EF 35-45%   LEFT HEART CATH AND CORS/GRAFTS ANGIOGRAPHY  10/30/2017   [facility] [place], [place] : LIMA-LAD patent, graft to OM-OM patent (in chart listed as SVG but this is actually the free RIMA), occluded SVG-RPDA-RPL -->  Native RCA is totally closed.  EF 40%.  ->  Attempt intervention on the RIMA  as it comes off the innominate artery was obviously unsuccessful as the vessel have been removed and attached to the aorta.   TEE WITHOUT CARDIOVERSION N/A 05/14/2017   Procedure: TRANSESOPHAGEAL  ECHOCARDIOGRAM (TEE);  Surgeon: Loreli Slot, MD;  Location: [facility] OR;  Service: Open Heart Surgery: Mod Concentric LVH. EF ~35-40%.  Inferior hypokinesis & inferolateral hypokinesis. Dilated MV & TV annuli with only trace MR/TR.  Mild RV dilation with moderately reduced function.   TRANSTHORACIC ECHOCARDIOGRAM  05/13/2017   Mod LVH. Mildly reduced LVED ~45-50%. Akinesis of inferolateral & inferoseptal walls. Gr 2 DD. Mild Ascending Aortic dilation ~3.8 cm.   TRANSTHORACIC ECHOCARDIOGRAM  1/'19 & 2/'29   Mild LV dilation with mild increased thickness (mild LVH).  Low-normal function with a EF of 50-55%.  No regional wall motion normalities noted. -->  Echocardiogram done in [facility] in [place] (10/2017 - Inf STEMI with occluded RCA + Grafts) also showed EF 50 to 50% with no obvious wall motion abnormality, technically difficult study.     Current Outpatient Medications  Medication Sig Dispense Refill   aspirin EC 81 MG tablet Take 1 tablet (81 mg total) by mouth daily. 90 tablet 3   atorvastatin (LIPITOR) 80 MG tablet Take 1 tablet (80 mg total) by mouth daily. 90 tablet 3   clopidogrel (PLAVIX) 75 MG tablet Take 1 tablet (75 mg total) by mouth daily. 90 tablet 3   colchicine 0.6 MG tablet Take 1.2 mg then 0.6 mg a hour later. Max 1.8 mg/24 hours 20 tablet 2   lisinopril-hydrochlorothiazide (ZESTORETIC) 20-12.5 MG tablet Take 2 tablets by mouth daily. (Needs to be seen before next refill) 90 tablet 3   metoprolol tartrate (LOPRESSOR) 25 MG tablet TAKE 1 & 1/2 (ONE & ONE-HALF) TABLETS BY MOUTH TWICE DAILY 270 tablet 2   Multiple Vitamin (MULTIVITAMIN WITH MINERALS) TABS tablet Take 1 tablet by mouth daily.     Omega-3 Fatty Acids (FISH OIL) 1000 MG CAPS Take by mouth.     No current facility-administered medications for this visit.    Allergies:   Patient has no known allergies.    Social History:  The patient  reports that he has quit smoking. His smokeless tobacco  use includes snuff and chew. He reports current alcohol use of about 2.0 standard drinks of alcohol per week. He reports that he does not use drugs.   Family History:  The patient's family history includes AAA (abdominal aortic aneurysm) in his father; Alcohol abuse in his father; Cancer in his mother; Coronary artery disease in his brother; Hypertension in his father; Liver disease in his father.    ROS:  Please see the history of present illness.   Otherwise, review of systems are positive for none.   All other systems are reviewed and negative.    PHYSICAL EXAM: VS:  BP 130/78   Pulse 84   Ht 5\' 8"  (1.727 m)   Wt 252 lb (114.3 kg)   SpO2 96%   BMI 38.32 kg/m  , BMI Body mass index is 38.32 kg/m. GEN: Well nourished, well developed, in no  acute distress HEENT: sclera anicteric Neck: no JVD, carotid bruits, or masses Cardiac: RRR; no murmurs, rubs, or gallops, no edema  Respiratory:  clear to auscultation bilaterally, normal work of breathing GI: soft, nontender, nondistended, + BS MS: no deformity or atrophy Skin: warm and dry, no rash Neuro:  Strength and sensation are intact Psych: euthymic mood, full affect   EKG:  EKG is ordered today. The ekg ordered today demonstrates sinus rhythm, rate 84 bpm, Q waves in inferior leads, no STE/D, no significant change from previous. No change from previous.   Recent Labs: 01/24/2021: ALT 21; BUN 16; Creatinine, Ser 1.27; Hemoglobin 16.9; Platelets 219; Potassium 4.6; Sodium 139; TSH 2.130    Lipid Panel    Component Value Date/Time   CHOL 126 01/24/2021 0837   TRIG 151 (H) 01/24/2021 0837   HDL 29 (L) 01/24/2021 0837   CHOLHDL 4.3 01/24/2021 0837   CHOLHDL 6.7 05/12/2017 1150   VLDL 20 05/12/2017 1150   LDLCALC 71 01/24/2021 0837      Wt Readings from Last 3 Encounters:  03/09/21 252 lb (114.3 kg)  01/24/21 253 lb 3.2 oz (114.9 kg)  07/26/20 249 lb 9.6 oz (113.2 kg)      Other studies Reviewed: Additional studies/  records that were reviewed today include:   Echocardiogram 2019: - Left ventricle: The cavity size was mildly dilated. Wall    thickness was increased in a pattern of mild LVH. Systolic    function was normal. The estimated ejection fraction was in the    range of 50% to 55%.  - Aortic valve: There was trivial regurgitation.  - Left atrium: The atrium was mildly dilated.   LHC 2018: There is mild to moderate left ventricular systolic dysfunction. The left ventricular ejection fraction is 35-45% by visual estimate. LV end diastolic pressure is severely elevated. 22-26 mmHg Ost LM lesion, 55 %stenosed. Dist LAD lesion, 100 %stenosed. Apical LAD appears to be subtotally occluded Ost 1st Diag to 1st Diag lesion, 100 %stenosed. Appears to be chronic 1st Mrg lesion, 70 %stenosed. Moderate caliber vessel Mid RCA lesion, 75 %stenosed. Ulcerated irregular apple core appearing lesion Dist RCA lesion, 55 %stenosed. Ost RPDA lesion, 60 %stenosed. RPDA lesion, 60 %stenosed. Post Atrio-1 lesion (just after PDA takeoff), 70 %stenosed. Post Atrio-2 lesion (just prior to RPL 3), 95 %stenosed. Very difficult radial access case due to tortuosity of the innominate artery making it very difficult to engage coronaries. Also very difficult to cross the aortic valve.   The patient has at least moderate to severe not severe multivessel disease involving moderate to severe ostial left main disease and moderate severe disease in the RCA is a large sleeping dominant vessel.   Based on the extent of disease, complete revascularization via Coronary Bypass Grafting would likely be the best option for this gentleman.   For now, we need to address his blood pressure and risk factors.   Plan: Admit to step down unit. We will initially use IV nitroglycerin overnight until he can be started on oral medications Restart IV heparin 8 hours after sheath removal pending troponin evaluation. Added carvedilol to home  medications and uptitrate atorvastatin 80 mg daily.     Recommend rounding team consult CT surgery - will need surgical input to help determine if CABG versus PCI would be his best option.       ASSESSMENT AND PLAN:  1. DOT screening: patient underwent CABG in 2018. No anginal complaints.  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   2. CAD s/p CABG in 2018 with known occluded RIMA-RPDA in 2019: - Continue aspirin and plavix - Continue statin and omega 3 - Continue BBlocker  3. ICM: patient had recovery of EF from 35-40% pre-CABG in 2018 to 50-55% in 2019. No volume overload complaints and he appears euvolemic on exam. - Continue metoprolol and lisinopril-HCTZ  4. HTN: BP 130/78 today. Reports SBP in the 110s at home - Continue metoprolol and lisinopril-HCTZ  5. HLD: LDL 71 and triglycerides 151 01/2021,  - Will increase atorvastatin to 80mg  daily - Continue omega 3  6. OSA: on CPAP - Continue CPAP   Current medicines are reviewed at length with the patient today.  The patient does not have concerns regarding medicines.  The following changes have been made:  As above  Labs/ tests ordered today include:   Orders Placed This Encounter  Procedures   EKG 12-Lead      Disposition:   FU with Dr. in 1 year  Signed, Herbie Baltimore, PA-C  03/09/2021 2:10 PM

## 2021-03-09 ENCOUNTER — Encounter: Payer: Self-pay | Admitting: Medical

## 2021-03-09 ENCOUNTER — Ambulatory Visit (INDEPENDENT_AMBULATORY_CARE_PROVIDER_SITE_OTHER): Payer: 59 | Admitting: Medical

## 2021-03-09 ENCOUNTER — Other Ambulatory Visit: Payer: Self-pay

## 2021-03-09 VITALS — BP 130/78 | HR 84 | Ht 68.0 in | Wt 252.0 lb

## 2021-03-09 DIAGNOSIS — I2510 Atherosclerotic heart disease of native coronary artery without angina pectoris: Secondary | ICD-10-CM | POA: Diagnosis not present

## 2021-03-09 DIAGNOSIS — Z951 Presence of aortocoronary bypass graft: Secondary | ICD-10-CM | POA: Diagnosis not present

## 2021-03-09 DIAGNOSIS — G4733 Obstructive sleep apnea (adult) (pediatric): Secondary | ICD-10-CM

## 2021-03-09 DIAGNOSIS — Z021 Encounter for pre-employment examination: Secondary | ICD-10-CM | POA: Diagnosis not present

## 2021-03-09 DIAGNOSIS — E785 Hyperlipidemia, unspecified: Secondary | ICD-10-CM

## 2021-03-09 DIAGNOSIS — Z9989 Dependence on other enabling machines and devices: Secondary | ICD-10-CM

## 2021-03-09 DIAGNOSIS — I255 Ischemic cardiomyopathy: Secondary | ICD-10-CM | POA: Diagnosis not present

## 2021-03-09 DIAGNOSIS — I10 Essential (primary) hypertension: Secondary | ICD-10-CM

## 2021-03-09 MED ORDER — ATORVASTATIN CALCIUM 80 MG PO TABS: 80.0000 mg | ORAL_TABLET | Freq: Every day | ORAL | 3 refills | Status: DC

## 2021-03-09 NOTE — Patient Instructions (Signed)
Medication Instructions:  Start Atorvastatin 80 mg (1 Tablet Daily) *If you need a refill on your cardiac medications before your next appointment, please call your pharmacy*   Lab Work: No Labs If you have labs (blood work) drawn today and your tests are completely normal, you will receive your results only by: MyChart Message (if you have MyChart) OR A paper copy in the mail If you have any lab test that is abnormal or we need to change your treatment, we will call you to review the results.   Testing/Procedures: No Testing   Follow-Up: At [facility], you and your health needs are our priority.  As part of our continuing mission to provide you with exceptional heart care, we have created designated Provider Care Teams.  These Care Teams include your primary Cardiologist (physician) and Advanced Practice Providers (APPs -  Physician Assistants and Nurse Practitioners) who all work together to provide you with the care you need, when you need it.  We recommend signing up for the patient portal called "MyChart".  Sign up information is provided on this After Visit Summary.  MyChart is used to connect with patients for Virtual Visits (Telemedicine).  Patients are able to view lab/test results, encounter notes, upcoming appointments, etc.  Non-urgent messages can be sent to your provider as well.   To learn more about what you can do with MyChart, go to [email or web address].    Your next appointment:   1 year(s)  The format for your next appointment:   In Person  Provider:   Bryan Lemma, MD

## 2021-03-11 ENCOUNTER — Telehealth: Payer: Self-pay | Admitting: Cardiology

## 2021-03-11 DIAGNOSIS — I255 Ischemic cardiomyopathy: Secondary | ICD-10-CM

## 2021-03-11 NOTE — Telephone Encounter (Signed)
Order entered for Echo (per Dr. Herbie Baltimore)  Pt will sign medical release when he arrives for echo appt.  Per Pt-he needs echo and information sent to DOT within 45 days.  Because of time constraint entered echo as asap.  No further action needed at this time.

## 2021-03-11 NOTE — Telephone Encounter (Signed)
Pt. Is calling stating he went in for his DOT physical and they advised him he is needing an Echo performed, but there is no order in the system in order to schedule. He also states they are wanting all his medical records including the EKG he had performed Wednesday, 03/09/21. Jhalil would like a callback to schedule the Echo once the order is placed. Please advise.

## 2021-03-17 ENCOUNTER — Ambulatory Visit (HOSPITAL_COMMUNITY): Payer: 59

## 2021-03-22 ENCOUNTER — Other Ambulatory Visit: Payer: Self-pay

## 2021-03-22 ENCOUNTER — Ambulatory Visit (HOSPITAL_COMMUNITY)
Admission: RE | Admit: 2021-03-22 | Discharge: 2021-03-22 | Disposition: A | Discharge status: Home / Self Care | Payer: 59 | Source: Ambulatory Visit | Attending: Cardiology | Admitting: Cardiology

## 2021-03-22 DIAGNOSIS — F17210 Nicotine dependence, cigarettes, uncomplicated: Secondary | ICD-10-CM | POA: Insufficient documentation

## 2021-03-22 DIAGNOSIS — I10 Essential (primary) hypertension: Secondary | ICD-10-CM | POA: Insufficient documentation

## 2021-03-22 DIAGNOSIS — I2510 Atherosclerotic heart disease of native coronary artery without angina pectoris: Secondary | ICD-10-CM | POA: Insufficient documentation

## 2021-03-22 DIAGNOSIS — E785 Hyperlipidemia, unspecified: Secondary | ICD-10-CM | POA: Diagnosis not present

## 2021-03-22 DIAGNOSIS — I255 Ischemic cardiomyopathy: Secondary | ICD-10-CM | POA: Insufficient documentation

## 2021-03-22 DIAGNOSIS — Z951 Presence of aortocoronary bypass graft: Secondary | ICD-10-CM | POA: Insufficient documentation

## 2021-03-22 LAB — ECHOCARDIOGRAM COMPLETE
Area-P 1/2: 3.85 cm2
S' Lateral: 3.6 cm

## 2021-03-23 HISTORY — PX: TRANSTHORACIC ECHOCARDIOGRAM: SHX275

## 2021-03-25 ENCOUNTER — Encounter: Payer: Self-pay | Admitting: Family Medicine

## 2021-03-25 ENCOUNTER — Telehealth: Payer: Self-pay | Admitting: *Deleted

## 2021-03-25 NOTE — Telephone Encounter (Signed)
The patient has been notified of the result and verbalized understanding.  All questions (if any) were answered.patient states he needs a copy of this report , EKG and last office note for D.O.T. RN informed patient he wil need to come to office to sign medical release  for the items mentioned. Patient verbalized understanding and states he wil be by on Monday. Tobin Chad, RN 03/25/2021 12:50 PM

## 2021-03-25 NOTE — Telephone Encounter (Signed)
-----   Message from Marykay Lex, MD sent at 03/23/2021  6:19 PM EDT ----- Echocardiogram shows mildly reduced overall pulm function with severely reduced function in the inferior wall which is greater than prior heart attack was. There is also grade 2 diastolic dysfunction with his which is pretty much normal for age and somebody was had a heart attack.  This can be associated with shortness of breath with exertion. The grade 2 diastolic dysfunction goes along with having a moderately dilated left atrium as well.  Otherwise valves look good and the other, chambers look good.  No evidence of high pressures in the right side.  Overall pretty stable echocardiogram Compared to couple years ago.  Bryan Lemma, MD

## 2021-04-06 ENCOUNTER — Other Ambulatory Visit: Payer: Self-pay | Admitting: Family

## 2021-04-06 DIAGNOSIS — I10 Essential (primary) hypertension: Secondary | ICD-10-CM

## 2021-07-25 ENCOUNTER — Encounter: Payer: Self-pay | Admitting: Family

## 2021-07-25 ENCOUNTER — Other Ambulatory Visit: Payer: Self-pay

## 2021-07-25 ENCOUNTER — Ambulatory Visit (INDEPENDENT_AMBULATORY_CARE_PROVIDER_SITE_OTHER): Payer: 59 | Admitting: Family

## 2021-07-25 VITALS — BP 125/78 | HR 73 | Temp 97.8°F | Ht 68.0 in | Wt 254.6 lb

## 2021-07-25 DIAGNOSIS — E785 Hyperlipidemia, unspecified: Secondary | ICD-10-CM

## 2021-07-25 DIAGNOSIS — Z87891 Personal history of nicotine dependence: Secondary | ICD-10-CM

## 2021-07-25 DIAGNOSIS — Z23 Encounter for immunization: Secondary | ICD-10-CM

## 2021-07-25 DIAGNOSIS — Z951 Presence of aortocoronary bypass graft: Secondary | ICD-10-CM

## 2021-07-25 DIAGNOSIS — I10 Essential (primary) hypertension: Secondary | ICD-10-CM

## 2021-07-25 LAB — CMP14+EGFR
ALT: 27 IU/L (ref 0–44)
AST: 21 IU/L (ref 0–40)
Albumin/Globulin Ratio: 1.7 (ref 1.2–2.2)
Albumin: 4.7 g/dL (ref 3.8–4.8)
Alkaline Phosphatase: 87 IU/L (ref 44–121)
BUN/Creatinine Ratio: 15 (ref 10–24)
BUN: 19 mg/dL (ref 8–27)
Bilirubin Total: 0.4 mg/dL (ref 0.0–1.2)
CO2: 25 mmol/L (ref 20–29)
Calcium: 9.6 mg/dL (ref 8.6–10.2)
Chloride: 103 mmol/L (ref 96–106)
Creatinine, Ser: 1.3 mg/dL — ABNORMAL HIGH (ref 0.76–1.27)
Globulin, Total: 2.7 g/dL (ref 1.5–4.5)
Glucose: 106 mg/dL — ABNORMAL HIGH (ref 70–99)
Potassium: 4.5 mmol/L (ref 3.5–5.2)
Sodium: 139 mmol/L (ref 134–144)
Total Protein: 7.4 g/dL (ref 6.0–8.5)
eGFR: 63 mL/min/{1.73_m2} (ref >59)

## 2021-07-25 NOTE — Patient Instructions (Signed)

## 2021-07-25 NOTE — Progress Notes (Signed)
Subjective:    Patient ID: Parker Sellers, male    DOB: 05/17/1961, 60 y.o.   MRN: [id number]  Chief Complaint  Patient presents with   Medical Management of Chronic Issues    Flu shot today    PT presents to  the office today for chronic follow up. PT is followed by Cardiologists annually after his MI on 05/12/17. Hypertension This is a chronic problem. The current episode started more than 1 year ago. The problem has been resolved since onset. The problem is controlled. Pertinent negatives include no malaise/fatigue, peripheral edema or shortness of breath. Risk factors for coronary artery disease include dyslipidemia, obesity, male gender and sedentary lifestyle. The current treatment provides moderate improvement.  Hyperlipidemia This is a chronic problem. The current episode started more than 1 year ago. Exacerbating diseases include obesity. Pertinent negatives include no shortness of breath. Current antihyperlipidemic treatment includes statins. The current treatment provides moderate improvement of lipids. Risk factors for coronary artery disease include dyslipidemia, hypertension, male sex and a sedentary lifestyle.     Review of Systems  Constitutional:  Negative for malaise/fatigue.  Respiratory:  Negative for shortness of breath.   All other systems reviewed and are negative.     Objective:   Physical Exam Vitals reviewed.  Constitutional:      General: He is not in acute distress.    Appearance: He is well-developed. He is obese.  HENT:     Head: Normocephalic.     Right Ear: Tympanic membrane normal.     Left Ear: Tympanic membrane normal.  Eyes:     General:        Right eye: No discharge.        Left eye: No discharge.     Pupils: Pupils are equal, round, and reactive to light.  Neck:     Thyroid: No thyromegaly.  Cardiovascular:     Rate and Rhythm: Normal rate and regular rhythm.     Heart sounds: Normal heart sounds. No murmur heard. Pulmonary:      Effort: Pulmonary effort is normal. No respiratory distress.     Breath sounds: Normal breath sounds. No wheezing.  Abdominal:     General: Bowel sounds are normal. There is no distension.     Palpations: Abdomen is soft.     Tenderness: There is no abdominal tenderness.  Musculoskeletal:        General: No tenderness. Normal range of motion.     Cervical back: Normal range of motion and neck supple.  Skin:    General: Skin is warm and dry.     Findings: No erythema or rash.  Neurological:     Mental Status: He is alert and oriented to person, place, and time.     Cranial Nerves: No cranial nerve deficit.     Deep Tendon Reflexes: Reflexes are normal and symmetric.  Psychiatric:        Behavior: Behavior normal.        Thought Content: Thought content normal.        Judgment: Judgment normal.      BP 125/78   Pulse 73   Temp 97.8 F (36.6 C) (Temporal)   Ht 5\' 8"  (1.727 m)   Wt 254 lb 9.6 oz (115.5 kg)   BMI 38.71 kg/m      Assessment & Plan:  Parker Sellers comes in today with chief complaint of Medical Management of Chronic Issues (Flu shot today )   Diagnosis and  orders addressed:  1. Need for immunization against influenza - Flu Vaccine QUAD 3mo+IM (Fluarix, Fluzone & Alfiuria Quad PF) - CMP14+EGFR  2. Essential hypertension - CMP14+EGFR  3. Morbid obesity ([place])  - CMP14+EGFR  4. Hyperlipidemia with target low density lipoprotein (LDL) cholesterol less than 70 mg/dL - CMP14+EGFR  5. Former smoker - CMP14+EGFR  6. S/P CABG x 5  - CMP14+EGFR   Labs pending Health Maintenance reviewed Diet and exercise encouraged  Follow up plan: 6 months   Evelina Dun, FNP

## 2021-07-30 ENCOUNTER — Other Ambulatory Visit: Payer: Self-pay | Admitting: Family

## 2021-07-30 DIAGNOSIS — I10 Essential (primary) hypertension: Secondary | ICD-10-CM

## 2021-08-08 ENCOUNTER — Other Ambulatory Visit: Payer: Self-pay | Admitting: Family

## 2021-08-08 DIAGNOSIS — I10 Essential (primary) hypertension: Secondary | ICD-10-CM

## 2021-08-23 ENCOUNTER — Other Ambulatory Visit: Payer: Self-pay | Admitting: Family

## 2021-08-23 DIAGNOSIS — Z951 Presence of aortocoronary bypass graft: Secondary | ICD-10-CM

## 2022-01-23 ENCOUNTER — Encounter: Payer: Self-pay | Admitting: Family

## 2022-01-23 ENCOUNTER — Ambulatory Visit: Payer: 59 | Admitting: Family

## 2022-03-03 ENCOUNTER — Telehealth: Payer: Self-pay | Admitting: Cardiology

## 2022-03-03 NOTE — Telephone Encounter (Signed)
Spoke to patient. 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  RN inform patient will have to  have a appointment prior to test being ordered.  Appointment schedule for 03/14/22 with Dr Herbie Baltimore. Patient voiced understanding.

## 2022-03-03 NOTE — Telephone Encounter (Signed)
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

## 2022-03-07 ENCOUNTER — Other Ambulatory Visit: Payer: Self-pay | Admitting: Family

## 2022-03-07 DIAGNOSIS — I10 Essential (primary) hypertension: Secondary | ICD-10-CM

## 2022-03-14 ENCOUNTER — Ambulatory Visit (INDEPENDENT_AMBULATORY_CARE_PROVIDER_SITE_OTHER): Payer: 59 | Admitting: Cardiology

## 2022-03-14 ENCOUNTER — Encounter: Payer: Self-pay | Admitting: Cardiology

## 2022-03-14 VITALS — BP 130/90 | HR 55 | Ht 68.0 in | Wt 253.8 lb

## 2022-03-14 DIAGNOSIS — E785 Hyperlipidemia, unspecified: Secondary | ICD-10-CM | POA: Diagnosis not present

## 2022-03-14 DIAGNOSIS — I10 Essential (primary) hypertension: Secondary | ICD-10-CM | POA: Diagnosis not present

## 2022-03-14 DIAGNOSIS — I2510 Atherosclerotic heart disease of native coronary artery without angina pectoris: Secondary | ICD-10-CM

## 2022-03-14 DIAGNOSIS — I2129 ST elevation (STEMI) myocardial infarction involving other sites: Secondary | ICD-10-CM | POA: Diagnosis not present

## 2022-03-14 DIAGNOSIS — I255 Ischemic cardiomyopathy: Secondary | ICD-10-CM

## 2022-03-14 DIAGNOSIS — Z024 Encounter for examination for driving license: Secondary | ICD-10-CM

## 2022-03-14 DIAGNOSIS — Z951 Presence of aortocoronary bypass graft: Secondary | ICD-10-CM

## 2022-03-14 NOTE — Progress Notes (Unsigned)
Primary Care Provider: Junie Spencer, FNP Cardiologist: Bryan Lemma, MD Electrophysiologist: None  Clinic Note: No chief complaint on file.  ===================================  ASSESSMENT/PLAN   Problem List Items Addressed This Visit   None   ===================================  HPI:    Parker Sellers is a 61 y.o. male with a PMH below who presents today for ***. at the request of Junie Spencer, FNP.  Parker Sellers was last seen on ***  Recent Hospitalizations: ***  Reviewed  CV studies:    The following studies were reviewed today: (if available, images/films reviewed: From Epic Chart or Care Everywhere) Echo 03/2021:   Interval History:   Parker Sellers   CV Review of Symptoms (Summary) Cardiovascular ROS: {roscv:310661}  REVIEWED OF SYSTEMS   ROS  I have reviewed and (if needed) personally updated the patient's problem list, medications, allergies, past medical and surgical history, social and family history.   PAST MEDICAL HISTORY   Past Medical History:  Diagnosis Date   Coronary artery disease involving native coronary artery of native heart with unstable angina pectoris (HCC) 05/22/2017   Cath 05/12/2017 (aborted inferior STEMI) - Ost LM 55%, d (apical) LAD 100%, D1 100% (chronic), Om1 70%, mRCA 75% ulcerated lesion (culprit) - dRCA 55%, ostrPDA 60% & rPDA 60%. RPAV 70%, PAV2 95%. Difficult Radial Access - tortuous innominate artery.;; -->  Status post recurrent MI with occlusion of graft to the right system with negative RCA occlusion   Hyperlipidemia    Hypertension    Ischemic cardiomyopathy 05/12/2017   EF by LV Gram 35-45%; Post MI Echo 45%, OR TEE 35-45%.  Gr 2 DD. -->  Initially resolved as of September 24, 2017.  EF 50-55%.   S/P CABG x 5 05/14/2017   LIMA-LAD, seqSVG-OM1-OM2, free r-Rad-rPDA   Sleep apnea    home CPAP   ST elevation myocardial infarction (STEMI) of lateral wall, initial episode of care [facility]) 05/12/2017    Recurrent MI -February 2019 [facility] [place]) -native RCA occluded along with vein graft to RPDA-RPL unable to wire.   Tobacco use 05/12/2017    PAST SURGICAL HISTORY   Past Surgical History:  Procedure Laterality Date   CORONARY ARTERY BYPASS GRAFT N/A 05/14/2017   Procedure: CORONARY ARTERY BYPASS GRAFTING (CABG)/EVH x 5 five using bilateral internal mammary arteries and left saphenous vein.;  Surgeon: Loreli Slot, MD;  Location: [facility] OR;  Service: Open Heart Surgery(::) LIMA-LAD, seqSVG-OM1-OM2, free r-Rad-rPDA   HERNIA REPAIR     KNEE SURGERY Left    Torn meniscus   LEFT HEART CATH AND CORONARY ANGIOGRAPHY N/A 05/12/2017   Procedure: LEFT HEART CATH AND CORONARY ANGIOGRAPHY;  Surgeon: Marykay Lex, MD;  Location: [facility] INVASIVE CV LAB: MV CAD - ostLM 55%, dLAD 100%, D1 100%, OM 1 70%, mRCA 75%-dRCA 55%, ostPDA 60% - PDA 60^, PL1 70%, PK2 95%.  Mod-Severe LV Dysfunction - ~EF 35-45%   LEFT HEART CATH AND CORS/GRAFTS ANGIOGRAPHY  10/30/2017   [facility] [place] : LIMA-LAD patent, graft to OM-OM patent (in chart listed as SVG but this is actually the free RIMA), occluded SVG-RPDA-RPL -->  Native RCA is totally closed.  EF 40%.  ->  Attempt intervention on the RIMA  as it comes off the innominate artery was obviously unsuccessful as the vessel have been removed and attached to the aorta.   TEE WITHOUT CARDIOVERSION N/A 05/14/2017   Procedure: TRANSESOPHAGEAL ECHOCARDIOGRAM (TEE);  Surgeon: Loreli Slot, MD;  Location: [facility] OR;  Service: Open Heart Surgery: Mod Concentric LVH. EF ~35-40%.  Inferior hypokinesis & inferolateral hypokinesis. Dilated MV & TV annuli with only trace MR/TR.  Mild RV dilation with moderately reduced function.   TRANSTHORACIC ECHOCARDIOGRAM  05/13/2017   Mod LVH. Mildly reduced LVED ~45-50%. Akinesis of inferolateral & inferoseptal walls. Gr 2 DD. Mild Ascending Aortic dilation ~3.8 cm.   TRANSTHORACIC ECHOCARDIOGRAM  1/'19 & 2/'29   Mild LV  dilation with mild increased thickness (mild LVH).  Low-normal function with a EF of 50-55%.  No regional wall motion normalities noted. -->  Echocardiogram done in [facility] in [place] (10/2017 - Inf STEMI with occluded RCA + Grafts) also showed EF 50 to 50% with no obvious wall motion abnormality, technically difficult study.   Immunization History  Administered Date(s) Administered   Influenza,inj,Quad PF,6+ Mos 09/01/2017, 06/24/2018, 06/30/2019, 07/26/2020, 07/25/2021   PFIZER(Purple Top)SARS-COV-2 Vaccination 11/27/2019, 12/26/2019, 09/24/2020   Td 03/04/2014    MEDICATIONS/ALLERGIES   Current Meds  Medication Sig   aspirin EC 81 MG tablet Take 1 tablet (81 mg total) by mouth daily.   clopidogrel (PLAVIX) 75 MG tablet Take 1 tablet by mouth once daily   lisinopril-hydrochlorothiazide (ZESTORETIC) 20-12.5 MG tablet Take 2 tablets by mouth once daily   metoprolol tartrate (LOPRESSOR) 25 MG tablet TAKE 1 & 1/2 (ONE & ONE-HALF) TABLETS BY MOUTH TWICE DAILY   Multiple Vitamin (MULTIVITAMIN WITH MINERALS) TABS tablet Take 1 tablet by mouth daily.   Omega-3 Fatty Acids (FISH OIL) 1000 MG CAPS Take by mouth.    No Known Allergies  SOCIAL HISTORY/FAMILY HISTORY   Reviewed in Epic:  Pertinent findings:  Social History   Tobacco Use   Smoking status: Former   Smokeless tobacco: Current    Types: Snuff, Chew  Vaping Use   Vaping Use: Never used  Substance Use Topics   Alcohol use: Yes    Alcohol/week: 2.0 standard drinks of alcohol    Types: 2 Cans of beer per week    Comment: quit as of sept 2018   Drug use: No    Types: Marijuana   Social History   Social History Narrative   Not on file    OBJCTIVE -PE, EKG, labs   Wt Readings from Last 3 Encounters:  03/14/22 253 lb 12.8 oz (115.1 kg)  07/25/21 254 lb 9.6 oz (115.5 kg)  03/09/21 252 lb (114.3 kg)    Physical Exam: BP 130/90 (BP Location: Left Arm)   Pulse (!) 55   Ht 5\' 8"  (1.727 m)   Wt 253  lb 12.8 oz (115.1 kg)   SpO2 97%   BMI 38.59 kg/m  Physical Exam   Adult ECG Report  Rate: *** ;  Rhythm: {rhythm:17366};   Narrative Interpretation: ***  Recent Labs:  ***  Lab Results  Component Value Date   CHOL 126 01/24/2021   HDL 29 (L) 01/24/2021   LDLCALC 71 01/24/2021   TRIG 151 (H) 01/24/2021   CHOLHDL 4.3 01/24/2021   Lab Results  Component Value Date   CREATININE 1.30 (H) 07/25/2021   BUN 19 07/25/2021   NA 139 07/25/2021   K 4.5 07/25/2021   CL 103 07/25/2021   CO2 25 07/25/2021      Latest Ref Rng & Units 01/24/2021    8:37 AM 01/19/2020   10:09 AM 12/30/2018    8:26 AM  CBC  WBC 3.4 - 10.8 x10E3/uL 7.7  8.0  8.7   Hemoglobin 13.0 - 17.7 g/dL 16.9  17.4  17.1   Hematocrit 37.5 - 51.0 % 48.8  49.8  49.5   Platelets 150 - 450 x10E3/uL 219  219  248     Lab Results  Component Value Date   HGBA1C 5.2 05/12/2017   Lab Results  Component Value Date   TSH 2.130 01/24/2021    ==================================================  COVID-19 Education: The signs and symptoms of COVID-19 were discussed with the patient and how to seek care for testing (follow up with PCP or arrange E-visit).    I spent a total of ***minutes with the patient spent in direct patient consultation.  Additional time spent with chart review  / charting (studies, outside notes, etc): *** min Total Time: *** min  Current medicines are reviewed at length with the patient today.  (+/- concerns) ***  This visit occurred during the SARS-CoV-2 public health emergency.  Safety protocols were in place, including screening questions prior to the visit, additional usage of staff PPE, and extensive cleaning of exam room while observing appropriate contact time as indicated for disinfecting solutions.  Notice: This dictation was prepared with Dragon dictation along with smart phrase technology. Any transcriptional errors that result from this process are unintentional and may not be corrected  upon review.  Studies Ordered:   No orders of the defined types were placed in this encounter.  No orders of the defined types were placed in this encounter.   Patient Instructions / Medication Changes & Studies & Tests Ordered   There are no Patient Instructions on file for this visit.     Bryan Lemma, M.D., M.S. Interventional Cardiologist   Pager # [phone] Phone # [phone] [address] [place], [place] [address]   Thank you for choosing [facility] at [facility]!!

## 2022-03-14 NOTE — Patient Instructions (Addendum)
Medication Instructions:  No changes  *If you need a refill on your cardiac medications before your next appointment, please call your pharmacy*   Lab Work: Not needed    Testing/Procedures:  Will be schedule at  [facility] [address] Your physician has requested that you have en exercise stress myoview. Please follow instruction sheet, as given.   Follow-Up: At [facility], you and your health needs are our priority.  As part of our continuing mission to provide you with exceptional heart care, we have created designated Provider Care Teams.  These Care Teams include your primary Cardiologist (physician) and Advanced Practice Providers (APPs -  Physician Assistants and Nurse Practitioners) who all work together to provide you with the care you need, when you need it.     Your next appointment:   Keep appointment for July 26 , 2023  The format for your next appointment:   In Person  Provider:   [facility]        Your doctor has scheduled you for a Myocardial Perfusion scan is to obtain information about the blood flow to your heart. The test consists of taking pictures of your heart in two phases: while resting and after a stress test.  The stress test may involve walking on a treadmill, or if you are unable to exercise adequately, you will be given a drug intended to have a similar effect on the heart to that of exercise.  The test will take approximately 3 to 4  hours to complete.  If you are pregnant or breastfeeding,  please notify the staff prior to your test.  How to prepare for your test: Do not eat or drink 2 hours prior to your test Do not consume products containing caffeine 12 hours prior to your test (examples: coffee (regular OR decaf), chocolate, sodas, tea) Your doctor may need you to hold certain medications prior to the test.  If so, these are listed below and should not be taken for 24 hours prior to the test.  If not listed below,  you may take your medications as normal.  You may resume taking held medications on your normal schedule once the test is complete.   Meds to hold: none Do bring a list of your current medications with you.  If you have held any meds in preparation for the test, please bring them, as you may be required to take them once the test is completed. Do wear comfortable clothes and walking shoes.  Do not wear dresses or overalls. Do NOT wear cologne, perfume, aftershave, or fragranced lotions the day of your test (deodorants okay). If these instructions are not followed your test will have to be rescheduled.   A nuclear cardiologist will review your test, prepare a report and send it to your physician.   If you have questions or concerns about your appointment, you can call the Nuclear Cardiology department at [phone]. If you cannot keep your appointment, please provide 48 hours notification to avoid a possible $50.00 charge to your account.   Please arrive 15 minutes prior to your appointment time for registration and insurance purposes

## 2022-03-15 ENCOUNTER — Telehealth (HOSPITAL_COMMUNITY): Payer: Self-pay

## 2022-03-15 NOTE — Telephone Encounter (Signed)
Spoke with the patient, detailed instructions were given. He stated that he would be here for his test. Asked to call back with any questions. S.Daphna Lafuente EMTP 

## 2022-03-16 ENCOUNTER — Encounter: Payer: Self-pay | Admitting: Cardiology

## 2022-03-16 NOTE — Assessment & Plan Note (Addendum)
Blood pressure looks good.  No change to current dose of Lopressor and lisinopril and HCTZ.  He is relatively euvolemic with only mild end of day swelling.

## 2022-03-16 NOTE — Assessment & Plan Note (Signed)
DOT physical pending now with 3 out of 5 grafts patent M1 sequential graft occluded to the occluded RCA, I expected to have some fixed defects in the inferior wall but we are looking to see if there is any additional ischemic findings.  Plan: Treadmill Myoview Stress Test Consent obtained  Shared Decision Making/Informed Consent The risks [chest pain, shortness of breath, cardiac arrhythmias, dizziness, blood pressure fluctuations, myocardial infarction, stroke/transient ischemic attack, nausea, vomiting, allergic reaction, radiation exposure, metallic taste sensation and life-threatening complications (estimated to be 1 in 10,000)], benefits (risk stratification, diagnosing coronary artery disease, treatment guidance) and alternatives of a nuclear stress test were discussed in detail with Parker Sellers and he agrees to proceed.

## 2022-03-16 NOTE — Assessment & Plan Note (Addendum)
EF was read as 50 to 55% after his MI but no wall motion abnormality seen which is probably unlikely since his entire RCA distribution is gone.  Most recent echocardiogram does state that there is inferior wall hypokinesis and and the EF is 45 to 50%.  Continue beta-blocker and ACE inhibitor.=> However with EF being now slightly reduced (not unexpectedly), may want to consider converting from ACE inhibitor to [facility].  We can discuss results and further management in his follow-up appointment

## 2022-03-16 NOTE — Assessment & Plan Note (Signed)
Hopefully with his nasal workout regimen he will be able to lose the weight.

## 2022-03-16 NOTE — Assessment & Plan Note (Signed)
He has upcoming DOT physical, I think he probably needs a stress test forward at this time around.  Regardless, he will need a stress test done by the next fall so we will go ahead and proceed with ordering a treadmill Myoview stress test that it may convert to Lexiscan but need to see his symptoms this understands the dilatations.

## 2022-03-16 NOTE — Assessment & Plan Note (Signed)
He is taking fish oil and high-dose atorvastatin His LDL was good at 71 and his triglycerides are pretty much at target 150.  Total was 126 and HDL not unexpectedly is low.  He is therefore on fish oil => strongly consider Vascepa if triglycerides increase again.  Needs to work on diet, exercise and weight loss to help with some management of his lipids.  Thankfully, he has not yet been diagnosed with diabetes

## 2022-03-16 NOTE — Assessment & Plan Note (Signed)
Initially had an aborted STEMI that led to his CABG then subsequently had a complete STEMI with occlusion of the RIMA graft to the RCA system with a known RCA occlusion. His EF was read as 45-50 which probably makes more sense given the fact that his RCA system was not revascularized.  I do not think is not much different than his previous echoes. No angina or CHF symptoms.  Also on stable meds.

## 2022-03-21 ENCOUNTER — Ambulatory Visit (HOSPITAL_COMMUNITY): Payer: 59 | Attending: Cardiology

## 2022-03-21 DIAGNOSIS — Z024 Encounter for examination for driving license: Secondary | ICD-10-CM | POA: Diagnosis present

## 2022-03-21 DIAGNOSIS — I255 Ischemic cardiomyopathy: Secondary | ICD-10-CM | POA: Diagnosis not present

## 2022-03-21 DIAGNOSIS — I2510 Atherosclerotic heart disease of native coronary artery without angina pectoris: Secondary | ICD-10-CM | POA: Diagnosis present

## 2022-03-21 DIAGNOSIS — Z951 Presence of aortocoronary bypass graft: Secondary | ICD-10-CM | POA: Diagnosis present

## 2022-03-21 DIAGNOSIS — E785 Hyperlipidemia, unspecified: Secondary | ICD-10-CM | POA: Insufficient documentation

## 2022-03-21 DIAGNOSIS — I2129 ST elevation (STEMI) myocardial infarction involving other sites: Secondary | ICD-10-CM | POA: Diagnosis present

## 2022-03-21 DIAGNOSIS — I10 Essential (primary) hypertension: Secondary | ICD-10-CM | POA: Diagnosis not present

## 2022-03-21 LAB — MYOCARDIAL PERFUSION IMAGING
Angina Index: 0
Duke Treadmill Score: 5
Estimated workload: 7
Exercise duration (min): 5 min
LV dias vol: 134 mL (ref 62–150)
LV sys vol: 67 mL
MPHR: 159 {beats}/min
Nuc Stress EF: 50 %
Peak HR: 146 {beats}/min
Percent HR: 91 %
RPE: 19
Rest HR: 67 {beats}/min
Rest Nuclear Isotope Dose: 10.1 mCi
SDS: 9
SRS: 5
SSS: 15
ST Depression (mm): 0 mm
Stress Nuclear Isotope Dose: 31.3 mCi
TID: 0.97

## 2022-03-21 MED ORDER — TECHNETIUM TC 99M TETROFOSMIN IV KIT
10.1000 | PACK | Freq: Once | INTRAVENOUS | Status: AC | PRN
  Administered 2022-03-21: 10.1 via INTRAVENOUS

## 2022-03-21 MED ORDER — TECHNETIUM TC 99M TETROFOSMIN IV KIT
31.3000 | PACK | Freq: Once | INTRAVENOUS | Status: AC | PRN
  Administered 2022-03-21: 31.3 via INTRAVENOUS

## 2022-03-21 MED ORDER — REGADENOSON 0.4 MG/5ML IV SOLN: 0.4000 mg | Freq: Once | INTRAVENOUS | Status: DC

## 2022-03-24 ENCOUNTER — Encounter: Payer: Self-pay | Admitting: Cardiology

## 2022-03-28 NOTE — Progress Notes (Unsigned)
Cardiology Office Note:    Date:  03/29/2022   ID:  Parker Sellers, DOB [birth year], MRN [id number]  PCP:  Parker Spencer, FNP [facility] Cardiologist: Bryan Lemma, MD   Reason for visit: Follow-up  History of Present Illness:    Parker Sellers is a 61 y.o. male with a hx of CAD status post CABG x5 (LIMA-LAD, SeqfRIMA-OM1-OM2, SeqSVG-rPD-rPL) in 2018, hypertension, hyperlipidemia, sleep apnea on CPAP, obesity, former tobacco use.  He saw Dr. Herbie Sellers earlier this month for evaluation.  He was noted to have mild baseline dyspnea.  No chest pain.  Under stress test showing prior MI with minimal peri-infarct ischemia -not high risk.    Today, patient feels well.  He denies chest pain and leg swelling.  He was able to reach target heart rate during his treadmill stress test.  He denies shortness of breath with his activities of daily living/going up a flight of stairs.  No bleeding issues with aspirin and Plavix.  He is compliant with CPAP.  He quit smoking 30 years ago.     Past Medical History:  Diagnosis Date   Coronary artery disease involving native coronary artery of native heart with unstable angina pectoris (HCC) 05/22/2017   Cath 05/12/2017 (aborted inferior STEMI) - Ost LM 55%, d (apical) LAD 100%, D1 100% (chronic), Om1 70%, mRCA 75% ulcerated lesion (culprit) - dRCA 55%, ostrPDA 60% & rPDA 60%. RPAV 70%, PAV2 95%. Difficult Radial Access - tortuous innominate artery.;; -->  Status post recurrent MI with occlusion of graft to the right system with negative RCA occlusion   Hyperlipidemia    Hypertension    Ischemic cardiomyopathy 05/12/2017   EF by LV Gram 35-45%; Post MI Echo 45%, OR TEE 35-45%.  Gr 2 DD. -->  Initially resolved as of September 24, 2017.  EF 50-55%.   S/P CABG x 5 05/14/2017   LIMA-LAD, seqSVG-OM1-OM2, free r-Rad-rPDA   Sleep apnea    home CPAP   ST elevation myocardial infarction (STEMI) of lateral wall, initial episode of care [facility]) 05/12/2017    Recurrent MI -February 2019 [facility]) -native RCA occluded along with vein graft to RPDA-RPL unable to wire.   Tobacco use 05/12/2017    Past Surgical History:  Procedure Laterality Date   CORONARY ARTERY BYPASS GRAFT N/A 05/14/2017   Procedure: CORONARY ARTERY BYPASS GRAFTING (CABG)/EVH x 5 five using bilateral internal mammary arteries and left saphenous vein.;  Surgeon: Parker Slot, MD;  Location: [facility] OR;  Service: Open Heart Surgery(::) LIMA-LAD, seqSVG-OM1-OM2, free r-Rad-rPDA   HERNIA REPAIR     KNEE SURGERY Left    Torn meniscus   LEFT HEART CATH AND CORONARY ANGIOGRAPHY N/A 05/12/2017   Procedure: LEFT HEART CATH AND CORONARY ANGIOGRAPHY;  Surgeon: Parker Lex, MD;  Location: [facility] INVASIVE CV LAB: MV CAD - ostLM 55%, dLAD 100%, D1 100%, OM 1 70%, mRCA 75%-dRCA 55%, ostPDA 60% - PDA 60^, PL1 70%, PK2 95%.  Mod-Severe LV Dysfunction - ~EF 35-45%   LEFT HEART CATH AND CORS/GRAFTS ANGIOGRAPHY  10/30/2017   [facility], [place] : LIMA-LAD patent, graft to OM-OM patent (in chart listed as SVG but this is actually the free RIMA), occluded SVG-RPDA-RPL -->  Native RCA is totally closed.  EF 40%.  ->  Attempt intervention on the RIMA  as it comes off the innominate artery was obviously unsuccessful as the vessel have been removed and attached to the aorta.   TEE WITHOUT CARDIOVERSION N/A  05/14/2017   Procedure: TRANSESOPHAGEAL ECHOCARDIOGRAM (TEE);  Surgeon: Parker Slot, MD;  Location: [facility] OR;  Service: Open Heart Surgery: Mod Concentric LVH. EF ~35-40%.  Inferior hypokinesis & inferolateral hypokinesis. Dilated MV & TV annuli with only trace MR/TR.  Mild RV dilation with moderately reduced function.   TRANSTHORACIC ECHOCARDIOGRAM  05/13/2017   Mod LVH. Mildly reduced LVED ~45-50%. Akinesis of inferolateral & inferoseptal walls. Gr 2 DD. Mild Ascending Aortic dilation ~3.8 cm.   TRANSTHORACIC ECHOCARDIOGRAM  09/2017   a) Mild LV dilation with mild  increased thickness (mild LVH).  Low-normal function with a EF of 50-55%.  No RWMA. --> b) 10/2017: [facility], [place] =>( in setting of  Inf STEMI w/ TO RIMA-rPD-PL & known RCA CTO & other grafts patent) => EF 50 to 50% with no obvious RWMA, (technically difficult study)   TRANSTHORACIC ECHOCARDIOGRAM  03/23/2021   EF 45 to 50%.  Severe HK of basal inferior wall with mild LV dysfunction.  GRII DD with moderately dilated LA mildly enlarged RA.  Normal RVP-RAP.  Normal AOV, MV.    Current Medications: Current Meds  Medication Sig   aspirin EC 81 MG tablet Take 1 tablet (81 mg total) by mouth daily.   clopidogrel (PLAVIX) 75 MG tablet Take 1 tablet by mouth once daily   colchicine 0.6 MG tablet Take 1.2 mg then 0.6 mg a hour later. Max 1.8 mg/24 hours   lisinopril-hydrochlorothiazide (ZESTORETIC) 20-12.5 MG tablet Take 2 tablets by mouth once daily   metoprolol tartrate (LOPRESSOR) 25 MG tablet TAKE 1 & 1/2 (ONE & ONE-HALF) TABLETS BY MOUTH TWICE DAILY   Multiple Vitamin (MULTIVITAMIN WITH MINERALS) TABS tablet Take 1 tablet by mouth daily.   Omega-3 Fatty Acids (FISH OIL) 1000 MG CAPS Take by mouth.     Allergies:   Patient has no known allergies.   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[employer]  Tobacco Use   Smoking status: Former   Smokeless tobacco: Current    Types: Snuff, Chew  Vaping Use   Vaping Use: Never used  Substance and Sexual Activity   Alcohol use: Yes    Alcohol/week: 2.0 standard drinks of alcohol    Types: 2 Cans of beer per week    Comment: quit as of sept 2018   Drug use: No    Types: Marijuana   Sexual activity: Yes  Other Topics Concern   Not on file  Social History Narrative   Not on file   Social  Determinants of Health   Financial Resource Strain: Not on file  Food Insecurity: Not on file  Transportation Needs: Not on file  Physical Activity: Not on file  Stress: Not on file  Social Connections: Not on file     Family History: The patient's family history includes AAA (abdominal aortic aneurysm) in his father; Alcohol abuse in his father; Cancer in his mother; Coronary artery disease in his brother; Hypertension in his father; Liver disease in his father.  ROS:   Please see the history of present illness.     EKGs/Labs/Other Studies Reviewed:    Recent Labs: 07/25/2021: ALT 27; BUN 19; Creatinine, Ser 1.30; Potassium 4.5; Sodium 139   Recent  Lipid Panel Lab Results  Component Value Date/Time   CHOL 126 01/24/2021 08:37 AM   TRIG 151 (H) 01/24/2021 08:37 AM   HDL 29 (L) 01/24/2021 08:37 AM   LDLCALC 71 01/24/2021 08:37 AM    Physical Exam:    VS:  BP 110/70 (BP Location: Left Arm, Patient Position: Sitting, Cuff Size: Normal)   Pulse 62   Resp 20   Ht 5\' 8"  (1.727 m)   Wt 254 lb 9.6 oz (115.5 kg)   SpO2 97%   BMI 38.71 kg/m    No data found.       Wt Readings from Last 3 Encounters:  03/29/22 254 lb 9.6 oz (115.5 kg)  03/21/22 253 lb (114.8 kg)  03/14/22 253 lb 12.8 oz (115.1 kg)     GEN:  Well nourished, well developed in no acute distress HEENT: Normal NECK: No JVD; No carotid bruits CARDIAC: RRR, no murmurs, rubs, gallops RESPIRATORY:  Clear to auscultation without rales, wheezing or rhonchi  ABDOMEN: Soft, non-tender, non-distended MUSCULOSKELETAL: No edema; No deformity  SKIN: Warm and dry NEUROLOGIC:  Alert and oriented PSYCHIATRIC:  Normal affect     ASSESSMENT AND PLAN   Coronary artery disease, no angina Ischemic cardiomyopathy, euvolemic -Initially had an aborted STEMI that led to his CABG then subsequently had a complete STEMI with occlusion of the RIMA graft to the RCA system with a known RCA occlusion. -CABG x5 (LIMA-LAD,  SeqfRIMA-OM1-OM2, SeqSVG-rPD-rPL) in 2018 -3 out of 5 grafts patent M1 sequential graft occluded to the occluded RCA -Most recent echo with inferior wall hypokinesis, EF 45-50% -Nuclear treadmill stress July 2023 estimates EF 50%, prior MI with mild peri-infarct ischemia --not high risk. 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 -We discussed switching lisinopril HCT to [facility] today --we will need updated renal function first.  He is going to schedule his annual physical with blood work.  He wishes to look up Entresto first and then will call our office when he is ready to make this change. -For now continue lisinopril-HCTZ, metoprolol tartrate, DAPT and high-dose Lipitor.  HTN, well controlled -If we did switch lisinopril HCTZ to Entresto, I think Entresto alone can control his blood pressure. -Good diet and exercise recommended.  Hyperlipidemia -LDL 71 in May 2022.  Continue Lipitor 80 mg daily. -He has annual physical upcoming.  Obstructive sleep apnea -Compliant with CPAP  Dispo: Follow-up with Dr. Ellyn Hack as scheduled in 1 year.  Patient will be in contact with [place] after his lab work to see if were able to switch his lisinopril HCTZ to Entresto.   Medication Adjustments/Labs and Tests Ordered: Current medicines are reviewed at length with the patient today.  Concerns regarding medicines are outlined above.  No orders of the defined types were placed in this encounter.  No orders of the defined types were placed in this encounter.   There are no Patient Instructions on file for this visit.   Signed, Warren Lacy, PA-C  03/29/2022 8:43 AM    [facility]

## 2022-03-29 ENCOUNTER — Encounter: Payer: Self-pay | Admitting: Physician Assistant

## 2022-03-29 ENCOUNTER — Ambulatory Visit (INDEPENDENT_AMBULATORY_CARE_PROVIDER_SITE_OTHER): Payer: 59 | Admitting: Physician Assistant

## 2022-03-29 VITALS — BP 110/70 | HR 62 | Resp 20 | Ht 68.0 in | Wt 254.6 lb

## 2022-03-29 DIAGNOSIS — I2510 Atherosclerotic heart disease of native coronary artery without angina pectoris: Secondary | ICD-10-CM

## 2022-03-29 DIAGNOSIS — I255 Ischemic cardiomyopathy: Secondary | ICD-10-CM

## 2022-03-29 NOTE — Patient Instructions (Addendum)
Medication Instructions:  No changes *If you need a refill on your cardiac medications before your next appointment, please call your pharmacy*   Lab Work: No Labs If you have labs (blood work) drawn today and your tests are completely normal, you will receive your results only by: MyChart Message (if you have MyChart) OR A paper copy in the mail If you have any lab test that is abnormal or we need to change your treatment, we will call you to review the results.   Testing/Procedures: No Testing   Follow-Up: At [facility], you and your health needs are our priority.  As part of our continuing mission to provide you with exceptional heart care, we have created designated Provider Care Teams.  These Care Teams include your primary Cardiologist (physician) and Advanced Practice Providers (APPs -  Physician Assistants and Nurse Practitioners) who all work together to provide you with the care you need, when you need it.  We recommend signing up for the patient portal called "MyChart".  Sign up information is provided on this After Visit Summary.  MyChart is used to connect with patients for Virtual Visits (Telemedicine).  Patients are able to view lab/test results, encounter notes, upcoming appointments, etc.  Non-urgent messages can be sent to your provider as well.   To learn more about what you can do with MyChart, go to [email or web address].    Your next appointment:   1 year(s)  The format for your next appointment:   In Person  Provider:   Bryan Lemma, MD       Important Information About Sugar

## 2022-04-24 ENCOUNTER — Ambulatory Visit (INDEPENDENT_AMBULATORY_CARE_PROVIDER_SITE_OTHER): Payer: 59 | Admitting: Family

## 2022-04-24 ENCOUNTER — Encounter: Payer: Self-pay | Admitting: Family

## 2022-04-24 VITALS — BP 119/80 | HR 70 | Temp 98.5°F | Ht 68.0 in | Wt 255.0 lb

## 2022-04-24 DIAGNOSIS — Z0001 Encounter for general adult medical examination with abnormal findings: Secondary | ICD-10-CM | POA: Diagnosis not present

## 2022-04-24 DIAGNOSIS — Z951 Presence of aortocoronary bypass graft: Secondary | ICD-10-CM | POA: Diagnosis not present

## 2022-04-24 DIAGNOSIS — Z87891 Personal history of nicotine dependence: Secondary | ICD-10-CM

## 2022-04-24 DIAGNOSIS — Z0000 Encounter for general adult medical examination without abnormal findings: Secondary | ICD-10-CM

## 2022-04-24 DIAGNOSIS — I10 Essential (primary) hypertension: Secondary | ICD-10-CM

## 2022-04-24 DIAGNOSIS — Z23 Encounter for immunization: Secondary | ICD-10-CM

## 2022-04-24 DIAGNOSIS — I2510 Atherosclerotic heart disease of native coronary artery without angina pectoris: Secondary | ICD-10-CM | POA: Diagnosis not present

## 2022-04-24 DIAGNOSIS — Z1211 Encounter for screening for malignant neoplasm of colon: Secondary | ICD-10-CM

## 2022-04-24 DIAGNOSIS — M109 Gout, unspecified: Secondary | ICD-10-CM

## 2022-04-24 DIAGNOSIS — E785 Hyperlipidemia, unspecified: Secondary | ICD-10-CM

## 2022-04-24 MED ORDER — LISINOPRIL-HYDROCHLOROTHIAZIDE 20-12.5 MG PO TABS: 2.0000 | ORAL_TABLET | Freq: Every day | ORAL | 3 refills | Status: DC

## 2022-04-24 MED ORDER — ATORVASTATIN CALCIUM 80 MG PO TABS: 80.0000 mg | ORAL_TABLET | Freq: Every day | ORAL | 2 refills | Status: DC

## 2022-04-24 MED ORDER — METOPROLOL TARTRATE 25 MG PO TABS: ORAL_TABLET | ORAL | 3 refills | Status: DC

## 2022-04-24 MED ORDER — CLOPIDOGREL BISULFATE 75 MG PO TABS: 75.0000 mg | ORAL_TABLET | Freq: Every day | ORAL | 3 refills | Status: DC

## 2022-04-24 NOTE — Progress Notes (Signed)
Subjective:    Patient ID: Parker Sellers, male    DOB: 1961/03/09, 61 y.o.   MRN: [id number]  Chief Complaint  Patient presents with   Medical Management of Chronic Issues   PT presents to  the office today for chronic follow up. PT is followed by Cardiologists annually after his MI on 05/12/17.  He has hx gout and has not had a flare up in over a year.  He is morbid obese with a BMI of 38 and HTN and Hyperlipidemia.   Hypertension This is a chronic problem. The current episode started more than 1 year ago. The problem has been resolved since onset. The problem is controlled. Pertinent negatives include no malaise/fatigue, peripheral edema or shortness of breath. Risk factors for coronary artery disease include dyslipidemia, obesity and male gender. The current treatment provides moderate improvement.  Hyperlipidemia This is a chronic problem. The current episode started more than 1 year ago. The problem is controlled. Exacerbating diseases include obesity. Pertinent negatives include no shortness of breath. Current antihyperlipidemic treatment includes statins. The current treatment provides moderate improvement of lipids. Risk factors for coronary artery disease include dyslipidemia, hypertension, male sex and a sedentary lifestyle.      Review of Systems  Constitutional:  Negative for malaise/fatigue.  Respiratory:  Negative for shortness of breath.   All other systems reviewed and are negative.  Family History  Problem Relation Age of Onset   Cancer Mother    Hypertension Father    Liver disease Father    Alcohol abuse Father    AAA (abdominal aortic aneurysm) Father    Coronary artery disease Brother        stent placement age 56   Social History   Socioeconomic History   Marital status: Single    Spouse name: Not on file   Number of children: Not on file   Years of education: Not on file   Highest education level: Not on file  Occupational History    Occupation: Wellsite geologist for [employer]  Tobacco Use   Smoking status: Former   Smokeless tobacco: Current    Types: Snuff, Chew  Vaping Use   Vaping Use: Never used  Substance and Sexual Activity   Alcohol use: Yes    Alcohol/week: 2.0 standard drinks of alcohol    Types: 2 Cans of beer per week    Comment: quit as of sept 2018   Drug use: No    Types: Marijuana   Sexual activity: Yes  Other Topics Concern   Not on file  Social History Narrative   Not on file   Social Determinants of Health   Financial Resource Strain: Not on file  Food Insecurity: Not on file  Transportation Needs: Not on file  Physical Activity: Not on file  Stress: Not on file  Social Connections: Not on file       Objective:   Physical Exam Vitals reviewed.  Constitutional:      General: He is not in acute distress.    Appearance: He is well-developed. He is obese.  HENT:     Head: Normocephalic.     Right Ear: Tympanic membrane normal.     Left Ear: Tympanic membrane normal.  Eyes:     General:        Right eye: No discharge.        Left eye: No discharge.     Pupils: Pupils are equal, round, and reactive to light.  Neck:  Thyroid: No thyromegaly.  Cardiovascular:     Rate and Rhythm: Normal rate and regular rhythm.     Heart sounds: Normal heart sounds. No murmur heard. Pulmonary:     Effort: Pulmonary effort is normal. No respiratory distress.     Breath sounds: Normal breath sounds. No wheezing.  Abdominal:     General: Bowel sounds are normal. There is no distension.     Palpations: Abdomen is soft.     Tenderness: There is no abdominal tenderness.  Musculoskeletal:        General: No tenderness. Normal range of motion.     Cervical back: Normal range of motion and neck supple.  Skin:    General: Skin is warm and dry.     Findings: No erythema or rash.  Neurological:     Mental Status: He is alert and oriented to person, place, and time.     Cranial Nerves:  No cranial nerve deficit.     Deep Tendon Reflexes: Reflexes are normal and symmetric.  Psychiatric:        Behavior: Behavior normal.        Thought Content: Thought content normal.        Judgment: Judgment normal.        BP 119/80   Pulse 70   Temp 98.5 F (36.9 C) (Temporal)   Ht _0  (1.727 m)   Wt 255 lb (115.7 kg)   SpO2 95%   BMI 38.77 kg/m   Assessment & Plan:  Parker Sellers comes in today with chief complaint of Medical Management of Chronic Issues   Diagnosis and orders addressed:  1. Hypertension, unspecified type - metoprolol tartrate (LOPRESSOR) 25 MG tablet; TAKE 1 & 1/2 (ONE & ONE-HALF) TABLETS BY MOUTH TWICE DAILY  Dispense: 90 tablet; Refill: 3 - lisinopril-hydrochlorothiazide (ZESTORETIC) 20-12.5 MG tablet; Take 2 tablets by mouth daily.  Dispense: 180 tablet; Refill: 3 - CMP14+EGFR - CBC with Differential/Platelet  2. S/P CABG x 5 - clopidogrel (PLAVIX) 75 MG tablet; Take 1 tablet (75 mg total) by mouth daily.  Dispense: 90 tablet; Refill: 3 - CMP14+EGFR - CBC with Differential/Platelet  3. Annual physical exam - CMP14+EGFR - CBC with Differential/Platelet - Lipid panel - PSA, total and free - TSH  4. Essential hypertension - CMP14+EGFR - CBC with Differential/Platelet  5. Coronary artery disease involving native coronary artery of native heart without angina pectoris - CMP14+EGFR - CBC with Differential/Platelet  6. Former smoker - CMP14+EGFR - CBC with Differential/Platelet  7. Gout, unspecified cause, unspecified chronicity, unspecified site - CMP14+EGFR - CBC with Differential/Platelet  8. Morbid obesity ([place]) - CMP14+EGFR - CBC with Differential/Platelet  9. Hyperlipidemia with target low density lipoprotein (LDL) cholesterol less than 70 mg/dL - CMP14+EGFR - CBC with Differential/Platelet  10. Colon cancer screening - Cologuard - CMP14+EGFR - CBC with Differential/Platelet   Labs pending Health Maintenance  reviewed Diet and exercise encouraged  Follow up plan: 6 months    Evelina Dun, FNP

## 2022-04-24 NOTE — Patient Instructions (Signed)
Health Maintenance, Male Adopting a healthy lifestyle and getting preventive care are important in promoting health and wellness. Ask your health care provider about: The right schedule for you to have regular tests and exams. Things you can do on your own to prevent diseases and keep yourself healthy. What should I know about diet, weight, and exercise? Eat a healthy diet  Eat a diet that includes plenty of vegetables, fruits, low-fat dairy products, and lean protein. Do not eat a lot of foods that are high in solid fats, added sugars, or sodium. Maintain a healthy weight Body mass index (BMI) is a measurement that can be used to identify possible weight problems. It estimates body fat based on height and weight. Your health care provider can help determine your BMI and help you achieve or maintain a healthy weight. Get regular exercise Get regular exercise. This is one of the most important things you can do for your health. Most adults should: Exercise for at least 150 minutes each week. The exercise should increase your heart rate and make you sweat (moderate-intensity exercise). Do strengthening exercises at least twice a week. This is in addition to the moderate-intensity exercise. Spend less time sitting. Even light physical activity can be beneficial. Watch cholesterol and blood lipids Have your blood tested for lipids and cholesterol at 62 years of age, then have this test every 5 years. You may need to have your cholesterol levels checked more often if: Your lipid or cholesterol levels are high. You are older than 62 years of age. You are at high risk for heart disease. What should I know about cancer screening? Many types of cancers can be detected early and may often be prevented. Depending on your health history and family history, you may need to have cancer screening at various ages. This may include screening for: Colorectal cancer. Prostate cancer. Skin cancer. Lung  cancer. What should I know about heart disease, diabetes, and high blood pressure? Blood pressure and heart disease High blood pressure causes heart disease and increases the risk of stroke. This is more likely to develop in people who have high blood pressure readings or are overweight. Talk with your health care provider about your target blood pressure readings. Have your blood pressure checked: Every 3-5 years if you are 18-39 years of age. Every year if you are 40 years old or older. If you are between the ages of 65 and 75 and are a current or former smoker, ask your health care provider if you should have a one-time screening for abdominal aortic aneurysm (AAA). Diabetes Have regular diabetes screenings. This checks your fasting blood sugar level. Have the screening done: Once every three years after age 45 if you are at a normal weight and have a low risk for diabetes. More often and at a younger age if you are overweight or have a high risk for diabetes. What should I know about preventing infection? Hepatitis B If you have a higher risk for hepatitis B, you should be screened for this virus. Talk with your health care provider to find out if you are at risk for hepatitis B infection. Hepatitis C Blood testing is recommended for: Everyone born from 1945 through 1965. Anyone with known risk factors for hepatitis C. Sexually transmitted infections (STIs) You should be screened each year for STIs, including gonorrhea and chlamydia, if: You are sexually active and are younger than 62 years of age. You are older than 62 years of age and your   health care provider tells you that you are at risk for this type of infection. Your sexual activity has changed since you were last screened, and you are at increased risk for chlamydia or gonorrhea. Ask your health care provider if you are at risk. Ask your health care provider about whether you are at high risk for HIV. Your health care provider  may recommend a prescription medicine to help prevent HIV infection. If you choose to take medicine to prevent HIV, you should first get tested for HIV. You should then be tested every 3 months for as long as you are taking the medicine. Follow these instructions at home: Alcohol use Do not drink alcohol if your health care provider tells you not to drink. If you drink alcohol: Limit how much you have to 0-2 drinks a day. Know how much alcohol is in your drink. In the [place] one drink equals one 12 oz bottle of beer (355 mL), one 5 oz glass of wine (148 mL), or one 1 oz glass of hard liquor (44 mL). Lifestyle Do not use any products that contain nicotine or tobacco. These products include cigarettes, chewing tobacco, and vaping devices, such as e-cigarettes. If you need help quitting, ask your health care provider. Do not use street drugs. Do not share needles. Ask your health care provider for help if you need support or information about quitting drugs. General instructions Schedule regular health, dental, and eye exams. Stay current with your vaccines. Tell your health care provider if: You often feel depressed. You have ever been abused or do not feel safe at home. Summary Adopting a healthy lifestyle and getting preventive care are important in promoting health and wellness. Follow your health care provider's instructions about healthy diet, exercising, and getting tested or screened for diseases. Follow your health care provider's instructions on monitoring your cholesterol and blood pressure. This information is not intended to replace advice given to you by your health care provider. Make sure you discuss any questions you have with your health care provider. Document Revised: 01/10/2021 Document Reviewed: 01/10/2021 Elsevier Patient Education  2023 Elsevier Inc.  

## 2022-04-25 LAB — CBC WITH DIFFERENTIAL/PLATELET
Basophils Absolute: 0.1 10*3/uL (ref 0.0–0.2)
Basos: 1 %
EOS (ABSOLUTE): 0.3 10*3/uL (ref 0.0–0.4)
Eos: 4 %
Hematocrit: 49.3 % (ref 37.5–51.0)
Hemoglobin: 17.2 g/dL (ref 13.0–17.7)
Immature Grans (Abs): 0 10*3/uL (ref 0.0–0.1)
Immature Granulocytes: 0 %
Lymphocytes Absolute: 1.8 10*3/uL (ref 0.7–3.1)
Lymphs: 24 %
MCH: 31.9 pg (ref 26.6–33.0)
MCHC: 34.9 g/dL (ref 31.5–35.7)
MCV: 91 fL (ref 79–97)
Monocytes Absolute: 0.6 10*3/uL (ref 0.1–0.9)
Monocytes: 8 %
Neutrophils Absolute: 4.8 10*3/uL (ref 1.4–7.0)
Neutrophils: 63 %
Platelets: 197 10*3/uL (ref 150–450)
RBC: 5.4 x10E6/uL (ref 4.14–5.80)
RDW: 13.4 % (ref 11.6–15.4)
WBC: 7.7 10*3/uL (ref 3.4–10.8)

## 2022-04-25 LAB — TSH: TSH: 2.17 u[IU]/mL (ref 0.450–4.500)

## 2022-04-25 LAB — LIPID PANEL
Chol/HDL Ratio: 4 ratio (ref 0.0–5.0)
Cholesterol, Total: 121 mg/dL (ref 100–199)
HDL: 30 mg/dL — ABNORMAL LOW (ref >39)
LDL Chol Calc (NIH): 66 mg/dL (ref 0–99)
Triglycerides: 142 mg/dL (ref 0–149)
VLDL Cholesterol Cal: 25 mg/dL (ref 5–40)

## 2022-04-25 LAB — CMP14+EGFR
ALT: 27 IU/L (ref 0–44)
AST: 17 IU/L (ref 0–40)
Albumin/Globulin Ratio: 1.7 (ref 1.2–2.2)
Albumin: 4.4 g/dL (ref 3.9–4.9)
Alkaline Phosphatase: 92 IU/L (ref 44–121)
BUN/Creatinine Ratio: 11 (ref 10–24)
BUN: 14 mg/dL (ref 8–27)
Bilirubin Total: 0.4 mg/dL (ref 0.0–1.2)
CO2: 21 mmol/L (ref 20–29)
Calcium: 9.9 mg/dL (ref 8.6–10.2)
Chloride: 104 mmol/L (ref 96–106)
Creatinine, Ser: 1.33 mg/dL — ABNORMAL HIGH (ref 0.76–1.27)
Globulin, Total: 2.6 g/dL (ref 1.5–4.5)
Glucose: 119 mg/dL — ABNORMAL HIGH (ref 70–99)
Potassium: 4.4 mmol/L (ref 3.5–5.2)
Sodium: 139 mmol/L (ref 134–144)
Total Protein: 7 g/dL (ref 6.0–8.5)
eGFR: 60 mL/min/{1.73_m2} (ref >59)

## 2022-04-25 LAB — PSA, TOTAL AND FREE
PSA, Free Pct: 65 %
PSA, Free: 0.39 ng/mL
Prostate Specific Ag, Serum: 0.6 ng/mL (ref 0.0–4.0)

## 2022-07-24 ENCOUNTER — Ambulatory Visit (INDEPENDENT_AMBULATORY_CARE_PROVIDER_SITE_OTHER): Payer: 59 | Admitting: *Deleted

## 2022-07-24 DIAGNOSIS — Z23 Encounter for immunization: Secondary | ICD-10-CM

## 2022-07-24 NOTE — Progress Notes (Signed)
Shingrix and flu shot given. Pt tolerated well.

## 2022-09-25 ENCOUNTER — Other Ambulatory Visit: Payer: Self-pay | Admitting: Family

## 2022-09-25 DIAGNOSIS — I10 Essential (primary) hypertension: Secondary | ICD-10-CM

## 2022-09-26 MED ORDER — METOPROLOL TARTRATE 25 MG PO TABS: 37.5000 mg | ORAL_TABLET | Freq: Two times a day (BID) | ORAL | 0 refills | Status: DC

## 2022-09-26 NOTE — Telephone Encounter (Signed)
Fax from pharmacy request for 90-d, ins will not cover 30-d refill Refill sent pt has appt on 10/30/22

## 2022-09-26 NOTE — Addendum Note (Signed)
Addended by: Antonietta Barcelona D on: 09/26/2022 03:26 PM   Modules accepted: Orders

## 2022-10-30 ENCOUNTER — Ambulatory Visit: Payer: 59 | Admitting: Family

## 2022-11-27 ENCOUNTER — Encounter: Payer: Self-pay | Admitting: Family

## 2022-11-27 ENCOUNTER — Ambulatory Visit: Payer: 59 | Admitting: Family

## 2022-11-27 VITALS — BP 128/79 | HR 48 | Temp 97.6°F | Ht 68.0 in | Wt 252.8 lb

## 2022-11-27 DIAGNOSIS — I2510 Atherosclerotic heart disease of native coronary artery without angina pectoris: Secondary | ICD-10-CM

## 2022-11-27 DIAGNOSIS — I10 Essential (primary) hypertension: Secondary | ICD-10-CM

## 2022-11-27 DIAGNOSIS — E785 Hyperlipidemia, unspecified: Secondary | ICD-10-CM | POA: Diagnosis not present

## 2022-11-27 DIAGNOSIS — Z951 Presence of aortocoronary bypass graft: Secondary | ICD-10-CM

## 2022-11-27 DIAGNOSIS — Z87891 Personal history of nicotine dependence: Secondary | ICD-10-CM | POA: Diagnosis not present

## 2022-11-27 DIAGNOSIS — I252 Old myocardial infarction: Secondary | ICD-10-CM

## 2022-11-27 NOTE — Patient Instructions (Signed)
Health Maintenance, Male Adopting a healthy lifestyle and getting preventive care are important in promoting health and wellness. Ask your health care provider about: The right schedule for you to have regular tests and exams. Things you can do on your own to prevent diseases and keep yourself healthy. What should I know about diet, weight, and exercise? Eat a healthy diet  Eat a diet that includes plenty of vegetables, fruits, low-fat dairy products, and lean protein. Do not eat a lot of foods that are high in solid fats, added sugars, or sodium. Maintain a healthy weight Body mass index (BMI) is a measurement that can be used to identify possible weight problems. It estimates body fat based on height and weight. Your health care provider can help determine your BMI and help you achieve or maintain a healthy weight. Get regular exercise Get regular exercise. This is one of the most important things you can do for your health. Most adults should: Exercise for at least 150 minutes each week. The exercise should increase your heart rate and make you sweat (moderate-intensity exercise). Do strengthening exercises at least twice a week. This is in addition to the moderate-intensity exercise. Spend less time sitting. Even light physical activity can be beneficial. Watch cholesterol and blood lipids Have your blood tested for lipids and cholesterol at 63 years of age, then have this test every 5 years. You may need to have your cholesterol levels checked more often if: Your lipid or cholesterol levels are high. You are older than 63 years of age. You are at high risk for heart disease. What should I know about cancer screening? Many types of cancers can be detected early and may often be prevented. Depending on your health history and family history, you may need to have cancer screening at various ages. This may include screening for: Colorectal cancer. Prostate cancer. Skin cancer. Lung  cancer. What should I know about heart disease, diabetes, and high blood pressure? Blood pressure and heart disease High blood pressure causes heart disease and increases the risk of stroke. This is more likely to develop in people who have high blood pressure readings or are overweight. Talk with your health care provider about your target blood pressure readings. Have your blood pressure checked: Every 3-5 years if you are 18-39 years of age. Every year if you are 40 years old or older. If you are between the ages of 65 and 75 and are a current or former smoker, ask your health care provider if you should have a one-time screening for abdominal aortic aneurysm (AAA). Diabetes Have regular diabetes screenings. This checks your fasting blood sugar level. Have the screening done: Once every three years after age 45 if you are at a normal weight and have a low risk for diabetes. More often and at a younger age if you are overweight or have a high risk for diabetes. What should I know about preventing infection? Hepatitis B If you have a higher risk for hepatitis B, you should be screened for this virus. Talk with your health care provider to find out if you are at risk for hepatitis B infection. Hepatitis C Blood testing is recommended for: Everyone born from 1945 through 1965. Anyone with known risk factors for hepatitis C. Sexually transmitted infections (STIs) You should be screened each year for STIs, including gonorrhea and chlamydia, if: You are sexually active and are younger than 63 years of age. You are older than 63 years of age and your   health care provider tells you that you are at risk for this type of infection. Your sexual activity has changed since you were last screened, and you are at increased risk for chlamydia or gonorrhea. Ask your health care provider if you are at risk. Ask your health care provider about whether you are at high risk for HIV. Your health care provider  may recommend a prescription medicine to help prevent HIV infection. If you choose to take medicine to prevent HIV, you should first get tested for HIV. You should then be tested every 3 months for as long as you are taking the medicine. Follow these instructions at home: Alcohol use Do not drink alcohol if your health care provider tells you not to drink. If you drink alcohol: Limit how much you have to 0-2 drinks a day. Know how much alcohol is in your drink. In the [place] one drink equals one 12 oz bottle of beer (355 mL), one 5 oz glass of wine (148 mL), or one 1 oz glass of hard liquor (44 mL). Lifestyle Do not use any products that contain nicotine or tobacco. These products include cigarettes, chewing tobacco, and vaping devices, such as e-cigarettes. If you need help quitting, ask your health care provider. Do not use street drugs. Do not share needles. Ask your health care provider for help if you need support or information about quitting drugs. General instructions Schedule regular health, dental, and eye exams. Stay current with your vaccines. Tell your health care provider if: You often feel depressed. You have ever been abused or do not feel safe at home. Summary Adopting a healthy lifestyle and getting preventive care are important in promoting health and wellness. Follow your health care provider's instructions about healthy diet, exercising, and getting tested or screened for diseases. Follow your health care provider's instructions on monitoring your cholesterol and blood pressure. This information is not intended to replace advice given to you by your health care provider. Make sure you discuss any questions you have with your health care provider. Document Revised: 01/10/2021 Document Reviewed: 01/10/2021 Elsevier Patient Education  2023 Elsevier Inc.  

## 2022-11-27 NOTE — Progress Notes (Signed)
Subjective:    Patient ID: Parker Sellers, male    DOB: 04/09/1961, 62 y.o.   MRN: [id number]  Chief Complaint  Patient presents with   Medical Management of Chronic Issues   PT presents to  the office today for chronic follow up. PT is followed by Cardiologists annually after his MI on 05/12/17.   He has hx gout and has not had a flare up in over a year.   He is morbid obese with a BMI of 38 and HTN and Hyperlipidemia.   Hypertension This is a chronic problem. The current episode started more than 1 year ago. The problem has been resolved since onset. The problem is controlled. Pertinent negatives include no malaise/fatigue, peripheral edema or shortness of breath. Risk factors for coronary artery disease include dyslipidemia, male gender and sedentary lifestyle. The current treatment provides moderate improvement. There is no history of chronic renal disease.  Hyperlipidemia This is a chronic problem. The current episode started more than 1 year ago. The problem is controlled. Recent lipid tests were reviewed and are normal. Exacerbating diseases include obesity. He has no history of chronic renal disease. Pertinent negatives include no shortness of breath. Current antihyperlipidemic treatment includes statins. The current treatment provides moderate improvement of lipids. Risk factors for coronary artery disease include dyslipidemia, hypertension, male sex and a sedentary lifestyle.      Review of Systems  Constitutional:  Negative for malaise/fatigue.  Respiratory:  Negative for shortness of breath.   All other systems reviewed and are negative.      Objective:   Physical Exam Vitals reviewed.  Constitutional:      General: He is not in acute distress.    Appearance: He is well-developed. He is obese.  HENT:     Head: Normocephalic.     Right Ear: Tympanic membrane normal.     Left Ear: Tympanic membrane normal.  Eyes:     General:        Right eye: No discharge.         Left eye: No discharge.     Pupils: Pupils are equal, round, and reactive to light.  Neck:     Thyroid: No thyromegaly.  Cardiovascular:     Rate and Rhythm: Normal rate and regular rhythm.     Heart sounds: Normal heart sounds. No murmur heard. Pulmonary:     Effort: Pulmonary effort is normal. No respiratory distress.     Breath sounds: Normal breath sounds. No wheezing.  Abdominal:     General: Bowel sounds are normal. There is no distension.     Palpations: Abdomen is soft.     Tenderness: There is no abdominal tenderness.  Musculoskeletal:        General: No tenderness. Normal range of motion.     Cervical back: Normal range of motion and neck supple.  Skin:    General: Skin is warm and dry.     Findings: No erythema or rash.  Neurological:     Mental Status: He is alert and oriented to person, place, and time.     Cranial Nerves: No cranial nerve deficit.     Deep Tendon Reflexes: Reflexes are normal and symmetric.  Psychiatric:        Behavior: Behavior normal.        Thought Content: Thought content normal.        Judgment: Judgment normal.       BP 128/79   Pulse (!) 48   Temp  97.6 F (36.4 C) (Temporal)   Ht 5\' 8"  (1.727 m)   Wt 252 lb 12.8 oz (114.7 kg)   SpO2 95%   BMI 38.44 kg/m      Assessment & Plan:   Parker Sellers comes in today with chief complaint of Medical Management of Chronic Issues   Diagnosis and orders addressed:  1. Essential hypertension - CMP14+EGFR  2. Morbid obesity ([place]) - CMP14+EGFR  3. Hyperlipidemia with target low density lipoprotein (LDL) cholesterol less than 70 mg/dL - CMP14+EGFR  4. Former smoker - CMP14+EGFR  5. S/P CABG x 5 - CMP14+EGFR  6. Coronary artery disease involving native coronary artery of native heart without angina pectoris - CMP14+EGFR  7. History of ST elevation myocardial infarction (STEMI) - CMP14+EGFR   Labs pending Health Maintenance reviewed Diet and exercise  encouraged  Follow up plan: 6 months    Evelina Dun, FNP

## 2022-11-28 LAB — CMP14+EGFR
ALT: 29 IU/L (ref 0–44)
AST: 17 IU/L (ref 0–40)
Albumin/Globulin Ratio: 1.8 (ref 1.2–2.2)
Albumin: 4.7 g/dL (ref 3.9–4.9)
Alkaline Phosphatase: 99 IU/L (ref 44–121)
BUN/Creatinine Ratio: 14 (ref 10–24)
BUN: 20 mg/dL (ref 8–27)
Bilirubin Total: 0.5 mg/dL (ref 0.0–1.2)
CO2: 21 mmol/L (ref 20–29)
Calcium: 9.9 mg/dL (ref 8.6–10.2)
Chloride: 102 mmol/L (ref 96–106)
Creatinine, Ser: 1.48 mg/dL — ABNORMAL HIGH (ref 0.76–1.27)
Globulin, Total: 2.6 g/dL (ref 1.5–4.5)
Glucose: 113 mg/dL — ABNORMAL HIGH (ref 70–99)
Potassium: 5 mmol/L (ref 3.5–5.2)
Sodium: 139 mmol/L (ref 134–144)
Total Protein: 7.3 g/dL (ref 6.0–8.5)
eGFR: 53 mL/min/{1.73_m2} — ABNORMAL LOW (ref >59)

## 2023-02-13 ENCOUNTER — Other Ambulatory Visit: Payer: Self-pay | Admitting: Family

## 2023-02-13 DIAGNOSIS — I10 Essential (primary) hypertension: Secondary | ICD-10-CM

## 2023-05-07 ENCOUNTER — Other Ambulatory Visit: Payer: Self-pay | Admitting: Family

## 2023-05-07 DIAGNOSIS — I10 Essential (primary) hypertension: Secondary | ICD-10-CM

## 2023-05-15 ENCOUNTER — Telehealth: Payer: Self-pay | Admitting: Cardiology

## 2023-05-15 DIAGNOSIS — I10 Essential (primary) hypertension: Secondary | ICD-10-CM

## 2023-05-15 NOTE — Telephone Encounter (Signed)
*  STAT* If patient is at the pharmacy, call can be transferred to refill team.   1. Which medications need to be refilled? (please list name of each medication and dose if known) Metoprolol   2. Would you like to learn more about the convenience, safety, & potential cost savings by using the [facility]?     3. Are you open to using the [facility] (Type [facility].    4. Which pharmacy/location (including street and city if local pharmacy) is medication to be sent to?  [facility] [place]   5. Do they need a 30 day or 90 day supply? Enough until his appointment 06-19-23- please call today- completely out

## 2023-05-16 MED ORDER — METOPROLOL TARTRATE 25 MG PO TABS: 37.5000 mg | ORAL_TABLET | Freq: Two times a day (BID) | ORAL | 0 refills | Status: DC

## 2023-05-16 NOTE — Telephone Encounter (Signed)
Pt scheduled to see Juanda Crumble, PA, 06/19/23.  Refill for Metoprolol has been sent to [facility].

## 2023-05-20 ENCOUNTER — Other Ambulatory Visit: Payer: Self-pay | Admitting: Family

## 2023-05-20 DIAGNOSIS — Z951 Presence of aortocoronary bypass graft: Secondary | ICD-10-CM

## 2023-05-31 ENCOUNTER — Encounter: Payer: Self-pay | Admitting: Family

## 2023-05-31 ENCOUNTER — Ambulatory Visit: Payer: 59 | Admitting: Family

## 2023-05-31 VITALS — BP 121/73 | HR 54 | Temp 97.9°F | Ht 68.0 in | Wt 259.0 lb

## 2023-05-31 DIAGNOSIS — Z951 Presence of aortocoronary bypass graft: Secondary | ICD-10-CM

## 2023-05-31 DIAGNOSIS — M109 Gout, unspecified: Secondary | ICD-10-CM

## 2023-05-31 DIAGNOSIS — I252 Old myocardial infarction: Secondary | ICD-10-CM

## 2023-05-31 DIAGNOSIS — Z0001 Encounter for general adult medical examination with abnormal findings: Secondary | ICD-10-CM | POA: Diagnosis not present

## 2023-05-31 DIAGNOSIS — I2510 Atherosclerotic heart disease of native coronary artery without angina pectoris: Secondary | ICD-10-CM

## 2023-05-31 DIAGNOSIS — E785 Hyperlipidemia, unspecified: Secondary | ICD-10-CM

## 2023-05-31 DIAGNOSIS — Z0000 Encounter for general adult medical examination without abnormal findings: Secondary | ICD-10-CM

## 2023-05-31 DIAGNOSIS — Z87891 Personal history of nicotine dependence: Secondary | ICD-10-CM

## 2023-05-31 DIAGNOSIS — I10 Essential (primary) hypertension: Secondary | ICD-10-CM

## 2023-05-31 LAB — CMP14+EGFR
ALT: 38 IU/L (ref 0–44)
AST: 22 IU/L (ref 0–40)
Albumin: 4.5 g/dL (ref 3.9–4.9)
Alkaline Phosphatase: 83 IU/L (ref 44–121)
BUN/Creatinine Ratio: 15 (ref 10–24)
BUN: 19 mg/dL (ref 8–27)
Bilirubin Total: 0.5 mg/dL (ref 0.0–1.2)
CO2: 21 mmol/L (ref 20–29)
Calcium: 9.9 mg/dL (ref 8.6–10.2)
Chloride: 102 mmol/L (ref 96–106)
Creatinine, Ser: 1.3 mg/dL — ABNORMAL HIGH (ref 0.76–1.27)
Globulin, Total: 2.8 g/dL (ref 1.5–4.5)
Glucose: 135 mg/dL — ABNORMAL HIGH (ref 70–99)
Potassium: 4.3 mmol/L (ref 3.5–5.2)
Sodium: 138 mmol/L (ref 134–144)
Total Protein: 7.3 g/dL (ref 6.0–8.5)
eGFR: 62 mL/min/{1.73_m2} (ref >59)

## 2023-05-31 LAB — CBC WITH DIFFERENTIAL/PLATELET
Basophils Absolute: 0.1 10*3/uL (ref 0.0–0.2)
Basos: 1 %
EOS (ABSOLUTE): 0.3 10*3/uL (ref 0.0–0.4)
Eos: 4 %
Hematocrit: 51.8 % — ABNORMAL HIGH (ref 37.5–51.0)
Hemoglobin: 18 g/dL — ABNORMAL HIGH (ref 13.0–17.7)
Immature Grans (Abs): 0 10*3/uL (ref 0.0–0.1)
Immature Granulocytes: 0 %
Lymphocytes Absolute: 2.3 10*3/uL (ref 0.7–3.1)
Lymphs: 30 %
MCH: 31.9 pg (ref 26.6–33.0)
MCHC: 34.7 g/dL (ref 31.5–35.7)
MCV: 92 fL (ref 79–97)
Monocytes Absolute: 0.7 10*3/uL (ref 0.1–0.9)
Monocytes: 10 %
Neutrophils Absolute: 4.2 10*3/uL (ref 1.4–7.0)
Neutrophils: 55 %
Platelets: 216 10*3/uL (ref 150–450)
RBC: 5.64 x10E6/uL (ref 4.14–5.80)
RDW: 13 % (ref 11.6–15.4)
WBC: 7.6 10*3/uL (ref 3.4–10.8)

## 2023-05-31 LAB — TSH

## 2023-05-31 LAB — LIPID PANEL
Chol/HDL Ratio: 4.2 ratio (ref 0.0–5.0)
Cholesterol, Total: 114 mg/dL (ref 100–199)
HDL: 27 mg/dL — ABNORMAL LOW (ref >39)
LDL Chol Calc (NIH): 58 mg/dL (ref 0–99)
Triglycerides: 167 mg/dL — ABNORMAL HIGH (ref 0–149)
VLDL Cholesterol Cal: 29 mg/dL (ref 5–40)

## 2023-05-31 LAB — PSA, TOTAL AND FREE

## 2023-05-31 MED ORDER — METOPROLOL TARTRATE 25 MG PO TABS
37.5000 mg | ORAL_TABLET | Freq: Two times a day (BID) | ORAL | 3 refills | Status: DC
Start: 2023-05-31 — End: 2023-06-20

## 2023-05-31 MED ORDER — LISINOPRIL-HYDROCHLOROTHIAZIDE 20-12.5 MG PO TABS
2.0000 | ORAL_TABLET | Freq: Every day | ORAL | 2 refills | Status: DC
Start: 2023-05-31 — End: 2023-11-29

## 2023-05-31 MED ORDER — ATORVASTATIN CALCIUM 80 MG PO TABS: 80.0000 mg | ORAL_TABLET | Freq: Every day | ORAL | 2 refills | Status: DC

## 2023-05-31 MED ORDER — CLOPIDOGREL BISULFATE 75 MG PO TABS
75.0000 mg | ORAL_TABLET | Freq: Every day | ORAL | 2 refills | Status: DC
Start: 2023-05-31 — End: 2023-11-29

## 2023-05-31 NOTE — Patient Instructions (Signed)
Health Maintenance, Male Adopting a healthy lifestyle and getting preventive care are important in promoting health and wellness. Ask your health care provider about: The right schedule for you to have regular tests and exams. Things you can do on your own to prevent diseases and keep yourself healthy. What should I know about diet, weight, and exercise? Eat a healthy diet  Eat a diet that includes plenty of vegetables, fruits, low-fat dairy products, and lean protein. Do not eat a lot of foods that are high in solid fats, added sugars, or sodium. Maintain a healthy weight Body mass index (BMI) is a measurement that can be used to identify possible weight problems. It estimates body fat based on height and weight. Your health care provider can help determine your BMI and help you achieve or maintain a healthy weight. Get regular exercise Get regular exercise. This is one of the most important things you can do for your health. Most adults should: Exercise for at least 150 minutes each week. The exercise should increase your heart rate and make you sweat (moderate-intensity exercise). Do strengthening exercises at least twice a week. This is in addition to the moderate-intensity exercise. Spend less time sitting. Even light physical activity can be beneficial. Watch cholesterol and blood lipids Have your blood tested for lipids and cholesterol at 63 years of age, then have this test every 5 years. You may need to have your cholesterol levels checked more often if: Your lipid or cholesterol levels are high. You are older than 63 years of age. You are at high risk for heart disease. What should I know about cancer screening? Many types of cancers can be detected early and may often be prevented. Depending on your health history and family history, you may need to have cancer screening at various ages. This may include screening for: Colorectal cancer. Prostate cancer. Skin cancer. Lung  cancer. What should I know about heart disease, diabetes, and high blood pressure? Blood pressure and heart disease High blood pressure causes heart disease and increases the risk of stroke. This is more likely to develop in people who have high blood pressure readings or are overweight. Talk with your health care provider about your target blood pressure readings. Have your blood pressure checked: Every 3-5 years if you are 18-39 years of age. Every year if you are 40 years old or older. If you are between the ages of 65 and 75 and are a current or former smoker, ask your health care provider if you should have a one-time screening for abdominal aortic aneurysm (AAA). Diabetes Have regular diabetes screenings. This checks your fasting blood sugar level. Have the screening done: Once every three years after age 45 if you are at a normal weight and have a low risk for diabetes. More often and at a younger age if you are overweight or have a high risk for diabetes. What should I know about preventing infection? Hepatitis B If you have a higher risk for hepatitis B, you should be screened for this virus. Talk with your health care provider to find out if you are at risk for hepatitis B infection. Hepatitis C Blood testing is recommended for: Everyone born from 1945 through 1965. Anyone with known risk factors for hepatitis C. Sexually transmitted infections (STIs) You should be screened each year for STIs, including gonorrhea and chlamydia, if: You are sexually active and are younger than 63 years of age. You are older than 63 years of age and your   health care provider tells you that you are at risk for this type of infection. Your sexual activity has changed since you were last screened, and you are at increased risk for chlamydia or gonorrhea. Ask your health care provider if you are at risk. Ask your health care provider about whether you are at high risk for HIV. Your health care provider  may recommend a prescription medicine to help prevent HIV infection. If you choose to take medicine to prevent HIV, you should first get tested for HIV. You should then be tested every 3 months for as long as you are taking the medicine. Follow these instructions at home: Alcohol use Do not drink alcohol if your health care provider tells you not to drink. If you drink alcohol: Limit how much you have to 0-2 drinks a day. Know how much alcohol is in your drink. In the [place] one drink equals one 12 oz bottle of beer (355 mL), one 5 oz glass of wine (148 mL), or one 1 oz glass of hard liquor (44 mL). Lifestyle Do not use any products that contain nicotine or tobacco. These products include cigarettes, chewing tobacco, and vaping devices, such as e-cigarettes. If you need help quitting, ask your health care provider. Do not use street drugs. Do not share needles. Ask your health care provider for help if you need support or information about quitting drugs. General instructions Schedule regular health, dental, and eye exams. Stay current with your vaccines. Tell your health care provider if: You often feel depressed. You have ever been abused or do not feel safe at home. Summary Adopting a healthy lifestyle and getting preventive care are important in promoting health and wellness. Follow your health care provider's instructions about healthy diet, exercising, and getting tested or screened for diseases. Follow your health care provider's instructions on monitoring your cholesterol and blood pressure. This information is not intended to replace advice given to you by your health care provider. Make sure you discuss any questions you have with your health care provider. Document Revised: 01/10/2021 Document Reviewed: 01/10/2021 Elsevier Patient Education  2024 Elsevier Inc.  

## 2023-05-31 NOTE — Progress Notes (Signed)
Subjective:    Patient ID: Parker Sellers, male    DOB: February 06, 1961, 62 y.o.   MRN: [id number]  Chief Complaint  Patient presents with   Medical Management of Chronic Issues   PT presents to  the office today for  CPE and chronic follow up. PT is followed by Cardiologists annually after his MI on 05/12/17, CAD, HTN, and hyperlipidemia.    He has hx gout and has not had a flare up in over a year.   He is morbid obese with a BMI of 39 and HTN and Hyperlipidemia.  Hypertension This is a chronic problem. The current episode started more than 1 year ago. The problem has been resolved since onset. The problem is controlled. Pertinent negatives include no malaise/fatigue, peripheral edema or shortness of breath. Risk factors for coronary artery disease include dyslipidemia. The current treatment provides moderate improvement.  Hyperlipidemia This is a chronic problem. The current episode started more than 1 year ago. The problem is uncontrolled. Recent lipid tests were reviewed and are high. Exacerbating diseases include obesity. Pertinent negatives include no shortness of breath. Current antihyperlipidemic treatment includes statins. The current treatment provides moderate improvement of lipids. Risk factors for coronary artery disease include dyslipidemia, hypertension, male sex and a sedentary lifestyle.      Review of Systems  Constitutional:  Negative for malaise/fatigue.  Respiratory:  Negative for shortness of breath.   All other systems reviewed and are negative.  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  Tobacco Use   Smoking status: Former   Smokeless tobacco: Current    Types: Snuff, Chew  Vaping Use   Vaping status: Never Used  Substance and Sexual Activity   Alcohol use: Yes    Alcohol/week: 2.0 standard drinks of alcohol    Types: 2 Cans of beer per week    Comment: quit as of sept 2018   Drug use: No    Types: Marijuana   Sexual activity: Yes  Other Topics Concern   Not on file  Social History Narrative   Not on file   Social Determinants of Health   Financial Resource Strain: Not on file  Food Insecurity: Not on file  Transportation Needs: Not on file  Physical Activity: Not on file  Stress: Not on file  Social Connections: Not on file       Objective:   Physical Exam Vitals reviewed.  Constitutional:      General: He is not in acute distress.    Appearance: He is well-developed. He is obese.  HENT:     Head: Normocephalic.     Right Ear: Tympanic membrane normal.     Left Ear: Tympanic membrane normal.  Eyes:     General:        Right eye: No discharge.        Left eye: No discharge.  Pupils: Pupils are equal, round, and reactive to light.  Neck:     Thyroid: No thyromegaly.  Cardiovascular:     Rate and Rhythm: Normal rate and regular rhythm.     Heart sounds: Normal heart sounds. No murmur heard. Pulmonary:     Effort: Pulmonary effort is normal. No respiratory distress.     Breath sounds: Normal breath sounds. No wheezing.  Abdominal:     General: Bowel sounds are normal. There is no distension.     Palpations: Abdomen is soft.     Tenderness: There is no abdominal tenderness.  Musculoskeletal:        General: No tenderness. Normal range of motion.     Cervical back: Normal range of motion and neck supple.  Skin:    General: Skin is warm and dry.     Findings: No erythema or rash.  Neurological:     Mental Status: He is alert and  oriented to person, place, and time.     Cranial Nerves: No cranial nerve deficit.     Deep Tendon Reflexes: Reflexes are normal and symmetric.  Psychiatric:        Behavior: Behavior normal.        Thought Content: Thought content normal.        Judgment: Judgment normal.       BP 121/73   Pulse (!) 54   Temp 97.9 F (36.6 C) (Temporal)   Ht 5\' 8"  (1.727 m)   Wt 259 lb (117.5 kg)   SpO2 95%   BMI 39.38 kg/m      Assessment & Plan:  [name] comes in today with chief complaint of Medical Management of Chronic Issues   Diagnosis and orders addressed:  1. S/P CABG x 5 - clopidogrel (PLAVIX) 75 MG tablet; Take 1 tablet (75 mg total) by mouth daily.  Dispense: 90 tablet; Refill: 2 - CMP14+EGFR - CBC with Differential/Platelet  2. Hypertension, unspecified type - lisinopril-hydrochlorothiazide (ZESTORETIC) 20-12.5 MG tablet; Take 2 tablets by mouth daily.  Dispense: 180 tablet; Refill: 2 - metoprolol tartrate (LOPRESSOR) 25 MG tablet; Take 1.5 tablets (37.5 mg total) by mouth 2 (two) times daily.  Dispense: 135 tablet; Refill: 3 - CMP14+EGFR - CBC with Differential/Platelet  3. Annual physical exam - CMP14+EGFR - CBC with Differential/Platelet - Lipid panel - PSA, total and free - TSH  4. Coronary artery disease involving native coronary artery of native heart without angina pectoris - CMP14+EGFR - CBC with Differential/Platelet - Lipid panel  5. Essential hypertension - CMP14+EGFR - CBC with Differential/Platelet  6. Former smoker - CMP14+EGFR - CBC with Differential/Platelet  7. Gout, unspecified cause, unspecified chronicity, unspecified site - CMP14+EGFR - CBC with Differential/Platelet  8. History of ST elevation myocardial infarction (STEMI) - CMP14+EGFR - CBC with Differential/Platelet  9. Hyperlipidemia with target low density lipoprotein (LDL) cholesterol less than 70 mg/dL - ZOX09+UEAV - CBC with Differential/Platelet - Lipid  panel  10. Morbid obesity ([facility]) - CMP14+EGFR - CBC with Differential/Platelet   Labs pending Health Maintenance reviewed Diet and exercise encouraged  Follow up plan: 6 months    Jannifer Rodney, FNP

## 2023-06-01 LAB — SPECIMEN STATUS REPORT

## 2023-06-01 LAB — HGB A1C W/O EAG: Hgb A1c MFr Bld: 6.6 % — ABNORMAL HIGH (ref 4.8–5.6)

## 2023-06-07 NOTE — Progress Notes (Signed)
Cardiology Office Note:  .   Date:  06/19/2023  ID:  [name], DOB 09-04-1961, MRN [id number] PCP: Junie Spencer, FNP   HeartCare Providers Cardiologist:  Bryan Lemma, MD  History of Present Illness: Parker Sellers Kitchen   Parker Sellers is a 62 y.o. male with a past medical history of coronary artery disease s/p CABG, ischemic cardiomyopathy, hypertension, hyperlipidemia, obesity.  Patient is followed by Dr. Herbie Baltimore and presents today for an annual follow-up appointment.  Per chart review, patient had a STEMI in 05/2017.  Cardiac catheterization 05/12/17 showed at least moderate to severe multivessel disease involving moderate-severe ostial left main disease.  He was referred to CT surgery for evaluation of CABG.  Echocardiogram 05/14/2027 showed EF 45-50% with moderate LVH, grade 2 diastolic dysfunction.  Ultimately underwent CABG x 5 (LIMA-LAD, Seq RIMA-OM1-OM2, SeqSVG-rPD-rPL).  Follow-up echocardiogram in 09/2017 showed EF 50-55%, mild LVH.  In 10/2017, patient had a STEMI while living in [place].  Cardiac catheterization showed CTO of native RCA and CTO of sequential RIMA-PD-RPL.  Most recent echocardiogram from 03/2021 showed EF 45-50% with severe hypokinesis of the basal inferolateral wall, grade 2 diastolic dysfunction, normal RV function.  Patient was last seen by Dr. Herbie Baltimore on 03/14/2022.  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 Underwent Nuclear stress test on 03/21/22 that showed findings consistent with prior myocardial infarction with peri-infarct ischemia. Per discussion between Dr. Herbie Baltimore and reading MD, there was minimal peri-infarct ischemia. Not high rist. He was cleared for DOT physical   On interview, patient reports that he has been doing well from a cardiac standpoint. He has been doing more desk work at his job, and has gained some weight due to being sedentary. Recently purchased a pelaton to stay in shape. Denies chest pain, shortness of breath  on exertion. Denies dizziness, syncope, near syncope. No palpitations, orthopnea, ankle edema.Compliant with medications. 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   Cardiac Studies & Procedures   CARDIAC CATHETERIZATION  CARDIAC CATHETERIZATION 05/12/2017  Narrative Images from the original result were not included.   There is mild to moderate left ventricular systolic dysfunction. The left ventricular ejection fraction is 35-45% by visual estimate.  LV end diastolic pressure is severely elevated. 22-26 mmHg  Ost LM lesion, 55 %stenosed.  Dist LAD lesion, 100 %stenosed. Apical LAD appears to be subtotally occluded  Ost 1st Diag to 1st Diag lesion, 100 %stenosed. Appears to be chronic  1st Mrg lesion, 70 %stenosed. Moderate caliber vessel  Mid RCA lesion, 75 %stenosed. Ulcerated irregular apple core appearing lesion  Dist RCA lesion, 55 %stenosed.  Ost RPDA lesion, 60 %stenosed. RPDA lesion, 60 %stenosed.  Post Atrio-1 lesion (just after PDA takeoff), 70 %stenosed. Post Atrio-2 lesion (just prior to RPL 3), 95 %stenosed.  Very difficult radial access case due to tortuosity of the innominate artery making it very difficult to engage coronaries. Also very difficult to cross the aortic valve.  The patient has at least moderate to severe not severe multivessel disease involving moderate to severe ostial left main disease and moderate severe disease in the RCA is a large sleeping dominant vessel.  Based on the extent of disease, complete revascularization via Coronary Bypass Grafting would likely be the best option for this gentleman.  For now, we need to address his blood pressure and risk factors.  Plan: Admit to step down  unit. We will initially use IV nitroglycerin overnight until he can be started on oral medications Restart IV heparin 8 hours after sheath removal pending troponin  evaluation. Added carvedilol to home medications and uptitrate atorvastatin 80 mg daily.   Recommend rounding team consult CT surgery - will need surgical input to help determine if CABG versus PCI would be his best option.   Bryan Lemma, M.D., M.S. Interventional Cardiologist  Pager # [phone] Phone # [phone] [address] [place], [place] [address]  Findings Coronary Findings Diagnostic  Dominance: Right  Left Main Vessel is large. The lesion is eccentric. The lesion is mildly calcified.  Left Anterior Descending The lesion is eccentric. The lesion is chronically occluded.  First Diagonal Branch Vessel is small in size. The lesion is chronically occluded.  First Septal Branch Vessel is moderate in size.  Second Diagonal Branch Vessel is moderate in size.  Second Septal Branch Vessel is small in size.  Third Diagonal Branch Vessel is small in size.  Third Septal Branch Vessel is small in size.  Left Circumflex The vessel exhibits minimal luminal irregularities.  First Obtuse Marginal Branch Vessel is moderate in size. The lesion is tubular and eccentric.  Lateral Second Obtuse Marginal Branch Vessel is moderate in size.  Right Coronary Artery Vessel is large. There is mild  diffuse disease throughout the vessel. Mild diffuse ectasia as well as mild diffuse narrowings Difficult to engage from radial access due to tortuous innominate artery. Required the use of the guiding catheter wire in place to allow for torquing The vessel is moderately ectatic. The lesion is concentric, irregular and ulcerative. Apple core type lesion The lesion is eccentric and irregular.  Acute Marginal Branch Vessel is small in size.  Right Posterior Descending Artery There is mild disease in the vessel. The lesion is eccentric and irregular. The lesion is segmental and irregular.  Inferior Septal Vessel is small in size.  Right Posterior  Atrioventricular Artery The lesion is eccentric and irregular. The lesion is irregular.  First Right Posterolateral Branch Vessel is small in size.  Third Right Posterolateral Branch Vessel is moderate in size.  Intervention  No interventions have been documented.   STRESS TESTS  MYOCARDIAL PERFUSION IMAGING 03/21/2022  Narrative   Findings are consistent with prior myocardial infarction with peri-infarct ischemia. The study is intermediate risk.   No ST deviation was noted.   LV perfusion is abnormal. There is evidence of ischemia. There is evidence of infarction. Defect 1: There is a large defect with moderate reduction in uptake present in the apical to basal anterior, inferior, inferolateral and inferoseptal location(s) that is partially reversible. There is normal wall motion in the defect area. Consistent with infarction and peri-infarct ischemia.   Left ventricular function is abnormal. Global function is moderately reduced. There were multiple regional abnormalities. Nuclear stress EF: 50 %. The left ventricular ejection fraction is mildly decreased (45-54%). End diastolic cavity size is mildly enlarged. End systolic cavity size is normal.   Prior study not available for comparison.  There is a large fixed defect inferior wall from base to apex, extending to inferoseptum. This is consistent with infarct. There is a mild defect in the inferolateral wall that worsens with stress, consistent with ischemia. Overall suggests infarct with peri-infarct ischemia.   ECHOCARDIOGRAM  ECHOCARDIOGRAM COMPLETE 03/22/2021  Narrative ECHOCARDIOGRAM REPORT    Patient Name:   Parker Sellers Date of Exam: 03/22/2021 Medical Rec #:  [id number]  Height:       68.0 in Accession #:    [id number]        Weight:       252.0 lb Date of Birth:  12/08/1960         BSA:          2.255 m Patient Age:    59 years          BP:           130/78 mmHg Patient Gender: M                 HR:            57 bpm. Exam Location:  Outpatient  Procedure: 2D Echo  Indications:    Cardiomyopathy-Ischemic I25.5  History:        Patient has prior history of Echocardiogram examinations, most recent 09/25/2017. CAD, Prior CABG; Risk Factors:Hypertension, Dyslipidemia and Current Smoker.  Sonographer:    Thurman Coyer RDCS (AE) Referring Phys: [id number] DAVID W HARDING  IMPRESSIONS   1. Severe hypokinesis of the basal inferior wall; overall mild LV dysfunction. 2. Left ventricular ejection fraction, by estimation, is 45 to 50%. The left ventricle has mildly decreased function. The left ventricle demonstrates regional wall motion abnormalities (see scoring diagram/findings for description). Left ventricular diastolic parameters are consistent with Grade II diastolic dysfunction (pseudonormalization). 3. Right ventricular systolic function is normal. The right ventricular size is normal. 4. Left atrial size was moderately dilated. 5. Right atrial size was mildly dilated. 6. The mitral valve is normal in structure. Trivial mitral valve regurgitation. No evidence of mitral stenosis. 7. The aortic valve is tricuspid. Aortic valve regurgitation is not visualized. No aortic stenosis is present. 8. The inferior vena cava is normal in size with greater than 50% respiratory variability, suggesting right atrial pressure of 3 mmHg.  FINDINGS Left Ventricle: Left ventricular ejection fraction, by estimation, is 45 to 50%. The left ventricle has mildly decreased function. The left ventricle demonstrates regional wall motion abnormalities. The left ventricular internal cavity size was normal in size. There is no left ventricular hypertrophy. Left ventricular diastolic parameters are consistent with Grade II diastolic dysfunction (pseudonormalization).  Right Ventricle: The right ventricular size is normal. Right ventricular systolic function is normal.  Left Atrium: Left atrial size was moderately  dilated.  Right Atrium: Right atrial size was mildly dilated.  Pericardium: There is no evidence of pericardial effusion.  Mitral Valve: The mitral valve is normal in structure. Trivial mitral valve regurgitation. No evidence of mitral valve stenosis.  Tricuspid Valve: The tricuspid valve is normal in structure. Tricuspid valve regurgitation is trivial. No evidence of tricuspid stenosis.  Aortic Valve: The aortic valve is tricuspid. Aortic valve regurgitation is not visualized. No aortic stenosis is present.  Pulmonic Valve: The pulmonic valve was normal in structure. Pulmonic valve regurgitation is not visualized. No evidence of pulmonic stenosis.  Aorta: The aortic root is normal in size and structure.  Venous: The inferior vena cava is normal in size with greater than 50% respiratory variability, suggesting right atrial pressure of 3 mmHg.  IAS/Shunts: No atrial level shunt detected by color flow Doppler.  Additional Comments: Severe hypokinesis of the basal inferior wall; overall mild LV dysfunction.   LEFT VENTRICLE PLAX 2D LVIDd:         5.30 cm  Diastology LVIDs:         3.60 cm  LV e' medial:    7.01 cm/s LV PW:  1.00 cm  LV E/e' medial:  12.9 LV IVS:        1.00 cm  LV e' lateral:   8.94 cm/s LVOT diam:     2.40 cm  LV E/e' lateral: 10.1 LV SV:         86 LV SV Index:   38 LVOT Area:     4.52 cm   RIGHT VENTRICLE RV S prime:     8.76 cm/s TAPSE (M-mode): 1.2 cm  LEFT ATRIUM             Index       RIGHT ATRIUM           Index LA diam:        5.00 cm 2.22 cm/m  RA Area:     26.30 cm LA Vol (A2C):   93.5 ml 41.47 ml/m RA Volume:   90.90 ml  40.32 ml/m LA Vol (A4C):   95.5 ml 42.36 ml/m LA Biplane Vol: 97.8 ml 43.38 ml/m AORTIC VALVE LVOT Vmax:   82.80 cm/s LVOT Vmean:  52.900 cm/s LVOT VTI:    0.189 m  AORTA Ao Root diam: 3.10 cm Ao Asc diam:  3.60 cm  MITRAL VALVE MV Area (PHT): 3.85 cm    SHUNTS MV Decel Time: 197 msec    Systemic VTI:   0.19 m MV E velocity: 90.10 cm/s  Systemic Diam: 2.40 cm MV A velocity: 73.40 cm/s MV E/A ratio:  1.23  Olga Millers MD Electronically signed by Olga Millers MD Signature Date/Time: 03/22/2021/10:46:34 AM    Final   TEE  ECHO TEE 05/14/2017  Interpretation Summary  Left ventricle: Normal cavity size. Concentric hypertrophy of moderate severity. LV systolic function is moderately reduced with an EF of 35-40%. Wall motion is abnormal. Inferior wall motion is hypokinetic. Inferolateral wall motion is hypokinetic.  Aortic valve: The valve is trileaflet. Mild valve thickening present. No stenosis.  Right ventricle: Normal wall thickness. Cavity is mildly dilated. Mildly reduced systolic function.  Tricuspid valve: Valve has a dilated annulus. Trace regurgitation. The tricuspid valve regurgitation jet is central.  Mitral valve: Dilated mitral annulus. No leaflet thickening and calcification present. Mild mitral annular calcification. Trace regurgitation.  Pulmonic valve: Trace regurgitation.  Septum: No Patent Foramen Ovale present.  Left atrium: Patent foramen ovale not present.  Right ventricle: Cavity is mildly dilated. Moderately reduced systolic function.  Left ventricle: Normal cavity size.            Risk Assessment/Calculations:             Physical Exam:   VS:  BP 120/82 (BP Location: Right Arm, Patient Position: Sitting, Cuff Size: Normal)   Pulse 79   Ht 5\' 8"  (1.727 m)   Wt 264 lb 6.4 oz (119.9 kg)   SpO2 95%   BMI 40.20 kg/m    Wt Readings from Last 3 Encounters:  06/19/23 264 lb 6.4 oz (119.9 kg)  05/31/23 259 lb (117.5 kg)  11/27/22 252 lb 12.8 oz (114.7 kg)    GEN: Well nourished, well developed in no acute distress. Sitting comfortably on the exam table  NECK: No JVD CARDIAC: RRR, no murmurs, rubs, gallops. Radial pulses 2+ bilaterally  RESPIRATORY:  Clear to auscultation without rales, wheezing or rhonchi. Normal work of breathing on room air    ABDOMEN: Soft, non-tender, non-distended EXTREMITIES:  No edema in BLE; No deformity   ASSESSMENT AND PLAN: .    CAD s/p CABG - Previously had CABG x5  in 2018  - Later had a STEMI in [place] in 2019- cath at that time showed occluded native RCA and occluded sequential RIMA-PD-rPL  - Nuclear stress test from 03/2022 showed infarction with minimal peri-infarct ischemia. Study was not high risk  - Patient denies chest pain, DOE  - Continue ASA, plavix  - Continue lipitor - Continue metoprolol tartate 37.5 mg BID   Ischemic Cardiomyopathy  - Most recent echocardiogram from 03/2021 showed EF 45-50% with regional wall motion abnormalities, grade II DD - Continue metoprolol tartrate 25 mg BID  - Continue lisinopril-hydrochlorothiazide 50-25 mg daily - Patient euvolemic on exam  - Discussed starting SGLT2i, but patient would prefer to hold off for now  - Ordered echocardiogram. If EF remains reduced, plan to titrate GDMT   HTN  - BP well controlled on current medications - Continue metoprolol, lisinopril-hydrochlorothiazide  - Creatinine 1.3 and K 4.3 in 05/2023   HLD  -Lipid panel from 05/2023 showed LDL 58, HDL 27, triglycerides 167, total cholesterol 113  - Continue lipitor 80 mg aily   Dispo: Follow up in 6 months   Signed, Jonita Albee, PA-C

## 2023-06-19 ENCOUNTER — Other Ambulatory Visit: Payer: Self-pay | Admitting: Family

## 2023-06-19 ENCOUNTER — Encounter: Payer: Self-pay | Admitting: Cardiology

## 2023-06-19 ENCOUNTER — Ambulatory Visit: Payer: 59 | Attending: Cardiology | Admitting: Cardiology

## 2023-06-19 VITALS — BP 120/82 | HR 79 | Ht 68.0 in | Wt 264.4 lb

## 2023-06-19 DIAGNOSIS — I10 Essential (primary) hypertension: Secondary | ICD-10-CM | POA: Diagnosis not present

## 2023-06-19 DIAGNOSIS — I255 Ischemic cardiomyopathy: Secondary | ICD-10-CM

## 2023-06-19 NOTE — Patient Instructions (Addendum)
Medication Instructions:  No Changes *If you need a refill on your cardiac medications before your next appointment, please call your pharmacy*   Lab Work: No Labs   Testing/Procedures:  Your physician has requested that you have an echocardiogram. Echocardiography is a painless test that uses sound waves to create images of your heart. It provides your doctor with information about the size and shape of your heart and how well your heart's chambers and valves are working. This procedure takes approximately one hour. There are no restrictions for this procedure. Please do NOT wear cologne, perfume, aftershave, or lotions (deodorant is allowed). Please arrive 15 minutes prior to your appointment time.    Follow-Up: At [facility], you and your health needs are our priority.  As part of our continuing mission to provide you with exceptional heart care, we have created designated Provider Care Teams.  These Care Teams include your primary Cardiologist (physician) and Advanced Practice Providers (APPs -  Physician Assistants and Nurse Practitioners) who all work together to provide you with the care you need, when you need it.  We recommend signing up for the patient portal called "MyChart".  Sign up information is provided on this After Visit Summary.  MyChart is used to connect with patients for Virtual Visits (Telemedicine).  Patients are able to view lab/test results, encounter notes, upcoming appointments, etc.  Non-urgent messages can be sent to your provider as well.   To learn more about what you can do with MyChart, go to [email or web address].    Your next appointment:   6 month(s)  Provider:   Any APP

## 2023-07-18 ENCOUNTER — Telehealth: Payer: Self-pay

## 2023-07-18 ENCOUNTER — Ambulatory Visit (HOSPITAL_COMMUNITY): Payer: 59 | Attending: Cardiology

## 2023-07-18 DIAGNOSIS — I255 Ischemic cardiomyopathy: Secondary | ICD-10-CM | POA: Insufficient documentation

## 2023-07-18 LAB — ECHOCARDIOGRAM COMPLETE
Area-P 1/2: 4.29 cm2
S' Lateral: 4 cm

## 2023-07-18 MED ORDER — PERFLUTREN LIPID MICROSPHERE
1.0000 mL | INTRAVENOUS | Status: AC | PRN
Start: 2023-07-18 — End: 2023-07-18
  Administered 2023-07-18: 2 mL via INTRAVENOUS

## 2023-07-18 NOTE — Telephone Encounter (Signed)
Left message to call back  

## 2023-07-18 NOTE — Telephone Encounter (Signed)
-----   Message from Jonita Albee sent at 07/18/2023  2:36 PM EST ----- Please tell patient that his echocardiogram showed EF (pumping function of the heart) was low end of normal at 50-55%. There were no wall motion abnormalities. Normal RV function. Normal pulmonary artery systolic pressure. No significant valvular abnormalities. Overall, good result! No changes to treatment plan at this time   Thanks KJ

## 2023-07-20 NOTE — Telephone Encounter (Signed)
Left message to call back  

## 2023-07-24 NOTE — Telephone Encounter (Signed)
Called patient advised of below they verbalized understanding.

## 2023-07-30 ENCOUNTER — Ambulatory Visit (INDEPENDENT_AMBULATORY_CARE_PROVIDER_SITE_OTHER): Payer: 59

## 2023-07-30 DIAGNOSIS — Z23 Encounter for immunization: Secondary | ICD-10-CM | POA: Diagnosis not present

## 2023-11-09 ENCOUNTER — Encounter: Payer: Self-pay | Admitting: Cardiology

## 2023-11-29 ENCOUNTER — Encounter: Payer: Self-pay | Admitting: Family

## 2023-11-29 ENCOUNTER — Ambulatory Visit: Payer: 59 | Admitting: Family

## 2023-11-29 VITALS — BP 130/77 | HR 62 | Temp 97.1°F | Ht 68.0 in | Wt 258.0 lb

## 2023-11-29 DIAGNOSIS — Z6839 Body mass index (BMI) 39.0-39.9, adult: Secondary | ICD-10-CM

## 2023-11-29 DIAGNOSIS — Z1211 Encounter for screening for malignant neoplasm of colon: Secondary | ICD-10-CM

## 2023-11-29 DIAGNOSIS — I10 Essential (primary) hypertension: Secondary | ICD-10-CM

## 2023-11-29 DIAGNOSIS — I252 Old myocardial infarction: Secondary | ICD-10-CM

## 2023-11-29 DIAGNOSIS — I2510 Atherosclerotic heart disease of native coronary artery without angina pectoris: Secondary | ICD-10-CM | POA: Diagnosis not present

## 2023-11-29 DIAGNOSIS — E1169 Type 2 diabetes mellitus with other specified complication: Secondary | ICD-10-CM

## 2023-11-29 DIAGNOSIS — Z951 Presence of aortocoronary bypass graft: Secondary | ICD-10-CM

## 2023-11-29 DIAGNOSIS — E785 Hyperlipidemia, unspecified: Secondary | ICD-10-CM

## 2023-11-29 LAB — CMP14+EGFR
ALT: 36 IU/L (ref 0–44)
AST: 23 IU/L (ref 0–40)
Albumin: 4.6 g/dL (ref 3.9–4.9)
Alkaline Phosphatase: 97 IU/L (ref 44–121)
BUN/Creatinine Ratio: 17 (ref 10–24)
BUN: 22 mg/dL (ref 8–27)
Bilirubin Total: 0.6 mg/dL (ref 0.0–1.2)
CO2: 23 mmol/L (ref 20–29)
Calcium: 10.2 mg/dL (ref 8.6–10.2)
Chloride: 101 mmol/L (ref 96–106)
Creatinine, Ser: 1.32 mg/dL — ABNORMAL HIGH (ref 0.76–1.27)
Globulin, Total: 2.8 g/dL (ref 1.5–4.5)
Glucose: 126 mg/dL — ABNORMAL HIGH (ref 70–99)
Potassium: 5.1 mmol/L (ref 3.5–5.2)
Sodium: 136 mmol/L (ref 134–144)
Total Protein: 7.4 g/dL (ref 6.0–8.5)
eGFR: 61 mL/min/{1.73_m2} (ref >59)

## 2023-11-29 LAB — BAYER DCA HB A1C WAIVED: HB A1C (BAYER DCA - WAIVED): 6 % — ABNORMAL HIGH (ref 4.8–5.6)

## 2023-11-29 MED ORDER — BLOOD GLUCOSE TEST VI STRP: 1.0000 | ORAL_STRIP | Freq: Three times a day (TID) | 0 refills | Status: AC

## 2023-11-29 MED ORDER — LANCETS MISC. MISC
1.0000 | Freq: Three times a day (TID) | 0 refills | Status: AC
Start: 2023-11-29 — End: 2023-12-29

## 2023-11-29 MED ORDER — LANCET DEVICE MISC: 1.0000 | Freq: Three times a day (TID) | 0 refills | Status: AC

## 2023-11-29 MED ORDER — BLOOD GLUCOSE MONITORING SUPPL DEVI: 1.0000 | Freq: Three times a day (TID) | 0 refills | Status: AC

## 2023-11-29 MED ORDER — CLOPIDOGREL BISULFATE 75 MG PO TABS: 75.0000 mg | ORAL_TABLET | Freq: Every day | ORAL | 2 refills | Status: DC

## 2023-11-29 MED ORDER — LISINOPRIL-HYDROCHLOROTHIAZIDE 20-12.5 MG PO TABS
2.0000 | ORAL_TABLET | Freq: Every day | ORAL | 2 refills | Status: DC
Start: 2023-11-29 — End: 2024-06-02

## 2023-11-29 MED ORDER — ATORVASTATIN CALCIUM 80 MG PO TABS
80.0000 mg | ORAL_TABLET | Freq: Every day | ORAL | 2 refills | Status: AC
Start: 2023-11-29 — End: ?

## 2023-11-29 MED ORDER — METOPROLOL TARTRATE 25 MG PO TABS
ORAL_TABLET | ORAL | 1 refills | Status: DC
Start: 2023-11-29 — End: 2024-06-02

## 2023-11-29 NOTE — Progress Notes (Signed)
 Subjective:    Patient ID: Parker Sellers, male    DOB: 01-12-1961, 63 y.o.   MRN: [id number]  Chief Complaint  Patient presents with   Medical Management of Chronic Issues   PT presents to  the office today for chronic follow up. PT is followed by Cardiologists annually after his MI on 05/12/17, CAD, HTN, and hyperlipidemia.    He has hx gout and has not had a flare up in over a year.   He is morbid obese with a BMI of 39 and HTN and Hyperlipidemia.  Hypertension This is a chronic problem. The current episode started more than 1 year ago. The problem has been resolved since onset. The problem is controlled. Pertinent negatives include no blurred vision, malaise/fatigue, peripheral edema or shortness of breath. Risk factors for coronary artery disease include dyslipidemia, obesity and male gender. The current treatment provides moderate improvement.  Hyperlipidemia This is a chronic problem. The current episode started more than 1 year ago. The problem is controlled. Recent lipid tests were reviewed and are normal. Exacerbating diseases include obesity. Pertinent negatives include no shortness of breath. Current antihyperlipidemic treatment includes statins. The current treatment provides moderate improvement of lipids. Risk factors for coronary artery disease include dyslipidemia, hypertension, male sex and a sedentary lifestyle.  Diabetes He presents for his follow-up diabetic visit. He has type 2 diabetes mellitus. Pertinent negatives for diabetes include no blurred vision and no foot paresthesias. Pertinent negatives for diabetic complications include no heart disease, nephropathy or peripheral neuropathy. Risk factors for coronary artery disease include diabetes mellitus, dyslipidemia, hypertension, male sex and sedentary lifestyle. He is following a generally healthy diet. (Does not check glucose at home)      Review of Systems  Constitutional:  Negative for malaise/fatigue.   Eyes:  Negative for blurred vision.  Respiratory:  Negative for shortness of breath.   All other systems reviewed and are negative.  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[employer]  Tobacco Use   Smoking status: Former   Smokeless tobacco: Current    Types: Snuff, Chew  Vaping Use   Vaping status: Never Used  Substance and Sexual Activity   Alcohol use: Yes    Alcohol/week: 2.0 standard drinks of alcohol    Types: 2 Cans of beer per week    Comment: quit as of sept 2018   Drug use: No    Types: Marijuana   Sexual activity: Yes  Other Topics Concern   Not on file  Social History Narrative   Not on file   Social Drivers of Health   Financial Resource Strain: Not on file  Food Insecurity: Not on file  Transportation Needs: Not on file  Physical Activity: Not on file  Stress: Not on file  Social Connections: Not on file       Objective:   Physical Exam Vitals reviewed.  Constitutional:  General: He is not in acute distress.    Appearance: He is well-developed. He is obese.  HENT:     Head: Normocephalic.     Right Ear: Tympanic membrane normal.     Left Ear: Tympanic membrane normal.  Eyes:     General:        Right eye: No discharge.        Left eye: No discharge.     Pupils: Pupils are equal, round, and reactive to light.  Neck:     Thyroid: No thyromegaly.  Cardiovascular:     Rate and Rhythm: Normal rate and regular rhythm.     Heart sounds: Normal heart sounds. No murmur  heard. Pulmonary:     Effort: Pulmonary effort is normal. No respiratory distress.     Breath sounds: Normal breath sounds. No wheezing.  Abdominal:     General: Bowel sounds are normal. There is no distension.     Palpations: Abdomen is soft.     Tenderness: There is no abdominal tenderness.  Musculoskeletal:        General: No tenderness. Normal range of motion.     Cervical back: Normal range of motion and neck supple.  Skin:    General: Skin is warm and dry.     Findings: No erythema or rash.  Neurological:     Mental Status: He is alert and oriented to person, place, and time.     Cranial Nerves: No cranial nerve deficit.     Deep Tendon Reflexes: Reflexes are normal and symmetric.  Psychiatric:        Behavior: Behavior normal.        Thought Content: Thought content normal.        Judgment: Judgment normal.       BP 130/77   Pulse 62   Temp (!) 97.1 F (36.2 C) (Temporal)   Ht 5\' 8"  (1.727 m)   Wt 258 lb (117 kg)   SpO2 95%   BMI 39.23 kg/m      Assessment & Plan:  Parker Sellers comes in today with chief complaint of Medical Management of Chronic Issues   Diagnosis and orders addressed:  1. S/P CABG x 5 - atorvastatin (LIPITOR) 80 MG tablet; Take 1 tablet (80 mg total) by mouth daily.  Dispense: 90 tablet; Refill: 2 - clopidogrel (PLAVIX) 75 MG tablet; Take 1 tablet (75 mg total) by mouth daily.  Dispense: 90 tablet; Refill: 2 - CMP14+EGFR  2. Hypertension, unspecified type (Primary) - lisinopril-hydrochlorothiazide (ZESTORETIC) 20-12.5 MG tablet; Take 2 tablets by mouth daily.  Dispense: 180 tablet; Refill: 2 - metoprolol tartrate (LOPRESSOR) 25 MG tablet; TAKE 1 & 1/2 (ONE & ONE-HALF) TABLETS BY MOUTH TWICE DAILY  Dispense: 270 tablet; Refill: 1 - CMP14+EGFR  3. Coronary artery disease involving native coronary artery of native heart without angina pectoris - atorvastatin (LIPITOR) 80 MG tablet; Take 1 tablet (80 mg total) by mouth daily.  Dispense:  90 tablet; Refill: 2 - CMP14+EGFR  4. Essential hypertension - CMP14+EGFR  5. History of ST elevation myocardial infarction (STEMI) - atorvastatin (LIPITOR) 80 MG tablet; Take 1 tablet (80 mg total) by mouth daily.  Dispense: 90 tablet; Refill: 2 - CMP14+EGFR  6. Hyperlipidemia with target low density lipoprotein (LDL) cholesterol less than 70 mg/dL  - atorvastatin (LIPITOR) 80 MG tablet; Take 1 tablet (80 mg total) by mouth daily.  Dispense: 90 tablet; Refill: 2 - CMP14+EGFR  7. Morbid obesity (HCC) - CMP14+EGFR  8.  Type 2 diabetes mellitus with other specified complication, without long-term current use of insulin (HCC) Low carb diet  - CMP14+EGFR - Bayer DCA Hb A1c Waived - Blood Glucose Monitoring Suppl DEVI; 1 each by Does not apply route in the morning, at noon, and at bedtime. May substitute to any manufacturer covered by patient's insurance.  Dispense: 1 each; Refill: 0 - Glucose Blood (BLOOD GLUCOSE TEST STRIPS) STRP; 1 each by In Vitro route in the morning, at noon, and at bedtime. May substitute to any manufacturer covered by patient's insurance.  Dispense: 100 strip; Refill: 0 - Lancet Device MISC; 1 each by Does not apply route in the morning, at noon, and at bedtime. May substitute to any manufacturer covered by patient's insurance.  Dispense: 1 each; Refill: 0 - Lancets Misc. MISC; 1 each by Does not apply route in the morning, at noon, and at bedtime. May substitute to any manufacturer covered by patient's insurance.  Dispense: 100 each; Refill: 0 - AMB Referral VBCI Care Management  9. Colon cancer screening - Cologuard - CMP14+EGFR   Labs pending Low carb Health Maintenance reviewed Diet and exercise encouraged  Follow up plan: 6 months    Jannifer Rodney, FNP

## 2023-11-29 NOTE — Patient Instructions (Addendum)

## 2023-11-30 ENCOUNTER — Telehealth: Payer: Self-pay

## 2023-11-30 NOTE — Progress Notes (Signed)
 Care Guide Pharmacy Note  11/30/2023 Name: Parker Sellers MRN: [id number] DOB: 19-Feb-1961  Referred By: Junie Spencer, FNP Reason for referral: Care Coordination (Outreach to schedule with Pharm d )   Parker Sellers is a 63 y.o. year old male who is a primary care patient of Junie Spencer, FNP.  Parker Sellers was referred to the pharmacist for assistance related to: DMII  An unsuccessful telephone outreach was attempted today to contact the patient who was referred to the pharmacy team for assistance with medication management. Additional attempts will be made to contact the patient.  Penne Lash , RMA     [facility]  [facility], [facility] Guide  Direct Dial: [phone]  Website: [email or web address]

## 2023-12-04 NOTE — Progress Notes (Signed)
 Care Guide Pharmacy Note  12/04/2023 Name: Parker Sellers MRN: [id number] DOB: 05-Jan-1961  Referred By: Junie Spencer, FNP Reason for referral: Care Coordination (Outreach to schedule with Pharm d )   Parker Sellers is a 63 y.o. year old male who is a primary care patient of Junie Spencer, FNP.  Parker Sellers was referred to the pharmacist for assistance related to: DMII  A second unsuccessful telephone outreach was attempted today to contact the patient who was referred to the pharmacy team for assistance with medication management. Additional attempts will be made to contact the patient.  Penne Lash , RMA     [facility]  [facility], [facility] Guide  Direct Dial: [phone]  Website: [email or web address]

## 2023-12-10 NOTE — Progress Notes (Signed)
 Care Guide Pharmacy Note  12/10/2023 Name: Parker Sellers MRN: [id number] DOB: 1960/12/14  Referred By: Junie Spencer, FNP Reason for referral: Care Coordination (Outreach to schedule with Pharm d )   Parker Sellers is a 63 y.o. year old male who is a primary care patient of Junie Spencer, FNP.  Parker Sellers was referred to the pharmacist for assistance related to: DMII  A third unsuccessful telephone outreach was attempted today to contact the patient who was referred to the pharmacy team for assistance with medication management. The Population Health team is pleased to engage with this patient at any time in the future upon receipt of referral and should he/she be interested in assistance from the [facility] team.  Penne Lash , RMA     [facility]  [facility], [facility] Guide  Direct Dial: [phone]  Website: [email or web address]

## 2023-12-12 ENCOUNTER — Encounter: Payer: Self-pay | Admitting: Family Medicine

## 2024-04-10 ENCOUNTER — Ambulatory Visit: Admitting: Family

## 2024-04-10 ENCOUNTER — Encounter: Payer: Self-pay | Admitting: Family

## 2024-04-10 VITALS — BP 122/74 | HR 76 | Temp 98.9°F | Ht 68.0 in | Wt 258.0 lb

## 2024-04-10 DIAGNOSIS — L723 Sebaceous cyst: Secondary | ICD-10-CM

## 2024-04-10 NOTE — Progress Notes (Signed)
 Subjective:    Patient ID: Parker Sellers, male    DOB: [birth year], 63 y.o.   MRN: [id number]  Chief Complaint  Patient presents with   Cyst    HPI PT presents to the office today with a cyst on right medial arm that he noticed about 10 years ago. However, over the last 6 weeks he noticed it has been larger and noticed mild tenderness. States it can be aching pain of 5 out 10.    Review of Systems  All other systems reviewed and are negative.   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[employer]  Tobacco Use   Smoking status: Former   Smokeless tobacco: Current    Types: Snuff, Chew  Vaping Use   Vaping status: Never Used  Substance and Sexual Activity   Alcohol use: Yes    Alcohol/week: 2.0 standard drinks of alcohol    Types: 2 Cans of beer per week    Comment: quit as of sept 2018   Drug use: No    Types: Marijuana   Sexual activity: Yes  Other Topics Concern   Not on file  Social History Narrative   Not on file   Social Drivers of Health   Financial Resource Strain: Not on file  Food Insecurity: Not on file  Transportation Needs: Not on file  Physical Activity: Not on file  Stress: Not on file  Social Connections: Not on file   Family History  Problem Relation Age of Onset   Cancer Mother    Hypertension Father    Liver disease Father    Alcohol abuse Father    AAA (abdominal aortic aneurysm) Father    Coronary artery disease Brother        stent placement age 13        Objective:   Physical Exam Vitals reviewed.  Constitutional:      General: He is not in acute distress.    Appearance: He is well-developed.  HENT:     Head: Normocephalic.  Eyes:     General:        Right eye: No discharge.        Left eye:  No discharge.     Pupils: Pupils are equal, round, and reactive to light.  Neck:     Thyroid : No thyromegaly.  Cardiovascular:     Rate and Rhythm: Normal rate and regular rhythm.     Heart sounds: Normal heart sounds. No murmur heard. Pulmonary:     Effort: Pulmonary effort is normal. No respiratory distress.     Breath sounds: Normal breath sounds. No wheezing.  Abdominal:     General: Bowel sounds are normal. There is no distension.     Palpations: Abdomen is soft.     Tenderness: There is no abdominal tenderness.  Musculoskeletal:        General: No tenderness. Normal range of motion.     Cervical back: Normal range of motion and neck supple.  Skin:    General: Skin is warm and dry.     Findings: No erythema or rash.         Comments: cyst on right medial bicep, approx 3X2 cm. No redness, discharge, or  warmth noted   Neurological:     Mental Status: He is alert and oriented to person, place, and time.     Cranial Nerves: No cranial nerve deficit.     Deep Tendon Reflexes: Reflexes are normal and symmetric.  Psychiatric:        Behavior: Behavior normal.        Thought Content: Thought content normal.        Judgment: Judgment normal.    Verbal consent given by patient. Local anesthesia Lidocaine  2% with 2ml Betadine prep Small incision  Thick white discharge removed, sac removed entirely  Dressing applied    BP 122/74   Pulse 76   Temp 98.9 F (37.2 C)   Ht 5' 8 (1.727 m)   Wt 258 lb (117 kg)   SpO2 96%   BMI 39.23 kg/m      Assessment & Plan:  IZZAC ROCKETT comes in today with chief complaint of Cyst   Diagnosis and orders addressed:   1. Sebaceous cyst (Primary) Cyst removed Pt tolerated well Pressure dressing placed. Keep on for next  24 hours. Report any increase pain, redness, discharge, or fevers Keep chronic follow up   Bari Learn, FNP

## 2024-04-10 NOTE — Patient Instructions (Signed)
 Pocket of Fluid in the Skin (Epidermoid Cyst): What to Know  An epidermoid cyst is a pocket of fluid that can form under your skin. It's filled with thick, oily substance that your skin glands make. These cysts occur anywhere on your body. They're usually harmless and don't cause problems unless they get inflamed or infected. What are the causes? An epidermoid cyst may be caused by: A blocked pore or hair follicle. An ingrown hair. This is a hair that curls and re-enters the skin instead of growing straight out of the skin. Skin irritation. Skin injuries. Some conditions that are passed from parent to child (inherited). Human papillomavirus (HPV). This is rare but can cause cysts on the bottom of the feet. Long-term (chronic) sun damage to the skin. What increases the risk? Having acne. Being male. Skin injuries. Being past puberty. Certain rare genetic disorders. What are the signs or symptoms? The only sign of this type of cyst may be a small, painless lump under the skin. When an epidermal cyst ruptures, it may become inflamed. Infections are rare, but symptoms may include: Redness. Inflammation. Tenderness. Warmth. Fever. A bad-smelling, grayish-white substance draining from the cyst. Pus draining from the cyst. How is this diagnosed? This condition is diagnosed with a physical exam. Sometimes, a tissue sample (biopsy) may need to be looked at under a microscope or tested for bacteria. You may be referred to a health care provider who specializes in skin care. This provider is called a dermatologist. How is this treated? If a cyst becomes inflamed, treatment may include: Opening and draining the cyst. Antibiotics. Steroid shots to lessen inflammation. Surgery to take out cysts that are large, painful, or could turn into cancer. Do not try to open or squeeze a cyst yourself. Follow these instructions at home: Medicines Take your medicines only as told. If you were given  antibiotics, take them as told. Do not stop taking them even if you start to feel better. General instructions Keep the area around your cyst clean and dry. Wear loose, dry clothing. Avoid touching your cyst. Check the area around your cyst every day for signs of infection. Check for: Redness, swelling, or pain. Fluid or blood. Warmth. Pus or a bad smell. Keep all follow-up visits to make sure the cyst isn't becoming uncomfortable or infected. Contact a health care provider if: You have any signs of infection. Your cyst doesn't get better or gets worse. You get a cyst that looks different from other cysts you've had. You have a fever. You have redness that spreads from the cyst. This information is not intended to replace advice given to you by your health care provider. Make sure you discuss any questions you have with your health care provider. Document Revised: 04/16/2023 Document Reviewed: 04/06/2023 Elsevier Patient Education  2024 [employer].

## 2024-06-03 ENCOUNTER — Ambulatory Visit: Admitting: Family

## 2024-06-03 ENCOUNTER — Encounter: Payer: Self-pay | Admitting: Family

## 2024-06-03 VITALS — BP 120/79 | HR 61 | Temp 97.3°F | Ht 68.0 in | Wt 256.4 lb

## 2024-06-03 DIAGNOSIS — I2510 Atherosclerotic heart disease of native coronary artery without angina pectoris: Secondary | ICD-10-CM

## 2024-06-03 DIAGNOSIS — Z0001 Encounter for general adult medical examination with abnormal findings: Secondary | ICD-10-CM

## 2024-06-03 DIAGNOSIS — E1169 Type 2 diabetes mellitus with other specified complication: Secondary | ICD-10-CM

## 2024-06-03 DIAGNOSIS — Z0000 Encounter for general adult medical examination without abnormal findings: Secondary | ICD-10-CM

## 2024-06-03 DIAGNOSIS — E785 Hyperlipidemia, unspecified: Secondary | ICD-10-CM

## 2024-06-03 DIAGNOSIS — Z951 Presence of aortocoronary bypass graft: Secondary | ICD-10-CM | POA: Diagnosis not present

## 2024-06-03 DIAGNOSIS — E119 Type 2 diabetes mellitus without complications: Secondary | ICD-10-CM | POA: Insufficient documentation

## 2024-06-03 DIAGNOSIS — I252 Old myocardial infarction: Secondary | ICD-10-CM

## 2024-06-03 DIAGNOSIS — I10 Essential (primary) hypertension: Secondary | ICD-10-CM

## 2024-06-03 LAB — LIPID PANEL

## 2024-06-03 LAB — BAYER DCA HB A1C WAIVED: HB A1C (BAYER DCA - WAIVED): 6.1 % — ABNORMAL HIGH (ref 4.8–5.6)

## 2024-06-03 MED ORDER — LISINOPRIL-HYDROCHLOROTHIAZIDE 20-12.5 MG PO TABS: 2.0000 | ORAL_TABLET | Freq: Every day | ORAL | 2 refills | Status: AC

## 2024-06-03 MED ORDER — METOPROLOL TARTRATE 25 MG PO TABS: ORAL_TABLET | ORAL | 1 refills | Status: AC

## 2024-06-03 MED ORDER — CLOPIDOGREL BISULFATE 75 MG PO TABS: 75.0000 mg | ORAL_TABLET | Freq: Every day | ORAL | 2 refills | Status: AC

## 2024-06-03 NOTE — Progress Notes (Signed)
 Subjective:    Patient ID: Parker Sellers, male    DOB: 1961-02-06, 63 y.o.   MRN: [id number]  Chief Complaint  Patient presents with   Medical Management of Chronic Issues   PT presents to  the office today for CPE.   PT is followed by Cardiologists annually after his MI on 05/12/17, CAD, HTN, and hyperlipidemia.    He has hx gout and has not had a flare up in over a year.   He is morbid obese with a BMI of 38 and HTN and Hyperlipidemia.  Hypertension This is a chronic problem. The current episode started more than 1 year ago. The problem has been resolved since onset. The problem is controlled. Pertinent negatives include no blurred vision, malaise/fatigue, peripheral edema or shortness of breath. Risk factors for coronary artery disease include dyslipidemia, obesity and male gender. The current treatment provides moderate improvement.  Hyperlipidemia This is a chronic problem. The current episode started more than 1 year ago. The problem is controlled. Recent lipid tests were reviewed and are normal. Exacerbating diseases include obesity. Pertinent negatives include no shortness of breath. Current antihyperlipidemic treatment includes statins. The current treatment provides moderate improvement of lipids. Risk factors for coronary artery disease include dyslipidemia, hypertension, male sex, a sedentary lifestyle and obesity.  Diabetes He presents for his follow-up diabetic visit. He has type 2 diabetes mellitus. Pertinent negatives for diabetes include no blurred vision and no foot paresthesias. Pertinent negatives for diabetic complications include no heart disease, nephropathy or peripheral neuropathy. Risk factors for coronary artery disease include diabetes mellitus, dyslipidemia, hypertension, male sex and sedentary lifestyle. He is following a generally healthy diet. (Does not check glucose at home)      Review of Systems  Constitutional:  Negative for malaise/fatigue.  Eyes:   Negative for blurred vision.  Respiratory:  Negative for shortness of breath.   All other systems reviewed and are negative.  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[employer]  Tobacco Use   Smoking status: Former   Smokeless tobacco: Current    Types: Snuff, Chew  Vaping Use   Vaping status: Never Used  Substance and Sexual Activity   Alcohol use: Yes    Alcohol/week: 2.0 standard drinks of alcohol    Types: 2 Cans of beer per week    Comment: quit as of sept 2018   Drug use: No    Types: Marijuana   Sexual activity: Yes  Other Topics Concern   Not on file  Social History Narrative   Not on file   Social Drivers of Health   Financial Resource Strain: Not on file  Food Insecurity: No Food Insecurity (04/10/2024)   Hunger Vital Sign    Worried About Running Out of Food in the Last Year: Never true    Ran Out of Food in the Last Year: Never true  Transportation Needs:  No Transportation Needs (04/10/2024)   PRAPARE - Administrator, Civil Service (Medical): No    Lack of Transportation (Non-Medical): No  Physical Activity: Not on file  Stress: Not on file  Social Connections: Not on file       Objective:   Physical Exam Vitals reviewed.  Constitutional:      General: He is not in acute distress.    Appearance: He is well-developed. He is obese.  HENT:     Head: Normocephalic.     Right Ear: Tympanic membrane normal.     Left Ear: Tympanic membrane normal.  Eyes:     General:         Right eye: No discharge.        Left eye: No discharge.     Pupils: Pupils are equal, round, and reactive to light.  Neck:     Thyroid : No thyromegaly.  Cardiovascular:     Rate and Rhythm: Normal rate and regular rhythm.     Heart sounds: Normal heart sounds. No murmur heard. Pulmonary:     Effort: Pulmonary effort is normal. No respiratory distress.     Breath sounds: Normal breath sounds. No wheezing.  Abdominal:     General: Bowel sounds are normal. There is no distension.     Palpations: Abdomen is soft.     Tenderness: There is no abdominal tenderness.  Musculoskeletal:        General: No tenderness. Normal range of motion.     Cervical back: Normal range of motion and neck supple.  Skin:    General: Skin is warm and dry.     Findings: No erythema or rash.  Neurological:     Mental Status: He is alert and oriented to person, place, and time.     Cranial Nerves: No cranial nerve deficit.     Deep Tendon Reflexes: Reflexes are normal and symmetric.  Psychiatric:        Behavior: Behavior normal.        Thought Content: Thought content normal.        Judgment: Judgment normal.       BP 120/79   Pulse 61   Temp (!) 97.3 F (36.3 C) (Temporal)   Ht 5' 8 (1.727 m)   Wt 256 lb 6.4 oz (116.3 kg)   SpO2 95%   BMI 38.99 kg/m      Assessment & Plan:  JAHMEEK SHIRK comes in today with chief complaint of Medical Management of Chronic Issues   Diagnosis and orders addressed:  1. S/P CABG x 5 - clopidogrel  (PLAVIX ) 75 MG tablet; Take 1 tablet (75 mg total) by mouth daily.  Dispense: 90 tablet; Refill: 2 - CBC with Differential/Platelet - Lipid panel - CMP14+EGFR  2. Hypertension, unspecified type - lisinopril -hydrochlorothiazide  (ZESTORETIC ) 20-12.5 MG tablet; Take 2 tablets by mouth daily.  Dispense: 180 tablet; Refill: 2 - metoprolol  tartrate (LOPRESSOR ) 25 MG tablet; TAKE 1 & 1/2 (ONE & ONE-HALF) TABLETS BY MOUTH TWICE DAILY  Dispense: 270 tablet; Refill:  1 - CBC with Differential/Platelet - Lipid panel - CMP14+EGFR - TSH  3. Type 2 diabetes mellitus with other specified complication, without long-term current use of insulin  (HCC) - CBC with Differential/Platelet - CMP14+EGFR - TSH - Microalbumin / creatinine urine ratio  4. Annual physical exam (Primary)  - Bayer DCA Hb A1c Waived - CBC with Differential/Platelet - CMP14+EGFR - PSA, total and free - TSH  5. Essential hypertension - CBC with Differential/Platelet -  CMP14+EGFR  6. Morbid obesity (HCC)  - CBC with Differential/Platelet - CMP14+EGFR  7. Hyperlipidemia associated with type 2 diabetes mellitus (HCC)  - CBC with Differential/Platelet - Lipid panel - CMP14+EGFR  8. Coronary artery disease involving native coronary artery of native heart without angina pectoris - CBC with Differential/Platelet - CMP14+EGFR  9. History of ST elevation myocardial infarction (STEMI) - CBC with Differential/Platelet - CMP14+EGFR    Labs pending Low carb Continue current medications  Health Maintenance reviewed Diet and exercise encouraged  Follow up plan: 6 months    Bari Learn, FNP

## 2024-06-03 NOTE — Patient Instructions (Signed)
 Health Maintenance, Male  Adopting a healthy lifestyle and getting preventive care are important in promoting health and wellness. Ask your health care provider about:  The right schedule for you to have regular tests and exams.  Things you can do on your own to prevent diseases and keep yourself healthy.  What should I know about diet, weight, and exercise?  Eat a healthy diet    Eat a diet that includes plenty of vegetables, fruits, low-fat dairy products, and lean protein.  Do not eat a lot of foods that are high in solid fats, added sugars, or sodium.  Maintain a healthy weight  Body mass index (BMI) is a measurement that can be used to identify possible weight problems. It estimates body fat based on height and weight. Your health care provider can help determine your BMI and help you achieve or maintain a healthy weight.  Get regular exercise  Get regular exercise. This is one of the most important things you can do for your health. Most adults should:  Exercise for at least 150 minutes each week. The exercise should increase your heart rate and make you sweat (moderate-intensity exercise).  Do strengthening exercises at least twice a week. This is in addition to the moderate-intensity exercise.  Spend less time sitting. Even light physical activity can be beneficial.  Watch cholesterol and blood lipids  Have your blood tested for lipids and cholesterol at 64 years of age, then have this test every 5 years.  You may need to have your cholesterol levels checked more often if:  Your lipid or cholesterol levels are high.  You are older than 64 years of age.  You are at high risk for heart disease.  What should I know about cancer screening?  Many types of cancers can be detected early and may often be prevented. Depending on your health history and family history, you may need to have cancer screening at various ages. This may include screening for:  Colorectal cancer.  Prostate cancer.  Skin cancer.  Lung  cancer.  What should I know about heart disease, diabetes, and high blood pressure?  Blood pressure and heart disease  High blood pressure causes heart disease and increases the risk of stroke. This is more likely to develop in people who have high blood pressure readings or are overweight.  Talk with your health care provider about your target blood pressure readings.  Have your blood pressure checked:  Every 3-5 years if you are 24-52 years of age.  Every year if you are 3 years old or older.  If you are between the ages of 60 and 72 and are a current or former smoker, ask your health care provider if you should have a one-time screening for abdominal aortic aneurysm (AAA).  Diabetes  Have regular diabetes screenings. This checks your fasting blood sugar level. Have the screening done:  Once every three years after age 66 if you are at a normal weight and have a low risk for diabetes.  More often and at a younger age if you are overweight or have a high risk for diabetes.  What should I know about preventing infection?  Hepatitis B  If you have a higher risk for hepatitis B, you should be screened for this virus. Talk with your health care provider to find out if you are at risk for hepatitis B infection.  Hepatitis C  Blood testing is recommended for:  Everyone born from [birth year] through 1966.  Anyone  with known risk factors for hepatitis C.  Sexually transmitted infections (STIs)  You should be screened each year for STIs, including gonorrhea and chlamydia, if:  You are sexually active and are younger than 64 years of age.  You are older than 64 years of age and your health care provider tells you that you are at risk for this type of infection.  Your sexual activity has changed since you were last screened, and you are at increased risk for chlamydia or gonorrhea. Ask your health care provider if you are at risk.  Ask your health care provider about whether you are at high risk for HIV. Your health care provider  may recommend a prescription medicine to help prevent HIV infection. If you choose to take medicine to prevent HIV, you should first get tested for HIV. You should then be tested every 3 months for as long as you are taking the medicine.  Follow these instructions at home:  Alcohol use  Do not drink alcohol if your health care provider tells you not to drink.  If you drink alcohol:  Limit how much you have to 0-2 drinks a day.  Know how much alcohol is in your drink. In the [place] one drink equals one 12 oz bottle of beer (355 mL), one 5 oz glass of wine (148 mL), or one 1 oz glass of hard liquor (44 mL).  Lifestyle  Do not use any products that contain nicotine or tobacco. These products include cigarettes, chewing tobacco, and vaping devices, such as e-cigarettes. If you need help quitting, ask your health care provider.  Do not use street drugs.  Do not share needles.  Ask your health care provider for help if you need support or information about quitting drugs.  General instructions  Schedule regular health, dental, and eye exams.  Stay current with your vaccines.  Tell your health care provider if:  You often feel depressed.  You have ever been abused or do not feel safe at home.  Summary  Adopting a healthy lifestyle and getting preventive care are important in promoting health and wellness.  Follow your health care provider's instructions about healthy diet, exercising, and getting tested or screened for diseases.  Follow your health care provider's instructions on monitoring your cholesterol and blood pressure.  This information is not intended to replace advice given to you by your health care provider. Make sure you discuss any questions you have with your health care provider.  Document Revised: 01/10/2021 Document Reviewed: 01/10/2021  [employer]vier Patient Education  2024 [employer].

## 2024-06-04 LAB — CBC WITH DIFFERENTIAL/PLATELET
Basophils Absolute: 0.1 x10E3/uL (ref 0.0–0.2)
Basos: 1 %
EOS (ABSOLUTE): 0.5 x10E3/uL — ABNORMAL HIGH (ref 0.0–0.4)
Eos: 5 %
Hematocrit: 51.9 % — ABNORMAL HIGH (ref 37.5–51.0)
Hemoglobin: 17.1 g/dL (ref 13.0–17.7)
Immature Grans (Abs): 0 x10E3/uL (ref 0.0–0.1)
Immature Granulocytes: 0 %
Lymphocytes Absolute: 2.1 x10E3/uL (ref 0.7–3.1)
Lymphs: 22 %
MCH: 30.5 pg (ref 26.6–33.0)
MCHC: 32.9 g/dL (ref 31.5–35.7)
MCV: 93 fL (ref 79–97)
Monocytes Absolute: 0.9 x10E3/uL (ref 0.1–0.9)
Monocytes: 9 %
Neutrophils Absolute: 6.2 x10E3/uL (ref 1.4–7.0)
Neutrophils: 63 %
Platelets: 218 x10E3/uL (ref 150–450)
RBC: 5.6 x10E6/uL (ref 4.14–5.80)
RDW: 13.8 % (ref 11.6–15.4)
WBC: 9.8 x10E3/uL (ref 3.4–10.8)

## 2024-06-04 LAB — CMP14+EGFR
ALT: 28 IU/L (ref 0–44)
AST: 18 IU/L (ref 0–40)
Albumin: 4.5 g/dL (ref 3.9–4.9)
Alkaline Phosphatase: 100 IU/L (ref 47–123)
BUN/Creatinine Ratio: 10 (ref 10–24)
BUN: 13 mg/dL (ref 8–27)
Bilirubin Total: 0.6 mg/dL (ref 0.0–1.2)
CO2: 22 mmol/L (ref 20–29)
Calcium: 10 mg/dL (ref 8.6–10.2)
Chloride: 102 mmol/L (ref 96–106)
Creatinine, Ser: 1.31 mg/dL — AB (ref 0.76–1.27)
Globulin, Total: 2.6 g/dL (ref 1.5–4.5)
Glucose: 129 mg/dL — AB (ref 70–99)
Potassium: 4.3 mmol/L (ref 3.5–5.2)
Sodium: 140 mmol/L (ref 134–144)
Total Protein: 7.1 g/dL (ref 6.0–8.5)
eGFR: 61 mL/min/1.73 (ref >59)

## 2024-06-04 LAB — MICROALBUMIN / CREATININE URINE RATIO
Creatinine, Urine: 117.1 mg/dL
Microalb/Creat Ratio: 4 mg/g{creat} (ref 0–29)
Microalbumin, Urine: 4.9 ug/mL

## 2024-06-04 LAB — TSH: TSH: 2 u[IU]/mL (ref 0.450–4.500)

## 2024-06-04 LAB — LIPID PANEL
Cholesterol, Total: 107 mg/dL (ref 100–199)
HDL: 29 mg/dL — AB (ref >39)
LDL CALC COMMENT:: 3.7 ratio (ref 0.0–5.0)
LDL Chol Calc (NIH): 52 mg/dL (ref 0–99)
Triglycerides: 152 mg/dL — AB (ref 0–149)
VLDL Cholesterol Cal: 26 mg/dL (ref 5–40)

## 2024-06-04 LAB — PSA, TOTAL AND FREE
PSA, Free Pct: 58.3 %
PSA, Free: 0.35 ng/mL
Prostate Specific Ag, Serum: 0.6 ng/mL (ref 0.0–4.0)

## 2024-06-05 ENCOUNTER — Ambulatory Visit: Payer: Self-pay | Admitting: Family

## 2024-07-28 ENCOUNTER — Ambulatory Visit (INDEPENDENT_AMBULATORY_CARE_PROVIDER_SITE_OTHER): Payer: Self-pay

## 2024-07-28 DIAGNOSIS — Z23 Encounter for immunization: Secondary | ICD-10-CM

## 2024-12-01 ENCOUNTER — Ambulatory Visit: Payer: Self-pay | Admitting: Family
